# Patient Record
Sex: Female | Born: 1955 | Race: White | Hispanic: No | Smoking: Current every day smoker
Health system: Southern US, Community
[De-identification: ages and names within clinical notes are randomized; demographics above are authoritative.]

## PROBLEM LIST (undated history)

## (undated) DIAGNOSIS — F4310 Post-traumatic stress disorder, unspecified: Secondary | ICD-10-CM

## (undated) DIAGNOSIS — F1010 Alcohol abuse, uncomplicated: Secondary | ICD-10-CM

## (undated) DIAGNOSIS — Z79899 Other long term (current) drug therapy: Secondary | ICD-10-CM

## (undated) DIAGNOSIS — F419 Anxiety disorder, unspecified: Secondary | ICD-10-CM

## (undated) DIAGNOSIS — C799 Secondary malignant neoplasm of unspecified site: Secondary | ICD-10-CM

## (undated) DIAGNOSIS — K219 Gastro-esophageal reflux disease without esophagitis: Secondary | ICD-10-CM

## (undated) DIAGNOSIS — I10 Essential (primary) hypertension: Secondary | ICD-10-CM

## (undated) DIAGNOSIS — F17200 Nicotine dependence, unspecified, uncomplicated: Secondary | ICD-10-CM

## (undated) DIAGNOSIS — F329 Major depressive disorder, single episode, unspecified: Secondary | ICD-10-CM

## (undated) DIAGNOSIS — E785 Hyperlipidemia, unspecified: Secondary | ICD-10-CM

## (undated) DIAGNOSIS — M1990 Unspecified osteoarthritis, unspecified site: Secondary | ICD-10-CM

## (undated) DIAGNOSIS — F32A Depression, unspecified: Secondary | ICD-10-CM

## (undated) HISTORY — DX: Gastro-esophageal reflux disease without esophagitis: K21.9

## (undated) HISTORY — DX: Essential (primary) hypertension: I10

## (undated) HISTORY — DX: Depression, unspecified: F32.A

## (undated) HISTORY — DX: Post-traumatic stress disorder, unspecified: F43.10

## (undated) HISTORY — DX: Anxiety disorder, unspecified: F41.9

## (undated) HISTORY — DX: Major depressive disorder, single episode, unspecified: F32.9

## (undated) HISTORY — DX: Hyperlipidemia, unspecified: E78.5

## (undated) HISTORY — DX: Unspecified osteoarthritis, unspecified site: M19.90

---

## 1992-10-14 HISTORY — PX: TUBAL LIGATION: SHX77

## 1995-10-15 HISTORY — PX: BREAST BIOPSY: SHX20

## 1999-10-25 ENCOUNTER — Other Ambulatory Visit: Admission: RE | Admit: 1999-10-25 | Discharge: 1999-10-25 | Payer: Self-pay | Admitting: Obstetrics & Gynecology

## 2002-08-23 ENCOUNTER — Other Ambulatory Visit: Admission: RE | Admit: 2002-08-23 | Discharge: 2002-08-23 | Payer: Self-pay | Admitting: Obstetrics and Gynecology

## 2003-11-15 ENCOUNTER — Other Ambulatory Visit: Admission: RE | Admit: 2003-11-15 | Discharge: 2003-11-15 | Payer: Self-pay | Admitting: Gynecology

## 2005-10-14 HISTORY — PX: ANTERIOR AND POSTERIOR REPAIR: SHX1172

## 2006-05-12 ENCOUNTER — Other Ambulatory Visit: Admission: RE | Admit: 2006-05-12 | Discharge: 2006-05-12 | Payer: Self-pay | Admitting: *Deleted

## 2006-07-01 ENCOUNTER — Ambulatory Visit (HOSPITAL_COMMUNITY): Admission: RE | Admit: 2006-07-01 | Discharge: 2006-07-02 | Payer: Self-pay | Admitting: *Deleted

## 2007-06-18 ENCOUNTER — Other Ambulatory Visit: Admission: RE | Admit: 2007-06-18 | Discharge: 2007-06-18 | Payer: Self-pay | Admitting: *Deleted

## 2008-10-14 DIAGNOSIS — F4310 Post-traumatic stress disorder, unspecified: Secondary | ICD-10-CM

## 2008-10-14 HISTORY — DX: Post-traumatic stress disorder, unspecified: F43.10

## 2009-06-01 ENCOUNTER — Encounter: Payer: Self-pay | Admitting: Internal Medicine

## 2009-06-01 ENCOUNTER — Ambulatory Visit: Payer: Self-pay | Admitting: Obstetrics and Gynecology

## 2009-06-01 ENCOUNTER — Other Ambulatory Visit: Admission: RE | Admit: 2009-06-01 | Discharge: 2009-06-01 | Payer: Self-pay | Admitting: Obstetrics and Gynecology

## 2009-06-01 ENCOUNTER — Encounter: Payer: Self-pay | Admitting: Obstetrics and Gynecology

## 2009-06-14 ENCOUNTER — Telehealth (INDEPENDENT_AMBULATORY_CARE_PROVIDER_SITE_OTHER): Payer: Self-pay | Admitting: *Deleted

## 2009-07-14 ENCOUNTER — Ambulatory Visit: Payer: Self-pay | Admitting: Internal Medicine

## 2009-07-14 DIAGNOSIS — M1990 Unspecified osteoarthritis, unspecified site: Secondary | ICD-10-CM | POA: Insufficient documentation

## 2009-07-14 DIAGNOSIS — F429 Obsessive-compulsive disorder, unspecified: Secondary | ICD-10-CM | POA: Insufficient documentation

## 2009-07-14 DIAGNOSIS — E785 Hyperlipidemia, unspecified: Secondary | ICD-10-CM | POA: Insufficient documentation

## 2009-07-14 DIAGNOSIS — F411 Generalized anxiety disorder: Secondary | ICD-10-CM | POA: Insufficient documentation

## 2009-07-14 DIAGNOSIS — F4321 Adjustment disorder with depressed mood: Secondary | ICD-10-CM | POA: Insufficient documentation

## 2009-07-14 DIAGNOSIS — F329 Major depressive disorder, single episode, unspecified: Secondary | ICD-10-CM | POA: Insufficient documentation

## 2009-07-14 DIAGNOSIS — K219 Gastro-esophageal reflux disease without esophagitis: Secondary | ICD-10-CM | POA: Insufficient documentation

## 2009-07-14 DIAGNOSIS — F17200 Nicotine dependence, unspecified, uncomplicated: Secondary | ICD-10-CM | POA: Insufficient documentation

## 2009-09-18 ENCOUNTER — Telehealth: Payer: Self-pay | Admitting: Internal Medicine

## 2009-09-21 ENCOUNTER — Ambulatory Visit: Payer: Self-pay | Admitting: Internal Medicine

## 2009-09-21 DIAGNOSIS — R490 Dysphonia: Secondary | ICD-10-CM | POA: Insufficient documentation

## 2009-09-21 DIAGNOSIS — R079 Chest pain, unspecified: Secondary | ICD-10-CM | POA: Insufficient documentation

## 2009-09-21 DIAGNOSIS — F4310 Post-traumatic stress disorder, unspecified: Secondary | ICD-10-CM | POA: Insufficient documentation

## 2009-09-25 LAB — CONVERTED CEMR LAB
ALT: 19 units/L (ref 0–35)
AST: 19 units/L (ref 0–37)
Bilirubin, Direct: 0 mg/dL (ref 0.0–0.3)
CO2: 26 meq/L (ref 19–32)
Calcium: 9.3 mg/dL (ref 8.4–10.5)
Creatinine, Ser: 0.8 mg/dL (ref 0.4–1.2)
GFR calc non Af Amer: 80.18 mL/min (ref >60)
Glucose, Bld: 95 mg/dL (ref 70–99)
HDL: 54.1 mg/dL (ref >39.00)
Hemoglobin, Urine: NEGATIVE
Ketones, ur: NEGATIVE mg/dL
Leukocytes, UA: NEGATIVE
Sodium: 137 meq/L (ref 135–145)
Specific Gravity, Urine: <=1.005 (ref 1.000–1.030)
TSH: 0.84 microintl units/mL (ref 0.35–5.50)
Total Bilirubin: 0.3 mg/dL (ref 0.3–1.2)
Total CHOL/HDL Ratio: 5
Urine Glucose: NEGATIVE mg/dL
Urobilinogen, UA: 0.2 (ref 0.0–1.0)
VLDL: 16.2 mg/dL (ref 0.0–40.0)
Vitamin B-12: 1122 pg/mL — ABNORMAL HIGH (ref 211–911)

## 2009-10-30 ENCOUNTER — Telehealth: Payer: Self-pay | Admitting: Internal Medicine

## 2009-11-07 ENCOUNTER — Ambulatory Visit: Payer: Self-pay | Admitting: Internal Medicine

## 2009-11-07 DIAGNOSIS — L0293 Carbuncle, unspecified: Secondary | ICD-10-CM

## 2009-11-07 DIAGNOSIS — L0292 Furuncle, unspecified: Secondary | ICD-10-CM | POA: Insufficient documentation

## 2009-11-14 ENCOUNTER — Ambulatory Visit: Payer: Self-pay | Admitting: Internal Medicine

## 2010-07-31 ENCOUNTER — Telehealth: Payer: Self-pay | Admitting: Internal Medicine

## 2010-11-15 NOTE — Progress Notes (Signed)
  Phone Note Other Incoming   Caller: [phone] Summary of Call: Pt calling she c/o lower back pain with urinary freq and burning when urinating. She cannot afford to come in . Can pt have antibiotic sent to [facility] in [place]. Please Advise. Initial call taken by: Ami Bullins CMA,  July 31, 2010 10:16 AM  Follow-up for Phone Call        informed pt  Follow-up by: Ami Bullins CMA,  July 31, 2010 11:06 AM    New/Updated Medications: CIPRO 250 MG TAB (CIPROFLOXACIN HCL) Take 1 tablet by mouth morning and night X 5 days Prescriptions: CIPRO 250 MG TAB (CIPROFLOXACIN HCL) Take 1 tablet by mouth morning and night X 5 days  #10 x 1   Entered and Authorized by:   Etta Grandchild MD   Signed by:   Etta Grandchild MD on 07/31/2010   Method used:   Electronically to        [facility] [address] [phone]* (retail)       [address] [place], [place]  [address]       Ph: [phone]       Fax: [phone]   RxID:   [id number] CIPRO 250 MG TAB (CIPROFLOXACIN HCL) Take 1 tablet by mouth morning and night X 5 days  #10 x 1   Entered and Authorized by:   Etta Grandchild MD   Signed by:   Etta Grandchild MD on 07/31/2010   Method used:   Electronically to        [facility]  [address] (retail)       [address]       [place], [place]  [address]       Ph: [phone] or [phone]       Fax: [phone]   RxID:   [id number]

## 2010-11-15 NOTE — Miscellaneous (Signed)
Summary: [facility] D[facility]  [facility]   Imported By: Sherian Rein 11/16/2009 10:24:17  _____________________________________________________________________  External Attachment:    Type:   Image     Comment:   External Document

## 2010-11-15 NOTE — Progress Notes (Signed)
  Phone Note Call from Patient   Caller: [phone] Summary of Call: Patient is requesting a call regarding an apt. Message was unclear.  Initial call taken by: Lamar Sprinkles, CMA,  October 30, 2009 3:44 PM  Follow-up for Phone Call        Spoke with pt, she made apt, does not need anything further from our office.  Follow-up by: Lamar Sprinkles, CMA,  October 31, 2009 6:32 PM

## 2010-11-15 NOTE — Assessment & Plan Note (Signed)
Summary: f/yu appt/cd   Vital Signs:  Patient profile:   55 year old female Weight:      158 pounds Temp:     96.6 degrees F oral Pulse rate:   64 / minute BP sitting:   136 / 84  (left arm)  Vitals Entered By: Tora Perches (November 07, 2009 3:50 PM) CC: f/u Is Patient Diabetic? No   CC:  f/u.  History of Present Illness: The patient presents for a follow up of GERD, depression, hyperlipidemia.  Preventive Screening-Counseling & Management  Alcohol-Tobacco     Smoking Status: current  Current Medications (verified): 1)  Alprazolam 1 Mg Tabs (Alprazolam) .... Three Times A Day 2)  Prozac 40 Mg Caps (Fluoxetine Hcl) .... 60mg  Once Daily 3)  Ranitidine Hcl 150 Mg Caps (Ranitidine Hcl) .Marland Kitchen.. 1 Po Bid 4)  Aspirin 81 Mg  Tbec (Aspirin) .... One By Mouth Every Day 5)  Vitamin D3 1000 Unit  Tabs (Cholecalciferol) .Marland Kitchen.. 1 By Mouth Daily  Allergies (verified): No Known Drug Allergies  Past History:  Past Medical History: Last updated: 09/21/2009 Anxiety Depression/OCD GERD Osteoarthritis GYN-Dr Gottsegen Hyperlipidemia PTSD 2010  Social History: Last updated: 07/14/2009 Occupation: unemployed Widow/Widower Current Smoker Alcohol use-no Drug use-no Regular exercise-no  Family History: Reviewed history from 07/14/2009 and no changes required. F CAD GM breast ca M breast ca  Social History: Reviewed history from 07/14/2009 and no changes required. Occupation: unemployed Widow/Widower Current Smoker Alcohol use-no Drug use-no Regular exercise-no  Review of Systems       The patient complains of chest pain, prolonged cough, and depression.  The patient denies fever.    Physical Exam  General:  NAD Nose:  External nasal examination shows no deformity or inflammation. Nasal mucosa are pink and moist without lesions or exudates. Mouth:  Oral mucosa and oropharynx without lesions or exudates.  Teeth in good repair. Lungs:  CTA Heart:  WNL Abdomen:   S/NT Msk:  No deformity or scoliosis noted of thoracic or lumbar spine.   Neurologic:  No cranial nerve deficits noted. Station and gait are normal. Plantar reflexes are down-going bilaterally. DTRs are symmetrical throughout. Sensory, motor and coordinative functions appear intact. Skin:  1 cm soft furuncle R axilla Psych:  Oriented X3, normally interactive, good eye contact, not agitated, not suicidal, not homicidal, and depressed affect.     Impression & Recommendations:  Problem # 1:  HYPERLIPIDEMIA (ICD-272.4) Assessment Improved  She was not interested in meds, now agreed to try Lovastatin. Better on diet  Her updated medication list for this problem includes:    Lovastatin 20 Mg Tabs (Lovastatin) .Marland Kitchen... 1 by mouth once daily for cholesterol  Problem # 2:  DEPRESSION (ICD-311) Assessment: Unchanged  Her updated medication list for this problem includes:    Alprazolam 1 Mg Tabs (Alprazolam) .Marland Kitchen... Three times a day    Prozac 40 Mg Caps (Fluoxetine hcl) ..... 60mg  once daily  Problem # 3:  GRIEF REACTION (ICD-309.0) Assessment: Unchanged Discussed  Problem # 4:  GERD (ICD-530.81) Assessment: Deteriorated  Her updated medication list for this problem includes:    Ranitidine Hcl 150 Mg Caps (Ranitidine hcl) .Marland Kitchen... 1 po bid Risks of noncompliance with treatment discussed. Compliance encouraged.   Problem # 5:  FURUNCLE (ICD-680.9) R axilla Assessment: New I&D if not well by Friday Abx given  Problem # 6:  PTSD (ICD-309.81) Assessment: Unchanged  Complete Medication List: 1)  Alprazolam 1 Mg Tabs (Alprazolam) .... Three times a day 2)  Prozac 40 Mg Caps (Fluoxetine hcl) .... 60mg  once daily 3)  Ranitidine Hcl 150 Mg Caps (Ranitidine hcl) .Marland Kitchen.. 1 po bid 4)  Aspirin 81 Mg Tbec (Aspirin) .... One by mouth every day 5)  Vitamin D3 1000 Unit Tabs (Cholecalciferol) .Marland Kitchen.. 1 by mouth daily 6)  Lovastatin 20 Mg Tabs (Lovastatin) .Marland Kitchen.. 1 by mouth once daily for cholesterol 7)   Ceftin 500 Mg Tabs (Cefuroxime axetil) .Marland Kitchen.. 1 by mouth bid  Patient Instructions: 1)  Please schedule a follow-up appointment in 3 months. 2)  BMP prior to visit, ICD-9: 3)  Hepatic Panel prior to visit, ICD-9:272.0  995.20 4)  Lipid Panel prior to visit, ICD-9: Prescriptions: CEFTIN 500 MG TABS (CEFUROXIME AXETIL) 1 by mouth bid  #20 x 1   Entered and Authorized by:   Tresa Garter MD   Signed by:   Tresa Garter MD on 11/07/2009   Method used:   Print then Give to Patient   RxID:   [id number] LOVASTATIN 20 MG TABS (LOVASTATIN) 1 by mouth once daily for cholesterol  #30 x 12   Entered and Authorized by:   Tresa Garter MD   Signed by:   Tresa Garter MD on 11/07/2009   Method used:   Print then Give to Patient   RxID:   [id number] RANITIDINE HCL 150 MG CAPS (RANITIDINE HCL) 1 po bid  #60 x 12   Entered and Authorized by:   Tresa Garter MD   Signed by:   Tresa Garter MD on 11/07/2009   Method used:   Print then Give to Patient   RxID:   [id number]

## 2010-11-15 NOTE — Assessment & Plan Note (Signed)
Summary: BOIL IS NOT BETTER/ TOLD TO  COME BACK IN/NWS   Vital Signs:  Patient profile:   55 year old female Weight:      159 pounds Temp:     97.7 degrees F oral Pulse rate:   77 / minute BP sitting:   134 / 84  (left arm)  Vitals Entered By: Tora Perches (November 14, 2009 4:00 PM)  Procedure Note  Incision & Drainage: The patient complains of pain and redness. Onset of lesion: 1 week Consent signed: yes  Procedure # 1: I & D with packing    Size (in cm): 1.0 x 1.0    Region: posterior    Location: R axilla    Comment: Risks including but not limited by incomplete procedure, bleeding, infection, recurrence were discussed with the patient. Consent form was signed. After 1 cm incision was made - 1.5 cc of pus was removed. 2" packing was placed in the wound. Tolerated well. Complicatons - none. Good pain relief following the procedure.     Instrument used: #10 blade    Anesthesia: 1.0 ml 1% lidocaine w/epinephrine  Cleaned and prepped with: alcohol and betadine Wound dressing: neosporin and bulky gauze dressing Instructions: daily dressing changes Additional Instructions: remove packing in the next 2-3 d  CC: boil is not better Is Patient Diabetic? No   CC:  boil is not better.  History of Present Illness: F/u boil in R axilla - not better  Current Medications (verified): 1)  Alprazolam 1 Mg Tabs (Alprazolam) .... Three Times A Day 2)  Prozac 20 Mg Caps (Fluoxetine Hcl) .... Three Times A Day 3)  Ranitidine Hcl 150 Mg Caps (Ranitidine Hcl) .Marland Kitchen.. 1 Po Bid 4)  Aspirin 81 Mg  Tbec (Aspirin) .... One By Mouth Every Day 5)  Vitamin D3 1000 Unit  Tabs (Cholecalciferol) .Marland Kitchen.. 1 By Mouth Daily 6)  Lovastatin 20 Mg Tabs (Lovastatin) .Marland Kitchen.. 1 By Mouth Once Daily For Cholesterol 7)  Ceftin 500 Mg Tabs (Cefuroxime Axetil) .Marland Kitchen.. 1 By Mouth Bid  Allergies (verified): No Known Drug Allergies   Complete Medication List: 1)  Alprazolam 1 Mg Tabs (Alprazolam) .... Three times a  day 2)  Prozac 20 Mg Caps (Fluoxetine hcl) .... Three times a day 3)  Ranitidine Hcl 150 Mg Caps (Ranitidine hcl) .Marland Kitchen.. 1 po bid 4)  Aspirin 81 Mg Tbec (Aspirin) .... One by mouth every day 5)  Vitamin D3 1000 Unit Tabs (Cholecalciferol) .Marland Kitchen.. 1 by mouth daily 6)  Lovastatin 20 Mg Tabs (Lovastatin) .Marland Kitchen.. 1 by mouth once daily for cholesterol 7)  Ceftin 500 Mg Tabs (Cefuroxime axetil) .Marland Kitchen.. 1 by mouth bid  Other Orders: I&D Abscess, Complex (10061)  Patient Instructions: 1)  Finish the ntibiotic 2)  Call if you are not better in a reasonable amount of time or if worse.

## 2011-03-01 NOTE — Op Note (Signed)
Kimberly Adkins, VENTRONE NO.:  [id number]   MEDICAL RECORD NO.:  [id number]          Adkins TYPE:  AMB   LOCATION:  DAY                          FACILITY:  [facility]   PHYSICIAN:  Almedia Balls. Fore, M.D.   DATE OF BIRTH:  July 05, 1956   DATE OF PROCEDURE:  07/01/2006  DATE OF DISCHARGE:                                 OPERATIVE REPORT   PREOPERATIVE DIAGNOSES:  1. Stress urinary incontinence.  2. Cystocele.  3. Rectocele.  4. Probable enterocele.   POSTOPERATIVE DIAGNOSES:  1. Stress urinary incontinence.  2. Cystocele.  3. Rectocele.  4. Definite enterocele.   OPERATION:  1. Burch cystourethropexy.  2. Posterior colporrhaphy.  3. Enterocele repair.   ANESTHESIA:  General orotracheal.   OPERATOR:  Almedia Balls. Kimberly Adkins, M.D.   FIRST ASSISTANT:  Gretta Cool, M.D.   INDICATIONS FOR SURGERY:  The Adkins is a 55 year old with the above-noted  problems, who was counseled as to the need for surgery to treat these  problems and the type of surgery to be performed.  She was fully counseled  as to the nature of the procedure to include risks of anesthesia, injury to  uterus, tubes, ovaries, bowel, bladder, blood vessels, ureters,  postoperative hemorrhage or infection, recuperation and use of a catheter  postoperatively.  She fully understands all these considerations and has  signed informed consent to proceed on July 01, 2006.   OPERATIVE FINDINGS:  On examination, there is noted to be a moderate to  severe cystocele and rectocele, and after proceeding with the posterior  repair, there was found to be a moderate enterocele as well.   PROCEDURE:  With the Adkins under general anesthesia, prepared and draped  in the usual sterile fashion, with a Foley catheter in the bladder, a lower  abdominal transverse incision was made and carried through the fascial  layer.  The rectus abdominis and pyramidalis muscles were dissected  laterally with entry into the  retropubic space.  Interrupted sutures of 0  Prolene were placed in the paravesical and periurethral areas in the vagina  and attached to Cooper's fascia on the symphysis pubis; these sutures were  done bilaterally.  The sutures were tied down with the operator's finger in  the vagina and the assistant tying the sutures.  This provided good  elevation of the urethrovesical angle.  There are was observed for  hemorrhage and lavaged.  There was no hemorrhage occurring, so the fascia  was reapproximated with continuous sutures of 0 PDS, which were brought from  the lateral aspects of the incision and tied in the midline.  Subcutaneous  fat was reapproximated with interrupted horizontal mattress sutures of 0  PDS.  The skin was closed with a subcuticular suture of 3-0 plain catgut.   Attention was then directed to the vaginal portion of the procedure.  The  Adkins was repositioned, and Allis clamps were placed at the mucocutaneous  junctions and on the vaginal mucosa in the midline.  The area was injected  with a total of 8 mL of 0.5% lidocaine with 1:100,000 epinephrine for  hemostasis and delineation of fascial planes.  The mucocutaneous junction  tissue was excised, and an incision was made in the midportion of the  vagina, which was carried up to the level of the cervix and uterosacral  ligaments.  Gradual dissection was carried out to free up the underlying  scarred tissue and fascia.  The enterocele was encountered at the upper  limits of this dissection.  A suture of 0 Vicryl was placed to reduce the  enterocele.  Several imbricating sutures of the same material were likewise  used for reduction of the enterocele.  The levator ani fascia was then  reapproximated to reduce the rectocele with interrupted horizontal mattress  sutures and interrupted figure-of-eight sutures in an imbricating fashion.  The extra vaginal mucosa was then excised, and the vaginal mucosa was then   reapproximated and rendered hemostatic with a continuous interlocking suture  of 2-0 Vicryl.  After noting that hemostasis was maintained and that the  repair was adequate, the procedure was terminated.  Estimated blood loss --  100 mL.  The Adkins was taken to the recovery room in good condition with  clear urine in the Foley catheter tubing.   She will be placed on 23-hour observation or as an outpatient with extended  recovery status following surgery.           ______________________________  Almedia Balls Kimberly Adkins, M.D.     SRF/MEDQ  D:  07/01/2006  T:  07/02/2006  Job:  [id number]   cc:   Gretta Cool, M.D.  Fax: [phone]
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
## 2011-03-01 NOTE — Discharge Summary (Signed)
NAMEWILBA, MUTZ NO.:  [id number]   MEDICAL RECORD NO.:  [id number]          PATIENT TYPE:  OIB   LOCATION:  1429                         FACILITY:  [facility]   PHYSICIAN:  Almedia Balls. Fore, M.D.   DATE OF BIRTH:  1955/11/04   DATE OF ADMISSION:  07/01/2006  DATE OF DISCHARGE:  07/02/2006                                 DISCHARGE SUMMARY   HISTORY OF PRESENT ILLNESS:  The patient is a 55 year old with stress  urinary incontinence, cystocele, rectocele, and probable enterocele for  repairs on July 01, 2006. The remainder of her History and Physical are  as previously dictated.   LABORATORY DATA:  Urinary pregnancy test which was negative.  Electrocardiogram negative and within normal limits.   HOSPITAL COURSE:  The patient was taken to the operating room on July 01, 2006 at which time Burch cystourethropexy, posterior colporrhaphy,  enterocele repair were performed. The patient did well postoperatively. Diet  and ambulation were progressed over the evening of September 18 and morning  of September 19. On the morning of September 19, the patient was afebrile  and experiencing no problems except for pain which was controlled by oral  analgesics. The patient had voided a large amount prior to discharge. It was  felt she could be discharged at this time.   FINAL DIAGNOSES:  1. Stress urinary incontinence.  2. Cystocele, rectocele, enterocele operation, Burch cystourethropexy,      posterior colporrhaphy, enterocele repair. There are no pathology      reports.   DISPOSITION:  Discharged home to return to the office in two weeks for  follow-up. She was instructed to gradually progress her activities over  several weeks at home and to limit lifting and driving for two weeks. She  was fully ambulatory, on a regular diet, and in good condition at the time  of discharge. She was given prescription for hydrocodone/APAP 10/325 #30,  Urecholine 25 mg #28 one  q.i.d., and doxycycline 100 mg #12 to be taken one  b.i.d.           ______________________________  Almedia Balls. [name], M.D.     SRF/MEDQ  D:  07/02/2006  T:  07/03/2006  Job:  [id number]

## 2011-05-24 ENCOUNTER — Encounter: Payer: Self-pay | Admitting: Internal Medicine

## 2011-05-24 ENCOUNTER — Ambulatory Visit (INDEPENDENT_AMBULATORY_CARE_PROVIDER_SITE_OTHER): Payer: Self-pay | Admitting: Internal Medicine

## 2011-05-24 VITALS — BP 128/76 | HR 78 | Temp 98.2°F | Resp 16 | Wt 154.5 lb

## 2011-05-24 DIAGNOSIS — J209 Acute bronchitis, unspecified: Secondary | ICD-10-CM | POA: Insufficient documentation

## 2011-05-24 MED ORDER — CEFUROXIME AXETIL 500 MG PO TABS: 500.0000 mg | ORAL_TABLET | Freq: Two times a day (BID) | ORAL | Status: AC

## 2011-05-24 NOTE — Progress Notes (Signed)
Subjective:    Patient ID: Kimberly Adkins, female    DOB: 03-21-1956, 55 y.o.   MRN: [id number]  URI  This is a new problem. The current episode started in the past 7 days. The problem has been unchanged. There has been no fever. Associated symptoms include rhinorrhea, sinus pain, sneezing and a sore throat. Pertinent negatives include no abdominal pain, chest pain, congestion, coughing, diarrhea, dysuria, ear pain, headaches, joint pain, joint swelling, nausea, neck pain, plugged ear sensation, rash, swollen glands, vomiting or wheezing. She has tried nothing for the symptoms. The treatment provided no relief.      Review of Systems  Constitutional: Negative for fever, chills, diaphoresis, activity change, appetite change, fatigue and unexpected weight change.  HENT: Positive for sore throat, rhinorrhea, sneezing, postnasal drip and sinus pressure. Negative for ear pain, nosebleeds, congestion, facial swelling, trouble swallowing, neck pain, neck stiffness, voice change and ear discharge.   Eyes: Negative for photophobia, redness and visual disturbance.  Respiratory: Negative for apnea, cough, choking, chest tightness, shortness of breath, wheezing and stridor.   Cardiovascular: Negative for chest pain, palpitations and leg swelling.  Gastrointestinal: Negative for nausea, vomiting, abdominal pain, diarrhea and abdominal distention.  Genitourinary: Negative for dysuria.  Musculoskeletal: Negative for myalgias, back pain, joint pain, joint swelling, arthralgias and gait problem.  Skin: Negative for color change, pallor, rash and wound.  Neurological: Negative for dizziness, tremors, seizures, syncope, facial asymmetry, speech difficulty, weakness, light-headedness, numbness and headaches.  Hematological: Negative for adenopathy. Does not bruise/bleed easily.  Psychiatric/Behavioral: Negative.        Objective:   Physical Exam  Vitals reviewed. Constitutional: She is oriented to person,  place, and time. She appears well-developed and well-nourished. No distress.  HENT:  Head: No trismus in the jaw.  Right Ear: Hearing, tympanic membrane, external ear and ear canal normal.  Left Ear: Hearing, tympanic membrane, external ear and ear canal normal.  Nose: Mucosal edema and rhinorrhea present. No nose lacerations, sinus tenderness, nasal deformity, septal deviation or nasal septal hematoma. No epistaxis.  No foreign bodies. Right sinus exhibits no maxillary sinus tenderness and no frontal sinus tenderness. Left sinus exhibits no maxillary sinus tenderness and no frontal sinus tenderness.  Mouth/Throat: Oropharynx is clear and moist and mucous membranes are normal. Mucous membranes are not pale, not dry and not cyanotic. No uvula swelling. No oropharyngeal exudate, posterior oropharyngeal erythema or tonsillar abscesses.  Eyes: Conjunctivae and EOM are normal. Pupils are equal, round, and reactive to light. Right eye exhibits no discharge. Left eye exhibits no discharge. No scleral icterus.  Neck: Normal range of motion. Neck supple. No JVD present. No tracheal deviation present. No thyromegaly present.  Cardiovascular: Normal rate, regular rhythm, normal heart sounds and intact distal pulses.  Exam reveals no gallop and no friction rub.   No murmur heard. Pulmonary/Chest: Effort normal and breath sounds normal. No stridor. No respiratory distress. She has no wheezes. She has no rales. She exhibits no tenderness.  Abdominal: Soft. Bowel sounds are normal. She exhibits no distension and no mass. There is no tenderness. There is no rebound and no guarding.  Musculoskeletal: Normal range of motion. She exhibits no edema and no tenderness.  Lymphadenopathy:    She has no cervical adenopathy.  Neurological: She is alert and oriented to person, place, and time. She has normal reflexes. She displays normal reflexes. She exhibits normal muscle tone. Coordination normal.  Skin: Skin is warm and  dry. No rash noted. She is not  diaphoretic. No erythema. No pallor.  Psychiatric: She has a normal mood and affect. Her behavior is normal. Judgment and thought content normal.          Assessment & Plan:

## 2011-05-24 NOTE — Patient Instructions (Signed)
Acute Bronchitis You have acute bronchitis. This means you have a chest cold. The airways in your lungs are inflamed (red and sore). Acute means it is sudden onset. Bronchitis is most often caused by a virus. In smokers, people with chronic lung problems, and elderly patients, treatment with antibiotics for bacterial infection may be needed. Exposure to cigarette smoke or irritating chemicals will make bronchitis worse. Allergies and asthma can also make bronchitis worse. Repeated episodes of bronchitis may cause long standing lung problems. Acute bronchitis is usually treated with rest, fluids, and medicines for relief of fever or cough. Bronchodilator medicines from metered inhalers or a nebulizer may be used to help open up the small airways. This reduces shortness of breath and helps control cough. Antibiotics can be prescribed if you are more seriously ill or at risk. A cool air vaporizer may help thin bronchial secretions and make it easier to clear your chest. Increased fluids may also help. You must avoid smoking, even second hand exposure. If you are a cigarette smoker, consider using nicotine gum or skin patches to help control withdrawal symptoms. Recovery from bronchitis is often slow, but you should start feeling better after 2-3 days. Cough from bronchitis frequently lasts for 3-4 weeks.  SEEK IMMEDIATE MEDICAL CARE IF YOU DEVELOP:  Increased fever, chills, or chest pain.   Severe shortness of breath or bloody sputum.   Dehydration, fainting, repeated vomiting, severe headache.   No improvement after one week of proper treatment.  MAKE SURE YOU:   Understand these instructions.   Will watch your condition.   Will get help right away if you are not doing well or get worse.  Document Released: 11/07/2004 Document Re-Released: 09/12/2008 [employer] Patient Information 2011 [employer]. 

## 2011-05-24 NOTE — Assessment & Plan Note (Signed)
Start ceftin for the infection 

## 2011-06-04 ENCOUNTER — Telehealth: Payer: Self-pay

## 2011-06-04 NOTE — Telephone Encounter (Signed)
Left mess for patient to call back.  

## 2011-06-04 NOTE — Telephone Encounter (Signed)
Pt called requesting an appt with Dr Posey Rea only due to mild sxs of SOB she believes may be associated to chemical exposure at work.

## 2011-06-05 NOTE — Telephone Encounter (Signed)
Left mess for patient to call back.  

## 2011-06-07 NOTE — Telephone Encounter (Signed)
I called pt and she states she feels better now. Closing phone note.

## 2011-07-01 ENCOUNTER — Telehealth: Payer: Self-pay | Admitting: *Deleted

## 2011-07-01 MED ORDER — ACYCLOVIR 5 % EX OINT: TOPICAL_OINTMENT | CUTANEOUS | Status: DC

## 2011-07-01 NOTE — Telephone Encounter (Signed)
Patient requesting RX for fever blisters. Pt does not have insurance until October 1st and req RX w/o OV. Please advise.

## 2011-07-01 NOTE — Telephone Encounter (Signed)
Patient informed. 

## 2011-07-01 NOTE — Telephone Encounter (Signed)
acycl oint Thx

## 2011-08-15 ENCOUNTER — Telehealth: Payer: Self-pay | Admitting: *Deleted

## 2011-08-15 MED ORDER — AMOXICILLIN 500 MG PO CAPS: 1000.0000 mg | ORAL_CAPSULE | Freq: Two times a day (BID) | ORAL | Status: AC

## 2011-08-15 NOTE — Telephone Encounter (Signed)
Pt states she has an abcessed tooth and does not have a current dentist as hers has retired. Pt is requesting antibiotic be called to [facility] on [address]? Please advise.

## 2011-08-15 NOTE — Telephone Encounter (Signed)
Ok [place] See dentist Thx

## 2011-08-15 NOTE — Telephone Encounter (Signed)
Pt notified and advised of need to see dentist for further flare ups. Pt voices understanding.

## 2011-10-09 ENCOUNTER — Ambulatory Visit (INDEPENDENT_AMBULATORY_CARE_PROVIDER_SITE_OTHER): Payer: BC Managed Care – PPO | Admitting: Internal Medicine

## 2011-10-09 ENCOUNTER — Encounter: Payer: Self-pay | Admitting: Internal Medicine

## 2011-10-09 VITALS — BP 134/82 | HR 82 | Temp 97.2°F

## 2011-10-09 DIAGNOSIS — J029 Acute pharyngitis, unspecified: Secondary | ICD-10-CM

## 2011-10-09 DIAGNOSIS — J039 Acute tonsillitis, unspecified: Secondary | ICD-10-CM

## 2011-10-09 MED ORDER — AZITHROMYCIN 250 MG PO TABS: ORAL_TABLET | ORAL | Status: AC

## 2011-10-09 NOTE — Progress Notes (Signed)
  Subjective:    HPI  complains of sore throat Onset 48 hours ago, waxed and waned symptoms associated with moderate headache and low grade fever -both improved since taking Tylenol this morning Also myalgias,  -no sinus pressure or chest congestion/cough  relief with OTC meds for pain and fever as discussed but concerned about white plaques on left tonsil ?Precipitated by sick contacts  Past Medical History  Diagnosis Date  . Anxiety   . Depression   . GERD (gastroesophageal reflux disease)   . Hyperlipidemia   . Osteoarthritis   . PTSD (post-traumatic stress disorder) 2010    Review of Systems Constitutional: No night sweats, no unexpected weight change Pulmonary: No pleurisy or hemoptysis Cardiovascular: No chest pain or palpitations     Objective:   Physical Exam BP 134/82  Pulse 82  Temp(Src) 97.2 F (36.2 C) (Oral)  SpO2 96% GEN: mildly ill appearing and no audible head/chest congestion HENT: NCAT, mild sinus tenderness bilaterally, nares with clear discharge, oropharynx mod erythema and left tonsillar exudate Eyes: Vision grossly intact, no conjunctivitis Lungs: Clear to auscultation without rhonchi or wheeze, no increased work of breathing Cardiovascular: Regular rate and rhythm, no bilateral edema      Assessment & Plan:  Acute pharyngitis/tonsillitis - exudative on exam   Empiric antibiotics prescribed due to exudative tonsillitis on exam  Symptomatic care with Tylenol or Advil, hydration and rest -  salt gargle advised as needed

## 2011-10-09 NOTE — Patient Instructions (Signed)
It was good to see you today. Z-Pak antibiotics for your tonsillitis and pharyngitis symptoms Use warm salt gargle along with Tylenol or Advil as discussed for sore throat and headache symptoms  Salt Water Gargle This solution will help make your mouth and throat feel better. HOME CARE INSTRUCTIONS    Mix 1 teaspoon of salt in 8 ounces of warm water.     Gargle with this solution as much or often as you need or as directed. Swish and gargle gently if you have any sores or wounds in your mouth.     Do not swallow this mixture.  Document Released: 07/04/2004 Document Revised: 06/12/2011 Document Reviewed: 11/25/2008 [facility] Patient Information 2012 [place], [place].

## 2011-10-28 ENCOUNTER — Ambulatory Visit (INDEPENDENT_AMBULATORY_CARE_PROVIDER_SITE_OTHER): Payer: BC Managed Care – PPO | Admitting: Endocrinology

## 2011-10-28 ENCOUNTER — Encounter: Payer: Self-pay | Admitting: Endocrinology

## 2011-10-28 DIAGNOSIS — B001 Herpesviral vesicular dermatitis: Secondary | ICD-10-CM

## 2011-10-28 DIAGNOSIS — B009 Herpesviral infection, unspecified: Secondary | ICD-10-CM

## 2011-10-28 MED ORDER — VALACYCLOVIR HCL 500 MG PO TABS: 500.0000 mg | ORAL_TABLET | Freq: Two times a day (BID) | ORAL | Status: DC

## 2011-10-28 MED ORDER — DOXYCYCLINE HYCLATE 100 MG PO TABS: 100.0000 mg | ORAL_TABLET | Freq: Two times a day (BID) | ORAL | Status: AC

## 2011-10-28 NOTE — Progress Notes (Signed)
Subjective:    Patient ID: Kimberly Adkins, female    DOB: Nov 12, 1955, 56 y.o.   MRN: [id number]  HPI Pt states 1 day of slight "bump" at the left thumb, but no assoc fever.  She has taken valtrex, but sxs does not seem to help.   Past Medical History  Diagnosis Date  . Anxiety   . Depression   . GERD (gastroesophageal reflux disease)   . Hyperlipidemia   . Osteoarthritis   . PTSD (post-traumatic stress disorder) 2010    Past Surgical History  Procedure Date  . Bladder tuck 2007    History   Social History  . Marital Status: Widowed    Spouse Name: N/A    Number of Children: N/A  . Years of Education: N/A   Occupational History  . unemplyed    Social History Main Topics  . Smoking status: Current Everyday Smoker -- 1.5 packs/day for 22 years    Types: Cigarettes  . Smokeless tobacco: Not on file  . Alcohol Use: No  . Drug Use: No  . Sexually Active: Not on file   Other Topics Concern  . Not on file   Social History Narrative  . No narrative on file    Current Outpatient Prescriptions on File Prior to Visit  Medication Sig Dispense Refill  . ALPRAZolam (XANAX) 1 MG tablet Take 1 mg by mouth 3 (three) times daily as needed.        Marland Kitchen aspirin 81 MG tablet Take 81 mg by mouth daily.        . Cholecalciferol (VITAMIN D3) 1000 UNITS CAPS Take by mouth daily.        Marland Kitchen FLUoxetine (PROZAC) 20 MG capsule Take 20 mg by mouth 3 (three) times daily.        . ranitidine (ZANTAC) 150 MG tablet Take 150 mg by mouth 2 (two) times daily.          No Known Allergies  Family History  Problem Relation Age of Onset  . Cancer Mother     Breast cancer  . Coronary artery disease Father     BP 132/88  Pulse 81  Temp(Src) 98 F (36.7 C) (Oral)  SpO2 97%  Review of Systems She has recurrent fever blisters of the left lower lip.  She has anxiety, worsened by these sxs.    Objective:   Physical Exam VITAL SIGNS:  See vs page GENERAL: no distress Left lower lip: 1 cm  fever blister. Left thumb:  There is a 4 mm pustule at the extensor aspect of the ip joint.        Assessment & Plan:  Small pustule, new Herpes labialis, recurrent

## 2011-10-28 NOTE — Patient Instructions (Addendum)
i have sent a prescription to your pharmacy, for an antibiotic for your thumb. i have also sent a prescription to your pharmacy, for valacyclovir, to keep the fever blisters away. I hope you feel better soon.  If you don't feel better by next week, please call dr plotnikov.

## 2011-11-03 DIAGNOSIS — B001 Herpesviral vesicular dermatitis: Secondary | ICD-10-CM | POA: Insufficient documentation

## 2011-12-09 ENCOUNTER — Ambulatory Visit (INDEPENDENT_AMBULATORY_CARE_PROVIDER_SITE_OTHER): Payer: BC Managed Care – PPO | Admitting: Internal Medicine

## 2011-12-09 ENCOUNTER — Encounter: Payer: Self-pay | Admitting: Internal Medicine

## 2011-12-09 DIAGNOSIS — F17200 Nicotine dependence, unspecified, uncomplicated: Secondary | ICD-10-CM

## 2011-12-09 DIAGNOSIS — J40 Bronchitis, not specified as acute or chronic: Secondary | ICD-10-CM | POA: Insufficient documentation

## 2011-12-09 DIAGNOSIS — J449 Chronic obstructive pulmonary disease, unspecified: Secondary | ICD-10-CM

## 2011-12-09 DIAGNOSIS — F4310 Post-traumatic stress disorder, unspecified: Secondary | ICD-10-CM

## 2011-12-09 MED ORDER — BECLOMETHASONE DIPROPIONATE 80 MCG/ACT NA AERS: 1.0000 | INHALATION_SPRAY | Freq: Every day | NASAL | Status: DC

## 2011-12-09 MED ORDER — CEFUROXIME AXETIL 500 MG PO TABS: 500.0000 mg | ORAL_TABLET | Freq: Two times a day (BID) | ORAL | Status: DC

## 2011-12-09 MED ORDER — PROMETHAZINE-CODEINE 6.25-10 MG/5ML PO SYRP: 5.0000 mL | ORAL_SOLUTION | ORAL | Status: AC | PRN

## 2011-12-09 MED ORDER — FLUTICASONE-SALMETEROL 100-50 MCG/DOSE IN AEPB: 1.0000 | INHALATION_SPRAY | Freq: Two times a day (BID) | RESPIRATORY_TRACT | Status: DC

## 2011-12-09 NOTE — Patient Instructions (Signed)
Use over-the-counter  "cold" medicines  such as "Afrin" nasal spray for nasal congestion as directed instead. Use" Delsym" or" Robitussin" cough syrup varietis for cough.  You can use plain "Tylenol" or "Advi"l for fever, chills and achyness.   Do not smoke

## 2011-12-09 NOTE — Assessment & Plan Note (Signed)
Refractory 1.5 ppd  2/13

## 2011-12-09 NOTE — Assessment & Plan Note (Signed)
Depo 120 mg im Advair

## 2011-12-09 NOTE — Assessment & Plan Note (Signed)
Discussed.

## 2011-12-09 NOTE — Progress Notes (Signed)
Subjective:    Patient ID: Kimberly Adkins, female    DOB: 1956/01/29, 56 y.o.   MRN: [id number]  HPI   HPI  C/o URI sx's x  7 days. C/o ST, cough, weakness. Not better with 3 d Zpac and OTC medicines. Actually, the patient is getting worse. The patient did not sleep last night due to cough.  Review of Systems  Constitutional: Positive for fever, chills and fatigue.  HENT: Positive for congestion, rhinorrhea, sneezing and postnasal drip.   Eyes: Positive for photophobia and pain. Negative for discharge and visual disturbance.  Respiratory: Positive for cough and wheezing.   Positive for chest pain.  Gastrointestinal: Negative for vomiting, abdominal pain, diarrhea and abdominal distention.  Genitourinary: Negative for dysuria and difficulty urinating.  Skin: Negative for rash.  Neurological: Positive for dizziness, weakness and light-headedness.      Review of Systems     Objective:   Physical Exam  Constitutional: She appears well-developed. No distress.  HENT:  Head: Normocephalic.  Right Ear: External ear normal.  Left Ear: External ear normal.  Nose: Nose normal.       eryth throat  Eyes: Conjunctivae are normal. Pupils are equal, round, and reactive to light. Right eye exhibits no discharge. Left eye exhibits no discharge.  Neck: Normal range of motion. Neck supple. No JVD present. No tracheal deviation present. No thyromegaly present.  Cardiovascular: Normal rate, regular rhythm and normal heart sounds.   Pulmonary/Chest: No stridor. No respiratory distress. She has wheezes.  Abdominal: Soft. Bowel sounds are normal. She exhibits no distension and no mass. There is no tenderness. There is no rebound and no guarding.  Musculoskeletal: She exhibits no edema and no tenderness.  Lymphadenopathy:    She has no cervical adenopathy.  Neurological: She displays normal reflexes. No cranial nerve deficit. She exhibits normal muscle tone. Coordination normal.  Skin: No rash  noted. No erythema.  Psychiatric: She has a normal mood and affect. Her behavior is normal. Judgment and thought content normal.          Assessment & Plan:

## 2011-12-09 NOTE — Assessment & Plan Note (Signed)
Ceftin x 10 d Prom-cod She declined CXR

## 2011-12-18 ENCOUNTER — Ambulatory Visit (INDEPENDENT_AMBULATORY_CARE_PROVIDER_SITE_OTHER)
Admission: RE | Admit: 2011-12-18 | Discharge: 2011-12-18 | Disposition: A | Discharge status: Home / Self Care | Payer: BC Managed Care – PPO | Source: Ambulatory Visit | Attending: Endocrinology | Admitting: Endocrinology

## 2011-12-18 ENCOUNTER — Ambulatory Visit (INDEPENDENT_AMBULATORY_CARE_PROVIDER_SITE_OTHER): Payer: BC Managed Care – PPO | Admitting: Endocrinology

## 2011-12-18 ENCOUNTER — Encounter: Payer: Self-pay | Admitting: Endocrinology

## 2011-12-18 VITALS — BP 142/78 | HR 76 | Temp 97.6°F | Ht 63.5 in | Wt 159.1 lb

## 2011-12-18 DIAGNOSIS — R059 Cough, unspecified: Secondary | ICD-10-CM

## 2011-12-18 DIAGNOSIS — R05 Cough: Secondary | ICD-10-CM

## 2011-12-18 DIAGNOSIS — R079 Chest pain, unspecified: Secondary | ICD-10-CM

## 2011-12-18 DIAGNOSIS — J449 Chronic obstructive pulmonary disease, unspecified: Secondary | ICD-10-CM

## 2011-12-18 NOTE — Patient Instructions (Addendum)
Let's check a chest-x-ray.  please call [phone] to hear your test results.  You will be prompted to enter the 9-digit "MRN" number that appears at the top left of this page, followed by #.  Then you will hear the message.  I hope you feel better soon.  If you don't feel better by next week, please call dr plotnikov.   (update: i left message on phone-tree:  rx as we discussed)

## 2011-12-18 NOTE — Progress Notes (Signed)
Subjective:    Patient ID: Kimberly Adkins, female    DOB: 1956/05/29, 56 y.o.   MRN: [id number]  HPI Pt states few weeks of slight prod-quality cough in the chest, and assoc pain.  She says she feels slightly better overall since she saw dr plotnikov recently.   Past Medical History  Diagnosis Date  . Anxiety   . Depression   . GERD (gastroesophageal reflux disease)   . Hyperlipidemia   . Osteoarthritis   . PTSD (post-traumatic stress disorder) 2010    Past Surgical History  Procedure Date  . Bladder tuck 2007    History   Social History  . Marital Status: Widowed    Spouse Name: N/A    Number of Children: N/A  . Years of Education: N/A   Occupational History  . unemplyed    Social History Main Topics  . Smoking status: Current Everyday Smoker -- 1.5 packs/day for 22 years    Types: Cigarettes  . Smokeless tobacco: Not on file  . Alcohol Use: No  . Drug Use: No  . Sexually Active: Not on file   Other Topics Concern  . Not on file   Social History Narrative  . No narrative on file    Current Outpatient Prescriptions on File Prior to Visit  Medication Sig Dispense Refill  . ALPRAZolam (XANAX) 1 MG tablet Take 1 mg by mouth 3 (three) times daily as needed.        Marland Kitchen aspirin 81 MG tablet Take 81 mg by mouth daily.        . Beclomethasone Dipropionate (QNASL) 80 MCG/ACT AERS Place 1 Act into the nose daily.  1 Inhaler  3  . cefUROXime (CEFTIN) 500 MG tablet Take 1 tablet (500 mg total) by mouth 2 (two) times daily.  20 tablet  1  . Cholecalciferol (VITAMIN D3) 1000 UNITS CAPS Take by mouth daily.        Marland Kitchen FLUoxetine (PROZAC) 20 MG capsule Take 20 mg by mouth 3 (three) times daily.        . Fluticasone-Salmeterol (ADVAIR DISKUS) 100-50 MCG/DOSE AEPB Inhale 1 puff into the lungs 2 (two) times daily.  1 each  3  . ranitidine (ZANTAC) 150 MG tablet Take 150 mg by mouth 2 (two) times daily.        . valACYclovir (VALTREX) 500 MG tablet Take 1 tablet (500 mg total) by  mouth 2 (two) times daily.  30 tablet  11    No Known Allergies  Family History  Problem Relation Age of Onset  . Cancer Mother     Breast cancer  . Coronary artery disease Father     BP 142/78  Pulse 76  Temp(Src) 97.6 F (36.4 C) (Oral)  Ht 5' 3.5" (1.613 m)  Wt 159 lb 1.9 oz (72.176 kg)  BMI 27.74 kg/m2  SpO2 99%   Review of Systems Pt also reports pain at various parts of the body.  denies sob    Objective:   Physical Exam VITAL SIGNS:  See vs page GENERAL: no distress LUNGS:  Clear to auscultation HEART:  Regular rate and rhythm without murmurs noted. Normal S1,S2.      i reviewed electrocardiogram, cxr result, and spirometry.      Assessment & Plan:  Cough, uncertain etiology

## 2012-01-16 ENCOUNTER — Encounter: Payer: Self-pay | Admitting: Gynecology

## 2012-01-16 ENCOUNTER — Encounter: Payer: Self-pay | Admitting: Obstetrics and Gynecology

## 2012-01-21 ENCOUNTER — Ambulatory Visit (INDEPENDENT_AMBULATORY_CARE_PROVIDER_SITE_OTHER): Payer: BC Managed Care – PPO | Admitting: Internal Medicine

## 2012-01-21 ENCOUNTER — Encounter: Payer: Self-pay | Admitting: Internal Medicine

## 2012-01-21 VITALS — BP 158/90 | HR 84 | Temp 97.4°F | Resp 16 | Wt 160.0 lb

## 2012-01-21 DIAGNOSIS — R030 Elevated blood-pressure reading, without diagnosis of hypertension: Secondary | ICD-10-CM

## 2012-01-21 DIAGNOSIS — IMO0001 Reserved for inherently not codable concepts without codable children: Secondary | ICD-10-CM | POA: Insufficient documentation

## 2012-01-21 DIAGNOSIS — Z0000 Encounter for general adult medical examination without abnormal findings: Secondary | ICD-10-CM

## 2012-01-21 DIAGNOSIS — F4310 Post-traumatic stress disorder, unspecified: Secondary | ICD-10-CM

## 2012-01-21 DIAGNOSIS — K921 Melena: Secondary | ICD-10-CM | POA: Insufficient documentation

## 2012-01-21 MED ORDER — HYDROCHLOROTHIAZIDE 12.5 MG PO CAPS: 12.5000 mg | ORAL_CAPSULE | Freq: Every day | ORAL | Status: DC

## 2012-01-21 MED ORDER — HYDROCORTISONE 2.5 % RE CREA: TOPICAL_CREAM | RECTAL | Status: AC

## 2012-01-21 NOTE — Assessment & Plan Note (Addendum)
NAS Start HCTZ Labs

## 2012-01-21 NOTE — Progress Notes (Signed)
Subjective:    Patient ID: Kimberly Adkins, female    DOB: 1956-05-13, 56 y.o.   MRN: [id number]  HPI C/ red blood on tissue x 2-3 wks Elev BP today; h/o high chol  BP Readings from Last 3 Encounters:  01/21/12 158/90  12/18/11 142/78  12/09/11 148/90   Wt Readings from Last 3 Encounters:  01/21/12 160 lb (72.576 kg)  12/18/11 159 lb 1.9 oz (72.176 kg)  12/09/11 161 lb (73.029 kg)      Review of Systems  Constitutional: Negative for chills, activity change, appetite change, fatigue and unexpected weight change.  HENT: Negative for congestion, mouth sores and sinus pressure.   Eyes: Negative for visual disturbance.  Respiratory: Negative for cough and chest tightness.   Gastrointestinal: Positive for anal bleeding. Negative for nausea and abdominal pain.  Genitourinary: Negative for frequency, difficulty urinating and vaginal pain.  Musculoskeletal: Negative for back pain and gait problem.  Skin: Negative for pallor and rash.  Neurological: Negative for dizziness, tremors, weakness, numbness and headaches.  Psychiatric/Behavioral: Negative for confusion and sleep disturbance.       Objective:   Physical Exam  Constitutional: She appears well-developed. No distress.  HENT:  Head: Normocephalic.  Right Ear: External ear normal.  Left Ear: External ear normal.  Nose: Nose normal.  Mouth/Throat: Oropharynx is clear and moist.  Eyes: Conjunctivae are normal. Pupils are equal, round, and reactive to light. Right eye exhibits no discharge. Left eye exhibits no discharge.  Neck: Normal range of motion. Neck supple. No JVD present. No tracheal deviation present. No thyromegaly present.  Cardiovascular: Normal rate, regular rhythm and normal heart sounds.   Pulmonary/Chest: No stridor. No respiratory distress. She has no wheezes.  Abdominal: Soft. Bowel sounds are normal. She exhibits no distension and no mass. There is no tenderness. There is no rebound and no guarding.    Musculoskeletal: She exhibits no edema and no tenderness.  Lymphadenopathy:    She has no cervical adenopathy.  Neurological: She displays normal reflexes. No cranial nerve deficit. She exhibits normal muscle tone. Coordination normal.  Skin: No rash noted. No erythema.  Psychiatric: She has a normal mood and affect. Her behavior is normal. Judgment and thought content normal.    Procedure: Anoscopy Indication: blood in stool Risks and benefits were explained to pt in detail. He was placed in lateral decubitus position. Digital rectal exam was normal  . Stool was guaiac negative. . Anoscope was introduced without difficulty. No masses. Upon withdrawal normal mucosa was observed. Small internal hemorrhoids.  There was a  0.5 cm fissure at 12 o'clock. Impression: a  0.5 cm fissure at 12 o'clock. Tolerated well. Complications - none.       Assessment & Plan:

## 2012-01-21 NOTE — Assessment & Plan Note (Signed)
Continue with current prescription therapy as reflected on the Med list.  

## 2012-01-21 NOTE — Assessment & Plan Note (Signed)
4/13 - anoscopy: a  0.5 cm fissure at 12 o'clock. Colonoscopy Dr Charlyne Petrin [facility]

## 2012-01-22 ENCOUNTER — Ambulatory Visit (INDEPENDENT_AMBULATORY_CARE_PROVIDER_SITE_OTHER): Payer: BC Managed Care – PPO | Admitting: Obstetrics and Gynecology

## 2012-01-22 ENCOUNTER — Encounter: Payer: Self-pay | Admitting: Obstetrics and Gynecology

## 2012-01-22 ENCOUNTER — Other Ambulatory Visit (INDEPENDENT_AMBULATORY_CARE_PROVIDER_SITE_OTHER): Payer: BC Managed Care – PPO

## 2012-01-22 ENCOUNTER — Other Ambulatory Visit (HOSPITAL_COMMUNITY)
Admission: RE | Admit: 2012-01-22 | Discharge: 2012-01-22 | Disposition: A | Discharge status: Home / Self Care | Payer: BC Managed Care – PPO | Source: Ambulatory Visit | Attending: Obstetrics and Gynecology | Admitting: Obstetrics and Gynecology

## 2012-01-22 VITALS — BP 140/90 | Ht 64.0 in | Wt 159.0 lb

## 2012-01-22 DIAGNOSIS — IMO0001 Reserved for inherently not codable concepts without codable children: Secondary | ICD-10-CM

## 2012-01-22 DIAGNOSIS — Z0000 Encounter for general adult medical examination without abnormal findings: Secondary | ICD-10-CM

## 2012-01-22 DIAGNOSIS — N951 Menopausal and female climacteric states: Secondary | ICD-10-CM

## 2012-01-22 DIAGNOSIS — F4310 Post-traumatic stress disorder, unspecified: Secondary | ICD-10-CM

## 2012-01-22 DIAGNOSIS — K921 Melena: Secondary | ICD-10-CM

## 2012-01-22 DIAGNOSIS — Z01419 Encounter for gynecological examination (general) (routine) without abnormal findings: Secondary | ICD-10-CM

## 2012-01-22 DIAGNOSIS — I10 Essential (primary) hypertension: Secondary | ICD-10-CM | POA: Insufficient documentation

## 2012-01-22 DIAGNOSIS — Z780 Asymptomatic menopausal state: Secondary | ICD-10-CM

## 2012-01-22 DIAGNOSIS — R030 Elevated blood-pressure reading, without diagnosis of hypertension: Secondary | ICD-10-CM

## 2012-01-22 LAB — URINALYSIS, ROUTINE W REFLEX MICROSCOPIC
Ketones, ur: NEGATIVE
Specific Gravity, Urine: <=1.005 (ref 1.000–1.030)
pH: 6.5 (ref 5.0–8.0)

## 2012-01-22 LAB — CBC WITH DIFFERENTIAL/PLATELET
Basophils Relative: 1.5 % (ref 0.0–3.0)
Eosinophils Absolute: 0.1 10*3/uL (ref 0.0–0.7)
Lymphocytes Relative: 26.1 % (ref 12.0–46.0)
MCHC: 33.9 g/dL (ref 30.0–36.0)
Monocytes Relative: 5.6 % (ref 3.0–12.0)
Neutrophils Relative %: 65.9 % (ref 43.0–77.0)
RBC: 4.21 Mil/uL (ref 3.87–5.11)
WBC: 9.9 10*3/uL (ref 4.5–10.5)

## 2012-01-22 LAB — BASIC METABOLIC PANEL
CO2: 27 mEq/L (ref 19–32)
Calcium: 9.3 mg/dL (ref 8.4–10.5)
Creatinine, Ser: 0.8 mg/dL (ref 0.4–1.2)
GFR: 79.46 mL/min (ref >60.00)
Sodium: 138 mEq/L (ref 135–145)

## 2012-01-22 LAB — LIPID PANEL
HDL: 52.1 mg/dL (ref >39.00)
Triglycerides: 227 mg/dL — ABNORMAL HIGH (ref 0.0–149.0)
VLDL: 45.4 mg/dL — ABNORMAL HIGH (ref 0.0–40.0)

## 2012-01-22 LAB — TSH: TSH: 1.69 u[IU]/mL (ref 0.35–5.50)

## 2012-01-22 LAB — PROTIME-INR: INR: 1 ratio (ref 0.8–1.0)

## 2012-01-22 LAB — HEPATIC FUNCTION PANEL
ALT: 16 U/L (ref 0–35)
Total Bilirubin: 0.4 mg/dL (ref 0.3–1.2)
Total Protein: 7.6 g/dL (ref 6.0–8.3)

## 2012-01-22 NOTE — Progress Notes (Signed)
Patient came to see me today for her annual GYN exam. We last saw her her in August of 2010. She still has some hot flashes but she feels they're tolerable without medication. She is having no vaginal bleeding. She is having no pelvic pain. She does have a mammogram. She saw her PCP this week and he started her on antihypertensive medication. She has to go back there for lab work. She is bothered with sebaceous cysts in the vaginal area and has one that is tender currently. Her mother had breast cancer at 65 and is now deceased. Her maternal first cousin once removed had breast cancer at 62. There is no family history of ovarian cancer. Patient had a history of uterine prolapse and said that Dr. [name] did  a uterine suspension. She is currently asymptomatic. She's never had a bone density.  Physical examination: HEENT within normal limits. Neck: Thyroid not large. No masses. Supraclavicular nodes: not enlarged. Breasts: Examined in both sitting and lying  position. No skin changes and no masses. Abdomen: Soft no guarding rebound or masses or hernia. Pelvic: multiple sebaceous cysts on both labia. The one that is currently bothering her looks normal without cellulitis or any sign of infection. BUS: Within normal limits. Vaginal:within normal limits. Good estrogen effect. No evidence of cystocele rectocele or enterocele. Cervix: clean. Uterus: Normal size and shape with almost second-degree prolapse. Adnexa: No masses. Rectovaginal exam: Confirmatory and negative. Extremities: Within normal limits.  Assessment: #1. Menopausal symptoms #2. External genitalia sebaceous cysts #3. Recurrent uterine prolapse-asymptomatic #4. Family history of early onset breast cancer.  Plan: Continue yearly mammograms. Discussed BRCA1 and BRCA2 testing. Patient to check with insurance regarding coverage. Bone density scheduled. No treatment for uterine prolapse. Reassured about sebaceous cysts. Warm soaks to one  that is  tender.

## 2012-01-22 NOTE — Patient Instructions (Signed)
Check with your insurance to see if they cover BRCA1 and BRCA2 testing. Let  me now what they said they.

## 2012-01-23 ENCOUNTER — Telehealth: Payer: Self-pay | Admitting: Internal Medicine

## 2012-01-23 LAB — URINALYSIS W MICROSCOPIC + REFLEX CULTURE
Bacteria, UA: NONE SEEN
Casts: NONE SEEN
Glucose, UA: NEGATIVE mg/dL
Hgb urine dipstick: NEGATIVE
Ketones, ur: NEGATIVE mg/dL
Protein, ur: NEGATIVE mg/dL
pH: 6.5 (ref 5.0–8.0)

## 2012-01-23 NOTE — Telephone Encounter (Signed)
Misty Stanley, please, inform patient that all labs are normal except for elev chol Keep ROV. Low fat diet Thx

## 2012-01-24 LAB — URINE CULTURE

## 2012-01-24 NOTE — Telephone Encounter (Signed)
Pt informed

## 2012-05-27 IMAGING — CR DG CHEST 2V
2 series · 2 of 2 positions shown · non-contrast
Comparison: 09/21/2009.

CLINICAL DATA: Left chest pain, cough.

CHEST - 2 VIEW

[view not recorded (1 of 2)]
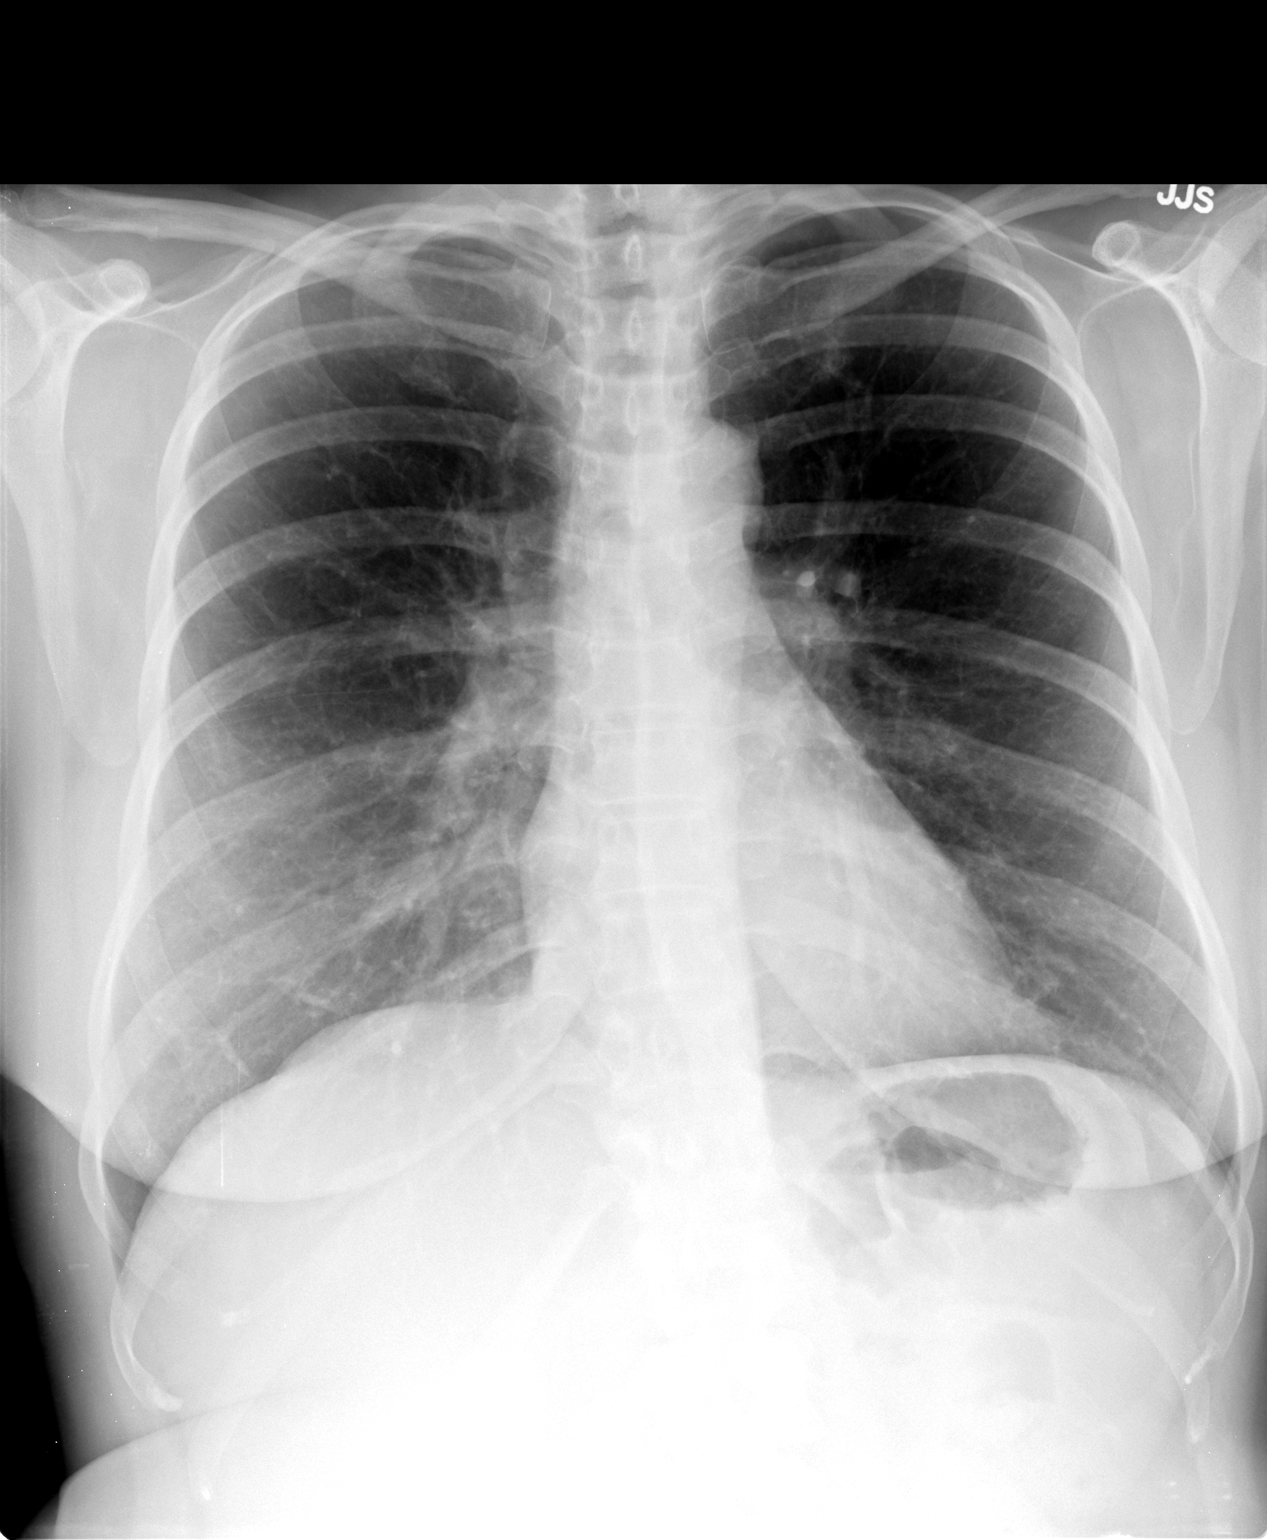

[view not recorded (2 of 2)]
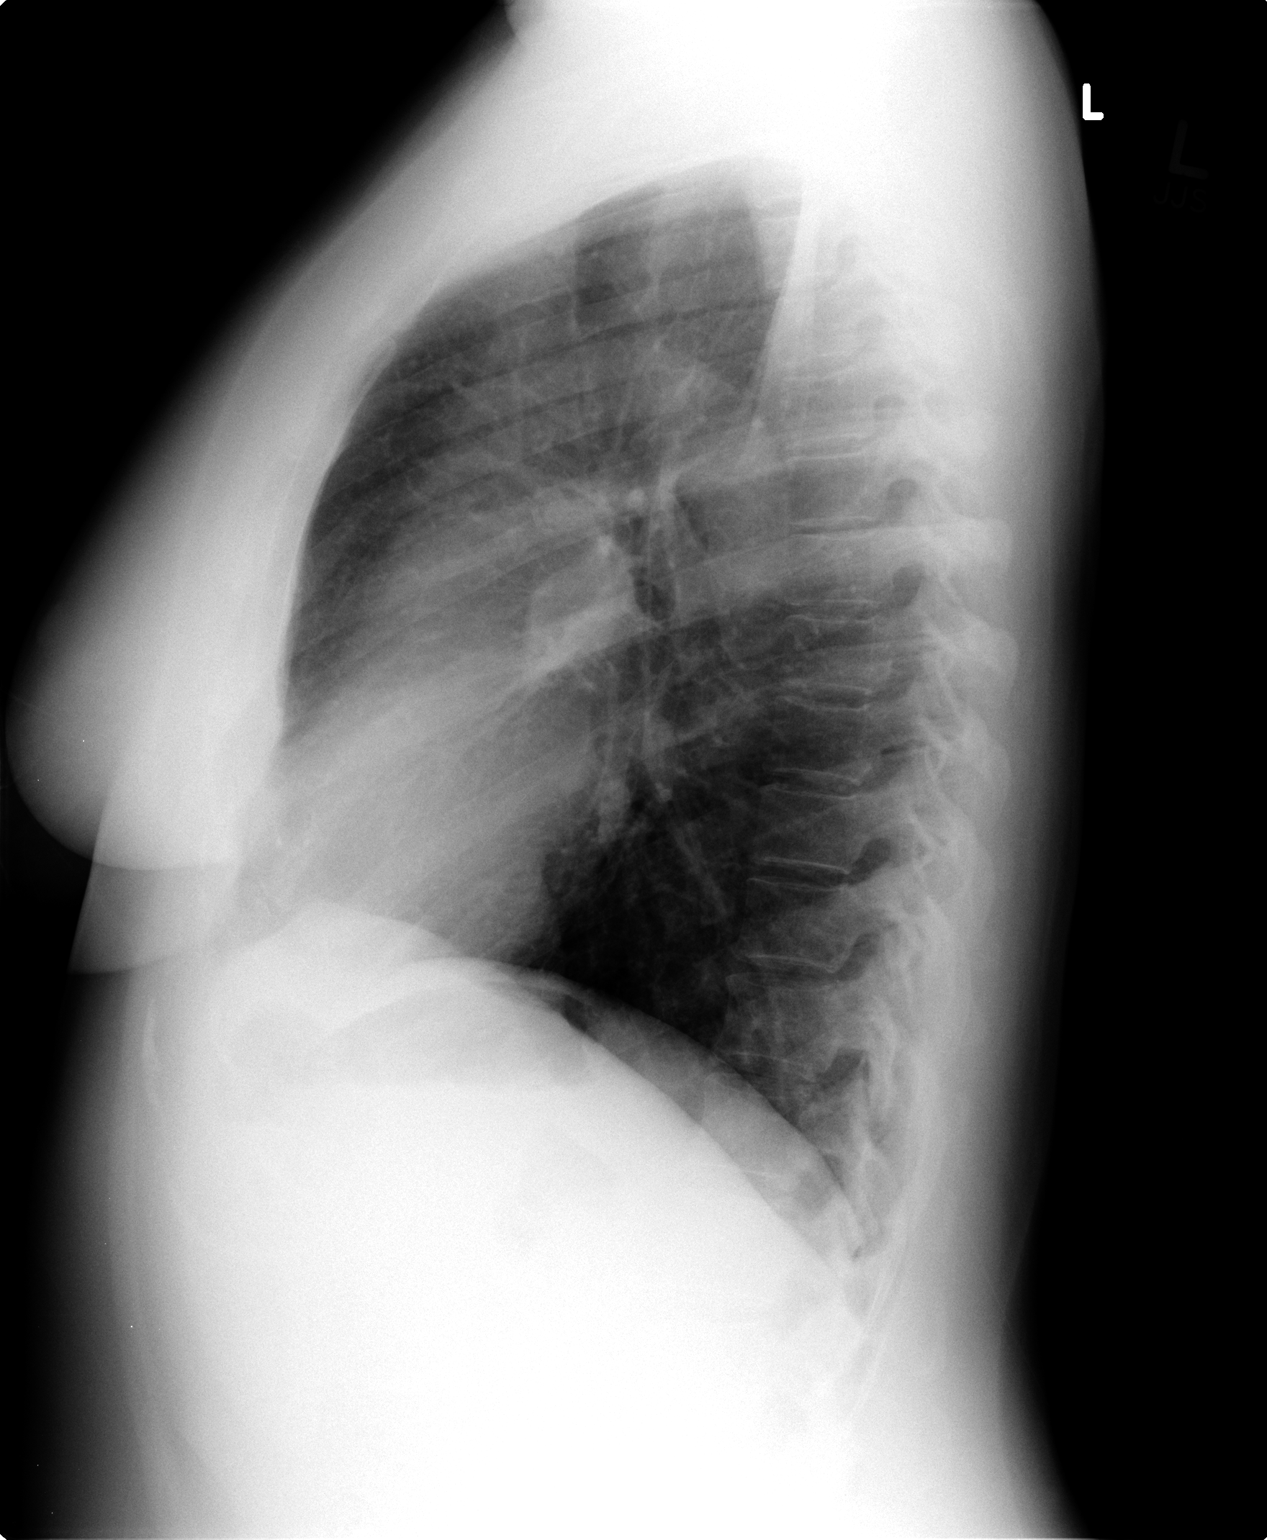

[2 of 2 positions shown; findings below may reference images not displayed]

FINDINGS: Heart and mediastinal contours are within normal limits.
No focal opacities or effusions.  No acute bony abnormality.
IMPRESSION: No active cardiopulmonary disease.

## 2012-06-03 ENCOUNTER — Telehealth: Payer: Self-pay | Admitting: Internal Medicine

## 2012-06-03 NOTE — Telephone Encounter (Signed)
Caller: Suellen/Patient; Patient Name: Kimberly Adkins; PCP: Sonda Primes; Best Callback Phone Number: [phone]. Patient calling to report she has sore throat with headache and cough. Patient reports she "Knows I'm getting bronchitis." Afebrile. Reports she is coughing up white sputum. Emergent symptom of "Known chronic lung disease (patient reports chronic bronchitis) and has developed upper respiratory infection symptoms" positive per Cough - Adult guideline. Disposition: See Provider within 24 hours. Patient very adamant about wanting a prescription called in instead of having to come to the office for an appointment. Please call the patient back and let her know if a prescription can be called in.

## 2012-06-03 NOTE — Telephone Encounter (Signed)
I could see her at 1 pm tomorrow Thx

## 2012-06-04 ENCOUNTER — Ambulatory Visit (INDEPENDENT_AMBULATORY_CARE_PROVIDER_SITE_OTHER): Payer: BC Managed Care – PPO | Admitting: Internal Medicine

## 2012-06-04 ENCOUNTER — Encounter: Payer: Self-pay | Admitting: Internal Medicine

## 2012-06-04 VITALS — BP 138/72 | HR 80 | Temp 97.0°F | Wt 166.0 lb

## 2012-06-04 DIAGNOSIS — M79641 Pain in right hand: Secondary | ICD-10-CM

## 2012-06-04 DIAGNOSIS — J449 Chronic obstructive pulmonary disease, unspecified: Secondary | ICD-10-CM

## 2012-06-04 DIAGNOSIS — R030 Elevated blood-pressure reading, without diagnosis of hypertension: Secondary | ICD-10-CM

## 2012-06-04 DIAGNOSIS — IMO0001 Reserved for inherently not codable concepts without codable children: Secondary | ICD-10-CM

## 2012-06-04 DIAGNOSIS — M79609 Pain in unspecified limb: Secondary | ICD-10-CM

## 2012-06-04 DIAGNOSIS — I10 Essential (primary) hypertension: Secondary | ICD-10-CM

## 2012-06-04 DIAGNOSIS — F17200 Nicotine dependence, unspecified, uncomplicated: Secondary | ICD-10-CM

## 2012-06-04 DIAGNOSIS — J40 Bronchitis, not specified as acute or chronic: Secondary | ICD-10-CM

## 2012-06-04 MED ORDER — DICLOFENAC SODIUM 1.5 % TD SOLN: 5.0000 [drp] | Freq: Four times a day (QID) | TRANSDERMAL | Status: DC | PRN

## 2012-06-04 MED ORDER — RANITIDINE HCL 150 MG PO TABS: 150.0000 mg | ORAL_TABLET | Freq: Two times a day (BID) | ORAL | Status: DC

## 2012-06-04 MED ORDER — AMOXICILLIN 500 MG PO CAPS: 1000.0000 mg | ORAL_CAPSULE | Freq: Two times a day (BID) | ORAL | Status: AC

## 2012-06-04 NOTE — Assessment & Plan Note (Signed)
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

## 2012-06-04 NOTE — Assessment & Plan Note (Signed)
Stop smoking

## 2012-06-04 NOTE — Telephone Encounter (Signed)
Pt return call back gave md response. Made appt to see md @ 1... 06/04/12@9 :30am/LMB

## 2012-06-04 NOTE — Telephone Encounter (Signed)
Tried calling no answer not able to leave vm due to vm not being set-up... 06/04/12@9 :03am/LMB

## 2012-06-04 NOTE — Assessment & Plan Note (Signed)
Refractory 1.5 ppd

## 2012-06-04 NOTE — Progress Notes (Signed)
Subjective:    Patient ID: Kimberly Adkins, female    DOB: Feb 25, 1956, 56 y.o.   MRN: [id number]  Cough This is a new problem. The current episode started in the past 7 days. The problem has been rapidly worsening. The problem occurs every few minutes. The cough is productive of brown sputum. Associated symptoms include rhinorrhea. Pertinent negatives include no chills, headaches or rash. Nothing aggravates the symptoms. Her past medical history is significant for bronchitis and COPD.   C/o R hand pain from cleaning F/u elev BP ; h/o high chol  BP Readings from Last 3 Encounters:  06/04/12 138/72  01/22/12 140/90  01/21/12 158/90   Wt Readings from Last 3 Encounters:  06/04/12 166 lb (75.297 kg)  01/22/12 159 lb (72.122 kg)  01/21/12 160 lb (72.576 kg)      Review of Systems  Constitutional: Negative for chills, activity change, appetite change, fatigue and unexpected weight change.  HENT: Positive for congestion and rhinorrhea. Negative for hearing loss, nosebleeds, mouth sores, neck pain and sinus pressure.   Eyes: Negative for visual disturbance.  Respiratory: Positive for cough. Negative for chest tightness.   Gastrointestinal: Negative for nausea, abdominal pain and anal bleeding.  Genitourinary: Negative for frequency, difficulty urinating and vaginal pain.  Musculoskeletal: Negative for back pain and gait problem.  Skin: Negative for pallor and rash.  Neurological: Negative for dizziness, tremors, weakness, numbness and headaches.  Psychiatric/Behavioral: Negative for confusion and disturbed wake/sleep cycle.       Objective:   Physical Exam  Constitutional: She appears well-developed. No distress.  HENT:  Head: Normocephalic.  Right Ear: External ear normal.  Left Ear: External ear normal.  Nose: Nose normal.  Mouth/Throat: Oropharynx is clear and moist.  Eyes: Conjunctivae are normal. Pupils are equal, round, and reactive to light. Right eye exhibits no  discharge. Left eye exhibits no discharge.  Neck: Normal range of motion. Neck supple. No JVD present. No tracheal deviation present. No thyromegaly present.  Cardiovascular: Normal rate, regular rhythm and normal heart sounds.   Pulmonary/Chest: No stridor. No respiratory distress. She has no wheezes.  Abdominal: Soft. Bowel sounds are normal. She exhibits no distension and no mass. There is no tenderness. There is no rebound and no guarding.  Musculoskeletal: She exhibits no edema and no tenderness.  Lymphadenopathy:    She has no cervical adenopathy.  Neurological: She displays normal reflexes. No cranial nerve deficit. She exhibits normal muscle tone. Coordination normal.  Skin: No rash noted. No erythema.  Psychiatric: She has a normal mood and affect. Her behavior is normal. Judgment and thought content normal.  R 1st MCP is tender  .       Assessment & Plan:

## 2012-06-04 NOTE — Assessment & Plan Note (Signed)
Better on Rx 

## 2012-06-04 NOTE — Assessment & Plan Note (Signed)
Better  

## 2012-06-04 NOTE — Assessment & Plan Note (Signed)
Advair bid 

## 2012-06-04 NOTE — Patient Instructions (Addendum)
To work on Monday

## 2012-06-10 ENCOUNTER — Telehealth: Payer: Self-pay | Admitting: Internal Medicine

## 2012-06-10 MED ORDER — PROMETHAZINE-CODEINE 6.25-10 MG/5ML PO SYRP: 5.0000 mL | ORAL_SOLUTION | ORAL | Status: AC | PRN

## 2012-06-10 NOTE — Telephone Encounter (Signed)
Prom/cod syr - pls call in Thx

## 2012-06-10 NOTE — Telephone Encounter (Signed)
Kimberly Adkins AT  [phone] Kimberly Adkins states she was seen in office on 06/04/12 and diagnosed with bronchitis.  Was ordered Amoxicillin. Has non productive cough.States she has gone back to work.( Per cough protocol has see provider within 24 hrs due to recurrent episode of coughing interfering with ability to carry out usual activities.) Very impatient with triage. Requesting a cough med be called in to" break up cough" to [facility] [phone]

## 2012-06-11 NOTE — Telephone Encounter (Signed)
Done. Jamal Maes to inform pt but no answer/vm.

## 2012-06-22 ENCOUNTER — Encounter: Payer: BC Managed Care – PPO | Admitting: Internal Medicine

## 2012-07-15 ENCOUNTER — Ambulatory Visit (INDEPENDENT_AMBULATORY_CARE_PROVIDER_SITE_OTHER): Payer: BC Managed Care – PPO | Admitting: Internal Medicine

## 2012-07-15 ENCOUNTER — Encounter: Payer: Self-pay | Admitting: Internal Medicine

## 2012-07-15 VITALS — BP 142/90 | HR 76 | Temp 97.8°F | Resp 16 | Ht 63.5 in | Wt 168.0 lb

## 2012-07-15 DIAGNOSIS — F4310 Post-traumatic stress disorder, unspecified: Secondary | ICD-10-CM

## 2012-07-15 DIAGNOSIS — R030 Elevated blood-pressure reading, without diagnosis of hypertension: Secondary | ICD-10-CM

## 2012-07-15 DIAGNOSIS — J449 Chronic obstructive pulmonary disease, unspecified: Secondary | ICD-10-CM

## 2012-07-15 DIAGNOSIS — E785 Hyperlipidemia, unspecified: Secondary | ICD-10-CM

## 2012-07-15 DIAGNOSIS — IMO0001 Reserved for inherently not codable concepts without codable children: Secondary | ICD-10-CM

## 2012-07-15 DIAGNOSIS — Z0000 Encounter for general adult medical examination without abnormal findings: Secondary | ICD-10-CM

## 2012-07-15 DIAGNOSIS — F4321 Adjustment disorder with depressed mood: Secondary | ICD-10-CM

## 2012-07-15 DIAGNOSIS — F429 Obsessive-compulsive disorder, unspecified: Secondary | ICD-10-CM

## 2012-07-15 DIAGNOSIS — I10 Essential (primary) hypertension: Secondary | ICD-10-CM

## 2012-07-15 MED ORDER — RANITIDINE HCL 150 MG PO TABS: 150.0000 mg | ORAL_TABLET | Freq: Two times a day (BID) | ORAL | Status: DC

## 2012-07-15 NOTE — Assessment & Plan Note (Signed)
She needs to stop smoking

## 2012-07-15 NOTE — Assessment & Plan Note (Signed)
We discussed age appropriate health related issues, including available/recomended screening tests and vaccinations. We discussed a need for adhering to healthy diet and exercise. Labs/EKG were reviewed/ordered. All questions were answered.   

## 2012-07-15 NOTE — Assessment & Plan Note (Signed)
Continue with current prescription therapy as reflected on the Med list.  

## 2012-07-15 NOTE — Assessment & Plan Note (Signed)
Re-start Rx 

## 2012-07-15 NOTE — Assessment & Plan Note (Signed)
Re-start rx 

## 2012-07-15 NOTE — Progress Notes (Signed)
Subjective:    Patient ID: Kimberly Adkins, female    DOB: 1956/10/09, 56 y.o.   MRN: [id number]  HPI  The patient is here for a wellness exam. The patient has been doing well overall without major physical or psychological issues going on lately, except for one 5 min long dizzy spell the other day.  The patient needs to address  chronic hypertension that has been well controlled with medicine  C/o R hand pain from cleaning - not better  F/u elev BP ; h/o high chol  BP Readings from Last 3 Encounters:  07/15/12 142/90  06/04/12 138/72  01/22/12 140/90   Wt Readings from Last 3 Encounters:  07/15/12 168 lb (76.204 kg)  06/04/12 166 lb (75.297 kg)  01/22/12 159 lb (72.122 kg)      Review of Systems  Constitutional: Negative for activity change, appetite change, fatigue and unexpected weight change.  HENT: Positive for congestion. Negative for hearing loss, nosebleeds, mouth sores, neck pain and sinus pressure.   Eyes: Negative for visual disturbance.  Respiratory: Negative for chest tightness.   Gastrointestinal: Negative for nausea, abdominal pain and anal bleeding.  Genitourinary: Negative for frequency, difficulty urinating and vaginal pain.  Musculoskeletal: Negative for back pain and gait problem.  Skin: Negative for pallor.  Neurological: Negative for dizziness, tremors, weakness and numbness.  Psychiatric/Behavioral: Negative for confusion and disturbed wake/sleep cycle.       Objective:   Physical Exam  Constitutional: She appears well-developed. No distress.  HENT:  Head: Normocephalic.  Right Ear: External ear normal.  Left Ear: External ear normal.  Nose: Nose normal.  Mouth/Throat: Oropharynx is clear and moist.  Eyes: Conjunctivae normal are normal. Pupils are equal, round, and reactive to light. Right eye exhibits no discharge. Left eye exhibits no discharge.  Neck: Normal range of motion. Neck supple. No JVD present. No tracheal deviation present. No  thyromegaly present.  Cardiovascular: Normal rate, regular rhythm and normal heart sounds.   Pulmonary/Chest: No stridor. No respiratory distress. She has no wheezes.  Abdominal: Soft. Bowel sounds are normal. She exhibits no distension and no mass. There is no tenderness. There is no rebound and no guarding.  Musculoskeletal: She exhibits no edema and no tenderness.  Lymphadenopathy:    She has no cervical adenopathy.  Neurological: She displays normal reflexes. No cranial nerve deficit. She exhibits normal muscle tone. Coordination normal.  Skin: No rash noted. No erythema.  Psychiatric: She has a normal mood and affect. Her behavior is normal. Judgment and thought content normal.  R 1st MCP is tender AKs, SKs - chest, extremities  . Lab Results  Component Value Date   WBC 9.9 01/22/2012   HGB 12.9 01/22/2012   HCT 37.9 01/22/2012   PLT 298.0 01/22/2012   GLUCOSE 90 01/22/2012   CHOL 286* 01/22/2012   TRIG 227.0* 01/22/2012   HDL 52.10 01/22/2012   LDLDIRECT 209.1 01/22/2012   ALT 16 01/22/2012   AST 19 01/22/2012   NA 138 01/22/2012   K 4.5 01/22/2012   CL 101 01/22/2012   CREATININE 0.8 01/22/2012   BUN 10 01/22/2012   CO2 27 01/22/2012   TSH 1.69 01/22/2012   INR 1.0 01/22/2012         Assessment & Plan:

## 2012-07-15 NOTE — Assessment & Plan Note (Signed)
  On diet  

## 2012-07-15 NOTE — Assessment & Plan Note (Signed)
Remote -- PTSD - her husband shot himself in 2010 Discussed

## 2012-10-21 ENCOUNTER — Ambulatory Visit: Payer: BC Managed Care – PPO | Admitting: Internal Medicine

## 2012-11-23 ENCOUNTER — Ambulatory Visit: Payer: BC Managed Care – PPO | Admitting: Internal Medicine

## 2012-12-23 ENCOUNTER — Other Ambulatory Visit: Payer: Self-pay | Admitting: *Deleted

## 2012-12-23 MED ORDER — HYDROCHLOROTHIAZIDE 12.5 MG PO CAPS: 12.5000 mg | ORAL_CAPSULE | Freq: Every day | ORAL | Status: DC

## 2013-01-18 ENCOUNTER — Ambulatory Visit: Payer: BC Managed Care – PPO | Admitting: Internal Medicine

## 2013-01-18 DIAGNOSIS — Z0289 Encounter for other administrative examinations: Secondary | ICD-10-CM

## 2013-02-16 ENCOUNTER — Ambulatory Visit (INDEPENDENT_AMBULATORY_CARE_PROVIDER_SITE_OTHER): Payer: BC Managed Care – PPO | Admitting: Internal Medicine

## 2013-02-16 ENCOUNTER — Encounter: Payer: Self-pay | Admitting: Internal Medicine

## 2013-02-16 VITALS — BP 144/80 | HR 73 | Temp 97.4°F | Wt 161.8 lb

## 2013-02-16 DIAGNOSIS — E785 Hyperlipidemia, unspecified: Secondary | ICD-10-CM

## 2013-02-16 DIAGNOSIS — N959 Unspecified menopausal and perimenopausal disorder: Secondary | ICD-10-CM

## 2013-02-16 DIAGNOSIS — F4321 Adjustment disorder with depressed mood: Secondary | ICD-10-CM

## 2013-02-16 DIAGNOSIS — L570 Actinic keratosis: Secondary | ICD-10-CM

## 2013-02-16 DIAGNOSIS — IMO0001 Reserved for inherently not codable concepts without codable children: Secondary | ICD-10-CM

## 2013-02-16 DIAGNOSIS — K219 Gastro-esophageal reflux disease without esophagitis: Secondary | ICD-10-CM

## 2013-02-16 DIAGNOSIS — J449 Chronic obstructive pulmonary disease, unspecified: Secondary | ICD-10-CM

## 2013-02-16 DIAGNOSIS — F4310 Post-traumatic stress disorder, unspecified: Secondary | ICD-10-CM

## 2013-02-16 DIAGNOSIS — F329 Major depressive disorder, single episode, unspecified: Secondary | ICD-10-CM

## 2013-02-16 DIAGNOSIS — F411 Generalized anxiety disorder: Secondary | ICD-10-CM

## 2013-02-16 DIAGNOSIS — Z1211 Encounter for screening for malignant neoplasm of colon: Secondary | ICD-10-CM

## 2013-02-16 DIAGNOSIS — R030 Elevated blood-pressure reading, without diagnosis of hypertension: Secondary | ICD-10-CM

## 2013-02-16 DIAGNOSIS — F17200 Nicotine dependence, unspecified, uncomplicated: Secondary | ICD-10-CM

## 2013-02-16 MED ORDER — RANITIDINE HCL 150 MG PO TABS: 150.0000 mg | ORAL_TABLET | Freq: Two times a day (BID) | ORAL | Status: DC

## 2013-02-16 MED ORDER — HYDROCHLOROTHIAZIDE 12.5 MG PO CAPS: 12.5000 mg | ORAL_CAPSULE | Freq: Every day | ORAL | Status: DC

## 2013-02-16 MED ORDER — OMEPRAZOLE 20 MG PO CPDR: 20.0000 mg | DELAYED_RELEASE_CAPSULE | Freq: Every day | ORAL | Status: DC

## 2013-02-16 NOTE — Assessment & Plan Note (Signed)
Continue with current prescription therapy as reflected on the Med list.  

## 2013-02-16 NOTE — Assessment & Plan Note (Signed)
Chronic sx's PTSD/OCD

## 2013-02-16 NOTE — Progress Notes (Signed)
   Subjective:    HPI  The patient has been doing well overall without major physical or psychological issues going on lately  The patient needs to address  chronic hypertension that has been well controlled with medicine  She had a drug test at work recently [employer] OA.   F/u elev BP ; h/o high chol  BP Readings from Last 3 Encounters:  02/16/13 144/80  07/15/12 142/90  06/04/12 138/72   Wt Readings from Last 3 Encounters:  02/16/13 161 lb 12.8 oz (73.392 kg)  07/15/12 168 lb (76.204 kg)  06/04/12 166 lb (75.297 kg)      Review of Systems  Constitutional: Negative for activity change, appetite change, fatigue and unexpected weight change.  HENT: Positive for congestion. Negative for hearing loss, nosebleeds, mouth sores, neck pain and sinus pressure.   Eyes: Negative for visual disturbance.  Respiratory: Negative for chest tightness.   Gastrointestinal: Negative for nausea, abdominal pain and anal bleeding.  Genitourinary: Negative for frequency, difficulty urinating and vaginal pain.  Musculoskeletal: Negative for back pain and gait problem.  Skin: Negative for pallor.  Neurological: Negative for dizziness, tremors, weakness and numbness.  Psychiatric/Behavioral: Negative for confusion and sleep disturbance.       Objective:   Physical Exam  Constitutional: She appears well-developed. No distress.  HENT:  Head: Normocephalic.  Right Ear: External ear normal.  Left Ear: External ear normal.  Nose: Nose normal.  Mouth/Throat: Oropharynx is clear and moist.  Eyes: Conjunctivae are normal. Pupils are equal, round, and reactive to light. Right eye exhibits no discharge. Left eye exhibits no discharge.  Neck: Normal range of motion. Neck supple. No JVD present. No tracheal deviation present. No thyromegaly present.  Cardiovascular: Normal rate, regular rhythm and normal heart sounds.   Pulmonary/Chest: No stridor. No respiratory distress. She has no wheezes.  Abdominal:  Soft. Bowel sounds are normal. She exhibits no distension and no mass. There is no tenderness. There is no rebound and no guarding.  Musculoskeletal: She exhibits no edema and no tenderness.  Lymphadenopathy:    She has no cervical adenopathy.  Neurological: She displays normal reflexes. No cranial nerve deficit. She exhibits normal muscle tone. Coordination normal.  Skin: No rash noted. No erythema.  Psychiatric: She has a normal mood and affect. Her behavior is normal. Judgment and thought content normal.   AKs, SKs - chest, extremities  . Lab Results  Component Value Date   WBC 9.9 01/22/2012   HGB 12.9 01/22/2012   HCT 37.9 01/22/2012   PLT 298.0 01/22/2012   GLUCOSE 90 01/22/2012   CHOL 286* 01/22/2012   TRIG 227.0* 01/22/2012   HDL 52.10 01/22/2012   LDLDIRECT 209.1 01/22/2012   ALT 16 01/22/2012   AST 19 01/22/2012   NA 138 01/22/2012   K 4.5 01/22/2012   CL 101 01/22/2012   CREATININE 0.8 01/22/2012   BUN 10 01/22/2012   CO2 27 01/22/2012   TSH 1.69 01/22/2012   INR 1.0 01/22/2012         Assessment & Plan:

## 2013-02-16 NOTE — Assessment & Plan Note (Signed)
Stop smoking

## 2013-02-16 NOTE — Assessment & Plan Note (Signed)
Repeat labs Rx discussed

## 2013-02-16 NOTE — Assessment & Plan Note (Signed)
PTD/OCD Dr Marshall Cork in [facility] Continue with current prescription therapy as reflected on the Med list.

## 2013-02-23 ENCOUNTER — Encounter: Payer: Self-pay | Admitting: Internal Medicine

## 2013-03-09 ENCOUNTER — Ambulatory Visit (INDEPENDENT_AMBULATORY_CARE_PROVIDER_SITE_OTHER)
Admission: RE | Admit: 2013-03-09 | Discharge: 2013-03-09 | Disposition: A | Discharge status: Home / Self Care | Payer: BC Managed Care – PPO | Source: Ambulatory Visit | Attending: Internal Medicine | Admitting: Internal Medicine

## 2013-03-09 DIAGNOSIS — N959 Unspecified menopausal and perimenopausal disorder: Secondary | ICD-10-CM

## 2013-03-31 ENCOUNTER — Other Ambulatory Visit: Payer: Self-pay | Admitting: Endocrinology

## 2013-03-31 ENCOUNTER — Other Ambulatory Visit: Payer: Self-pay | Admitting: *Deleted

## 2013-03-31 MED ORDER — VALACYCLOVIR HCL 500 MG PO TABS: 500.0000 mg | ORAL_TABLET | Freq: Two times a day (BID) | ORAL | Status: DC

## 2013-04-01 ENCOUNTER — Telehealth: Payer: Self-pay

## 2013-04-01 NOTE — Telephone Encounter (Signed)
Pt informed of results.

## 2013-04-01 NOTE — Telephone Encounter (Signed)
Returning call about bone density results

## 2013-04-08 ENCOUNTER — Other Ambulatory Visit (INDEPENDENT_AMBULATORY_CARE_PROVIDER_SITE_OTHER): Payer: BC Managed Care – PPO

## 2013-04-08 DIAGNOSIS — F329 Major depressive disorder, single episode, unspecified: Secondary | ICD-10-CM

## 2013-04-08 DIAGNOSIS — J449 Chronic obstructive pulmonary disease, unspecified: Secondary | ICD-10-CM

## 2013-04-08 DIAGNOSIS — F4321 Adjustment disorder with depressed mood: Secondary | ICD-10-CM

## 2013-04-08 DIAGNOSIS — R030 Elevated blood-pressure reading, without diagnosis of hypertension: Secondary | ICD-10-CM

## 2013-04-08 DIAGNOSIS — K219 Gastro-esophageal reflux disease without esophagitis: Secondary | ICD-10-CM

## 2013-04-08 DIAGNOSIS — L570 Actinic keratosis: Secondary | ICD-10-CM

## 2013-04-08 DIAGNOSIS — F4310 Post-traumatic stress disorder, unspecified: Secondary | ICD-10-CM

## 2013-04-08 DIAGNOSIS — E785 Hyperlipidemia, unspecified: Secondary | ICD-10-CM

## 2013-04-08 DIAGNOSIS — F411 Generalized anxiety disorder: Secondary | ICD-10-CM

## 2013-04-08 DIAGNOSIS — IMO0001 Reserved for inherently not codable concepts without codable children: Secondary | ICD-10-CM

## 2013-04-08 DIAGNOSIS — F17200 Nicotine dependence, unspecified, uncomplicated: Secondary | ICD-10-CM

## 2013-04-08 LAB — BASIC METABOLIC PANEL
BUN: 11 mg/dL (ref 6–23)
Calcium: 9.3 mg/dL (ref 8.4–10.5)
Creatinine, Ser: 0.7 mg/dL (ref 0.4–1.2)
GFR: 86.55 mL/min (ref >60.00)
Glucose, Bld: 101 mg/dL — ABNORMAL HIGH (ref 70–99)

## 2013-04-08 LAB — HEPATIC FUNCTION PANEL
ALT: 13 U/L (ref 0–35)
AST: 15 U/L (ref 0–37)
Bilirubin, Direct: 0 mg/dL (ref 0.0–0.3)
Total Bilirubin: 0.2 mg/dL — ABNORMAL LOW (ref 0.3–1.2)
Total Protein: 7.3 g/dL (ref 6.0–8.3)

## 2013-04-08 LAB — TSH: TSH: 1.17 u[IU]/mL (ref 0.35–5.50)

## 2013-04-08 LAB — LIPID PANEL
HDL: 38.9 mg/dL — ABNORMAL LOW (ref >39.00)
Triglycerides: 196 mg/dL — ABNORMAL HIGH (ref 0.0–149.0)

## 2013-04-13 ENCOUNTER — Encounter: Payer: Self-pay | Admitting: Gastroenterology

## 2013-04-13 ENCOUNTER — Ambulatory Visit (AMBULATORY_SURGERY_CENTER): Payer: BC Managed Care – PPO | Admitting: *Deleted

## 2013-04-13 VITALS — Ht 63.5 in | Wt 164.9 lb

## 2013-04-13 DIAGNOSIS — Z1211 Encounter for screening for malignant neoplasm of colon: Secondary | ICD-10-CM

## 2013-04-13 MED ORDER — MOVIPREP 100 G PO SOLR: ORAL | Status: DC

## 2013-04-27 ENCOUNTER — Encounter: Payer: BC Managed Care – PPO | Admitting: Gastroenterology

## 2013-04-27 ENCOUNTER — Encounter: Payer: BC Managed Care – PPO | Admitting: Internal Medicine

## 2013-06-10 ENCOUNTER — Telehealth: Payer: Self-pay | Admitting: *Deleted

## 2013-06-10 NOTE — Telephone Encounter (Signed)
Pt called requesting lab results from June, 2014.  Please advise

## 2013-06-10 NOTE — Telephone Encounter (Signed)
Labs were ok except for elev cholesterol. Keep rov Thx

## 2013-06-10 NOTE — Telephone Encounter (Signed)
Spoke with pt advised of MDs result note. 

## 2013-07-25 ENCOUNTER — Emergency Department (HOSPITAL_COMMUNITY): Payer: BC Managed Care – PPO

## 2013-07-25 ENCOUNTER — Emergency Department (HOSPITAL_COMMUNITY)
Admission: EM | Admit: 2013-07-25 | Discharge: 2013-07-25 | Disposition: A | Discharge status: Home / Self Care | Payer: BC Managed Care – PPO | Attending: Emergency Medicine | Admitting: Emergency Medicine

## 2013-07-25 ENCOUNTER — Encounter (HOSPITAL_COMMUNITY): Payer: Self-pay | Admitting: Emergency Medicine

## 2013-07-25 DIAGNOSIS — Z79899 Other long term (current) drug therapy: Secondary | ICD-10-CM | POA: Insufficient documentation

## 2013-07-25 DIAGNOSIS — W230XXA Caught, crushed, jammed, or pinched between moving objects, initial encounter: Secondary | ICD-10-CM | POA: Insufficient documentation

## 2013-07-25 DIAGNOSIS — F3289 Other specified depressive episodes: Secondary | ICD-10-CM | POA: Insufficient documentation

## 2013-07-25 DIAGNOSIS — Z8639 Personal history of other endocrine, nutritional and metabolic disease: Secondary | ICD-10-CM | POA: Insufficient documentation

## 2013-07-25 DIAGNOSIS — F17200 Nicotine dependence, unspecified, uncomplicated: Secondary | ICD-10-CM | POA: Insufficient documentation

## 2013-07-25 DIAGNOSIS — S6710XA Crushing injury of unspecified finger(s), initial encounter: Secondary | ICD-10-CM | POA: Insufficient documentation

## 2013-07-25 DIAGNOSIS — F329 Major depressive disorder, single episode, unspecified: Secondary | ICD-10-CM | POA: Insufficient documentation

## 2013-07-25 DIAGNOSIS — K219 Gastro-esophageal reflux disease without esophagitis: Secondary | ICD-10-CM | POA: Insufficient documentation

## 2013-07-25 DIAGNOSIS — Z7982 Long term (current) use of aspirin: Secondary | ICD-10-CM | POA: Insufficient documentation

## 2013-07-25 DIAGNOSIS — Z862 Personal history of diseases of the blood and blood-forming organs and certain disorders involving the immune mechanism: Secondary | ICD-10-CM | POA: Insufficient documentation

## 2013-07-25 DIAGNOSIS — Y939 Activity, unspecified: Secondary | ICD-10-CM | POA: Insufficient documentation

## 2013-07-25 DIAGNOSIS — Y929 Unspecified place or not applicable: Secondary | ICD-10-CM | POA: Insufficient documentation

## 2013-07-25 DIAGNOSIS — F411 Generalized anxiety disorder: Secondary | ICD-10-CM | POA: Insufficient documentation

## 2013-07-25 DIAGNOSIS — I10 Essential (primary) hypertension: Secondary | ICD-10-CM | POA: Insufficient documentation

## 2013-07-25 DIAGNOSIS — M1990 Unspecified osteoarthritis, unspecified site: Secondary | ICD-10-CM | POA: Insufficient documentation

## 2013-07-25 MED ORDER — TRAMADOL HCL 50 MG PO TABS: 50.0000 mg | ORAL_TABLET | Freq: Four times a day (QID) | ORAL | Status: DC | PRN

## 2013-07-25 MED ORDER — CEPHALEXIN 500 MG PO CAPS: 500.0000 mg | ORAL_CAPSULE | Freq: Four times a day (QID) | ORAL | Status: DC

## 2013-07-25 NOTE — ED Notes (Signed)
Pt states she received Tetanus at [facility] on [address] in [place].  Contacted the [facility] and she did have a TDAP on 04/23/2011.  Updated immunizations.

## 2013-07-25 NOTE — ED Provider Notes (Signed)
Medical screening examination/treatment/procedure(s) were performed by non-physician practitioner and as supervising physician I was immediately available for consultation/collaboration.  Theola Cuellar T Taven Strite, MD 07/25/13 1546 

## 2013-07-25 NOTE — ED Provider Notes (Signed)
CSN: [id number]     Arrival date & time 07/25/13  1103 History   First MD Initiated Contact with Patient 07/25/13 1127     Chief Complaint  Patient presents with  . Finger Injury    left pinky   (Consider location/radiation/quality/duration/timing/severity/associated sxs/prior Treatment) HPI  Pt here to be evaluated for her left pinky finger tip injury. Yesterday she got it crushed between and a building. She says that it hurts significantly but she does not think its broken. She had a hard time getting the bleeding to stop but it is no longer bleeding today. She works as a Engineer, water and says that even with a glove it will be difficult to keep it clean and dry, especially to keep the chemicals off of it. She denies numbness, tingling, drainage or increased pain to the area since yesterday.   Past Medical History  Diagnosis Date  . Anxiety   . Depression   . GERD (gastroesophageal reflux disease)   . Hyperlipidemia   . Osteoarthritis   . PTSD (post-traumatic stress disorder) 2010  . Hypertension    Past Surgical History  Procedure Laterality Date  . Anterior and posterior repair  2007  . Tubal ligation  1994  . Breast biopsy  1997    right; benign   Family History  Problem Relation Age of Onset  . Cancer Mother     Breast cancer  . Breast cancer Mother     Age 14  . Coronary artery disease Father   . Hypertension Father   . Breast cancer Paternal Grandmother     Age 71  . Colon polyps Paternal Grandmother 75  . Breast cancer Paternal Aunt     Age 23's  . Cancer Maternal Grandmother     colon  . Colon polyps Maternal Grandfather 60   History  Substance Use Topics  . Smoking status: Current Every Day Smoker -- 1.50 packs/day for 22 years    Types: Cigarettes  . Smokeless tobacco: Never Used  . Alcohol Use: Yes     Comment: very rare   OB History   Grav Para Term Preterm Abortions TAB SAB Ect Mult Living   2 2 2       2      Review of Systems The patient denies  anorexia, fever, weight loss,, vision loss, decreased hearing, hoarseness, chest pain, syncope, dyspnea on exertion, peripheral edema, balance deficits, hemoptysis, abdominal pain, melena, hematochezia, severe indigestion/heartburn, hematuria, incontinence, genital sores, muscle weakness, suspicious skin lesions, transient blindness, difficulty walking, depression, unusual weight change, abnormal bleeding, enlarged lymph nodes, angioedema, and breast masses.   Allergies  Review of patient's allergies indicates no known allergies.  Home Medications   Current Outpatient Rx  Name  Route  Sig  Dispense  Refill  . ALPRAZolam (XANAX) 1 MG tablet   Oral   Take 1 mg by mouth 3 (three) times daily as needed.           Marland Kitchen aspirin 81 MG tablet   Oral   Take 81 mg by mouth daily.           . Aspirin-Acetaminophen-Caffeine (GOODYS EXTRA STRENGTH) 520-260-32.5 MG PACK   Oral   Take 1 packet by mouth daily as needed (pain).         . Cholecalciferol (VITAMIN D3) 1000 UNITS CAPS   Oral   Take by mouth daily.           Marland Kitchen FLUoxetine (PROZAC) 20 MG capsule  Oral   Take 60 mg by mouth daily.          . Fluticasone-Salmeterol (ADVAIR DISKUS) 100-50 MCG/DOSE AEPB   Inhalation   Inhale 1 puff into the lungs 2 (two) times daily.   1 each   3   . hydrochlorothiazide (MICROZIDE) 12.5 MG capsule   Oral   Take 1 capsule (12.5 mg total) by mouth daily.   30 capsule   11   . omeprazole (PRILOSEC) 20 MG capsule   Oral   Take 1 capsule (20 mg total) by mouth daily.   30 capsule   11   . ranitidine (ZANTAC) 150 MG tablet   Oral   Take 1 tablet (150 mg total) by mouth 2 (two) times daily.   60 tablet   11   . valACYclovir (VALTREX) 500 MG tablet   Oral   Take 1 tablet (500 mg total) by mouth 2 (two) times daily.   30 tablet   0   . cephALEXin (KEFLEX) 500 MG capsule   Oral   Take 1 capsule (500 mg total) by mouth 4 (four) times daily.   40 capsule   0   . traMADol (ULTRAM) 50  MG tablet   Oral   Take 1 tablet (50 mg total) by mouth every 6 (six) hours as needed for pain.   15 tablet   0    BP 140/83  Pulse 83  Temp(Src) 98.2 F (36.8 C) (Oral)  Resp 16  SpO2 96% Physical Exam  Nursing note and vitals reviewed. Constitutional: She appears well-developed and well-nourished. No distress.  HENT:  Head: Normocephalic and atraumatic.  Eyes: Pupils are equal, round, and reactive to light.  Neck: Normal range of motion. Neck supple.  Cardiovascular: Normal rate and regular rhythm.   Pulmonary/Chest: Effort normal.  Abdominal: Soft.  Musculoskeletal:       Left hand: She exhibits tenderness, bony tenderness, laceration and swelling. She exhibits normal range of motion, normal two-point discrimination, normal capillary refill and no deformity. Normal sensation noted.       Hands: Neurological: She is alert.  Skin: Skin is warm and dry.    ED Course  Procedures (including critical care time) Labs Review Labs Reviewed - No data to display Imaging Review Dg Finger Little Left  07/25/2013   CLINICAL DATA:  Crush injury to left 5th finger tip.  EXAM: LEFT LITTLE FINGER 2+V  COMPARISON:  None.  FINDINGS: There is finger tip soft tissue swelling. There is no fracture. The joints are normally aligned.  IMPRESSION: No fracture or dislocation.   Electronically Signed   By: Amie Portland M.D.   On: 07/25/2013 12:00    EKG Interpretation   None       MDM   1. Crush injury to finger, initial encounter    No fracture. Wound no longer bleeding Pt UTD on tetanus (2013) Wound is clean and dry  Static splint placed, pain medication, Keflex abx Rx, Work note  57 y.o.Hanadi L Derringer's evaluation in the Emergency Department is complete. It has been determined that no acute conditions requiring further emergency intervention are present at this time. The patient/guardian have been advised of the diagnosis and plan. We have discussed signs and symptoms that warrant  return to the ED, such as changes or worsening in symptoms.  Vital signs are stable at discharge. Filed Vitals:   07/25/13 1118  BP: 140/83  Pulse: 83  Temp: 98.2 F (36.8 C)  Resp: 16  Patient/guardian has voiced understanding and agreed to follow-up with the PCP or specialist.   Dorthula Matas, PA-C 07/25/13 1230

## 2013-07-25 NOTE — ED Notes (Signed)
Pt states she cut her left pinky finger on a metal strap attached to a tow truck.  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  Pt has avulsion laceration to left anterior pinky finger with bruising.

## 2014-08-08 ENCOUNTER — Encounter: Payer: Self-pay | Admitting: Family

## 2014-08-08 ENCOUNTER — Ambulatory Visit (INDEPENDENT_AMBULATORY_CARE_PROVIDER_SITE_OTHER): Payer: BC Managed Care – PPO | Admitting: Family

## 2014-08-08 VITALS — BP 140/70 | HR 76 | Temp 97.6°F | Resp 18 | Wt 160.1 lb

## 2014-08-08 DIAGNOSIS — J309 Allergic rhinitis, unspecified: Secondary | ICD-10-CM

## 2014-08-08 DIAGNOSIS — F411 Generalized anxiety disorder: Secondary | ICD-10-CM

## 2014-08-08 NOTE — Progress Notes (Signed)
Subjective:    Patient ID: Kimberly Adkins, female    DOB: [birth year], 58 y.o.   MRN: [id number]  Chief Complaint  Patient presents with  . Headache    have been having headaches x1 week as well as shaking, says it could be bc she does not like her job    HPI:  Kimberly Adkins is a 58 y.o. female who presents today for headache.   1) Anxiety/Depression/PTSD - Husband was in another room and committed suicide in front of her. Since she has had a mixture of anxiety, depression and PTSD which has altered her ability to function normally. 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 Reports financial difficulties and is currently living with her daughter and grandchild. States she would quit her job, but they need the money to support them and without it they would not make it. States she has had to start over since this incident. Currently being managed on Xanax and prozac prescribed by Dr. Marshall Cork. He is also encouraging additional therapy to help with her current situation. Currently working night shift. Not feeling any ideations of suicide or homicide.   2) Allergic Rhinitis - has noticed increased stuffiness with a sore throat. This has been going on for about 3-4 days. Denies fevers, bodyaches, sinus pressure or headache or cough. Has taken allegra on occasion which has helped. Concerned because she does not want to get sick or get anyone else around her sick.  No Known Allergies  Current Outpatient Prescriptions on File Prior to Visit  Medication Sig Dispense Refill  . ALPRAZolam (XANAX) 1 MG tablet Take 1 mg by mouth 3 (three) times daily as needed.        Marland Kitchen aspirin 81 MG tablet Take 81 mg by mouth daily.        . Aspirin-Acetaminophen-Caffeine (GOODYS EXTRA STRENGTH) 520-260-32.5 MG PACK Take 1 packet by mouth daily as needed (pain).      .  Cholecalciferol (VITAMIN D3) 1000 UNITS CAPS Take by mouth daily.        Marland Kitchen FLUoxetine (PROZAC) 20 MG capsule Take 60 mg by mouth daily.       . Fluticasone-Salmeterol (ADVAIR DISKUS) 100-50 MCG/DOSE AEPB Inhale 1 puff into the lungs 2 (two) times daily.  1 each  3  . omeprazole (PRILOSEC) 20 MG capsule Take 1 capsule (20 mg total) by mouth daily.  30 capsule  11  . ranitidine (ZANTAC) 150 MG tablet Take 1 tablet (150 mg total) by mouth 2 (two) times daily.  60 tablet  11  . valACYclovir (VALTREX) 500 MG tablet Take 1 tablet (500 mg total) by mouth 2 (two) times daily.  30 tablet  0   No current facility-administered medications on file prior to visit.     Review of Systems    See HPI Objective:    BP 140/70  Pulse 76  Temp(Src) 97.6 F (36.4 C) (Oral)  Resp 18  Wt 160 lb 1.9 oz (72.63 kg)  SpO2 98% Nursing note and vital signs reviewed.  Physical Exam  Constitutional: She is oriented to person, place, and time. She appears well-developed and well-nourished. No distress.  Appears anxious and fidgety.   HENT:  Right Ear: Hearing, tympanic membrane, external ear and ear canal normal.  Left Ear: Hearing, tympanic membrane, external ear and ear canal normal.  Nose: Mucosal edema present. Right sinus exhibits no maxillary sinus tenderness and no frontal sinus tenderness. Left sinus exhibits no maxillary sinus tenderness and no frontal sinus tenderness.  Mouth/Throat: Uvula is midline, oropharynx is clear and moist and mucous membranes are normal.  Neck: Neck supple.  Cardiovascular: Normal rate, regular rhythm and normal heart sounds.   Pulmonary/Chest: Effort normal and breath sounds normal.  Lymphadenopathy:    She has no cervical adenopathy.  Neurological: She is alert and oriented to person, place, and time.  Skin: Skin is warm and dry.  Psychiatric: She has a normal mood and affect. Her behavior is normal. Judgment and thought content normal.       Assessment & Plan:

## 2014-08-08 NOTE — Progress Notes (Signed)
Pre visit review using our clinic review tool, if applicable. No additional management support is needed unless otherwise documented below in the visit note. 

## 2014-08-08 NOTE — Assessment & Plan Note (Signed)
Symptoms consistent with anxiety most likely related to the grief, PTSD and life stressors. Discussed potentials of job transition with her and she was open to the ideas. Also emphasized importance of counseling recommended by Dr. Marshall CorkPhan. Continue current Xanax and Prozac with no changes. Instructed to seek emergency care if she develops thoughts of suicide.

## 2014-08-08 NOTE — Assessment & Plan Note (Signed)
Symptoms consistent with allergic rhinitis. Continue current OTC therapy with Allegra as needed. Continue current Advair as prescribed. Follow up if symptoms worsen or fail to improve.

## 2014-08-08 NOTE — Patient Instructions (Signed)
Thank you for choosing [facility].  Summary/Instructions:   Please continue to talk with Dr. Marshall CorkPhan regarding your situation. I am not going to make any medication changes at this time, just the recommendation to see Dr. Marshall CorkPhan.  You have great potential to be successful. Please let us know if we can help in any way.   Please follow up if necessary  If you experience thoughts of harming yourself or others please go directly to the Emergency Room.

## 2014-08-09 ENCOUNTER — Telehealth: Payer: Self-pay | Admitting: Internal Medicine

## 2014-08-09 NOTE — Telephone Encounter (Signed)
emmi emailed °

## 2014-08-15 ENCOUNTER — Encounter: Payer: Self-pay | Admitting: Family

## 2014-11-11 ENCOUNTER — Telehealth: Payer: Self-pay | Admitting: *Deleted

## 2014-11-11 NOTE — Telephone Encounter (Signed)
LVM to see if pt already had Flu shot done or need an appointment to get one. 

## 2014-12-30 ENCOUNTER — Telehealth: Payer: Self-pay

## 2014-12-30 NOTE — Telephone Encounter (Signed)
No way to leave message about flu vaccine 

## 2015-05-24 ENCOUNTER — Telehealth: Payer: Self-pay | Admitting: Geriatric Medicine

## 2015-05-24 NOTE — Telephone Encounter (Signed)
Left message asking patient to call us back to let us know if she has had a mammogram recently.  

## 2016-01-20 ENCOUNTER — Encounter (HOSPITAL_COMMUNITY): Payer: Self-pay | Admitting: Emergency Medicine

## 2016-01-20 ENCOUNTER — Emergency Department (HOSPITAL_COMMUNITY)
Admission: EM | Admit: 2016-01-20 | Discharge: 2016-01-20 | Disposition: A | Discharge status: Home / Self Care | Payer: Self-pay | Attending: Emergency Medicine | Admitting: Emergency Medicine

## 2016-01-20 DIAGNOSIS — I10 Essential (primary) hypertension: Secondary | ICD-10-CM | POA: Insufficient documentation

## 2016-01-20 DIAGNOSIS — F419 Anxiety disorder, unspecified: Secondary | ICD-10-CM | POA: Insufficient documentation

## 2016-01-20 DIAGNOSIS — Z8639 Personal history of other endocrine, nutritional and metabolic disease: Secondary | ICD-10-CM | POA: Insufficient documentation

## 2016-01-20 DIAGNOSIS — K219 Gastro-esophageal reflux disease without esophagitis: Secondary | ICD-10-CM | POA: Insufficient documentation

## 2016-01-20 DIAGNOSIS — M1990 Unspecified osteoarthritis, unspecified site: Secondary | ICD-10-CM | POA: Insufficient documentation

## 2016-01-20 DIAGNOSIS — Z7951 Long term (current) use of inhaled steroids: Secondary | ICD-10-CM | POA: Insufficient documentation

## 2016-01-20 DIAGNOSIS — K0381 Cracked tooth: Secondary | ICD-10-CM | POA: Insufficient documentation

## 2016-01-20 DIAGNOSIS — Z7982 Long term (current) use of aspirin: Secondary | ICD-10-CM | POA: Insufficient documentation

## 2016-01-20 DIAGNOSIS — F329 Major depressive disorder, single episode, unspecified: Secondary | ICD-10-CM | POA: Insufficient documentation

## 2016-01-20 DIAGNOSIS — Z79899 Other long term (current) drug therapy: Secondary | ICD-10-CM | POA: Insufficient documentation

## 2016-01-20 DIAGNOSIS — F17210 Nicotine dependence, cigarettes, uncomplicated: Secondary | ICD-10-CM | POA: Insufficient documentation

## 2016-01-20 DIAGNOSIS — K047 Periapical abscess without sinus: Secondary | ICD-10-CM | POA: Insufficient documentation

## 2016-01-20 MED ORDER — HYDROCODONE-ACETAMINOPHEN 5-325 MG PO TABS: 2.0000 | ORAL_TABLET | ORAL | Status: DC | PRN

## 2016-01-20 MED ORDER — AMOXICILLIN 500 MG PO CAPS: 500.0000 mg | ORAL_CAPSULE | Freq: Three times a day (TID) | ORAL | Status: DC

## 2016-01-20 NOTE — ED Notes (Signed)
Pt states she is here for left lower dental pain. Pt states she has taken 4 goody powders today, and 2 norco tablets. Pt states she currently does not have a dentist

## 2016-01-20 NOTE — Discharge Instructions (Signed)
Dental Abscess °A dental abscess is a collection of pus in or around a tooth. °CAUSES °This condition is caused by a bacterial infection around the root of the tooth that involves the inner part of the tooth (pulp). It may result from: °· Severe tooth decay. °· Trauma to the tooth that allows bacteria to enter into the pulp, such as a broken or chipped tooth. °· Severe gum disease around a tooth. °SYMPTOMS °Symptoms of this condition include: °· Severe pain in and around the infected tooth. °· Swelling and redness around the infected tooth, in the mouth, or in the face. °· Tenderness. °· Pus drainage. °· Bad breath. °· Bitter taste in the mouth. °· Difficulty swallowing. °· Difficulty opening the mouth. °· Nausea. °· Vomiting. °· Chills. °· Swollen neck glands. °· Fever. °DIAGNOSIS °This condition is diagnosed with examination of the infected tooth. During the exam, your dentist may tap on the infected tooth. Your dentist will also ask about your medical and dental history and may order X-rays. °TREATMENT °This condition is treated by eliminating the infection. This may be done with: °· Antibiotic medicine. °· A root canal. This may be performed to save the tooth. °· Pulling (extracting) the tooth. This may also involve draining the abscess. This is done if the tooth cannot be saved. °HOME CARE INSTRUCTIONS °· Take medicines only as directed by your dentist. °· If you were prescribed antibiotic medicine, finish all of it even if you start to feel better. °· Rinse your mouth (gargle) often with salt water to relieve pain or swelling. °· Do not drive or operate heavy machinery while taking pain medicine. °· Do not apply heat to the outside of your mouth. °· Keep all follow-up visits as directed by your dentist. This is important. °SEEK MEDICAL CARE IF: °· Your pain is worse and is not helped by medicine. °SEEK IMMEDIATE MEDICAL CARE IF: °· You have a fever or chills. °· Your symptoms suddenly get worse. °· You have a  very bad headache. °· You have problems breathing or swallowing. °· You have trouble opening your mouth. °· You have swelling in your neck or around your eye. °  °This information is not intended to replace advice given to you by your health care provider. Make sure you discuss any questions you have with your health care provider. °  °Document Released: 09/30/2005 Document Revised: 02/14/2015 Document Reviewed: 09/27/2014 °Elsevier Interactive Patient Education ©2016 Elsevier Inc. ° °Hypertension °Hypertension, commonly called high blood pressure, is when the force of blood pumping through your arteries is too strong. Your arteries are the blood vessels that carry blood from your heart throughout your body. A blood pressure reading consists of a higher number over a lower number, such as 110/72. The higher number (systolic) is the pressure inside your arteries when your heart pumps. The lower number (diastolic) is the pressure inside your arteries when your heart relaxes. Ideally you want your blood pressure below 120/80. °Hypertension forces your heart to work harder to pump blood. Your arteries may become narrow or stiff. Having untreated or uncontrolled hypertension can cause heart attack, stroke, kidney disease, and other problems. °RISK FACTORS °Some risk factors for high blood pressure are controllable. Others are not.  °Risk factors you cannot control include:  °· Race. You may be at higher risk if you are African American. °· Age. Risk increases with age. °· Gender. Men are at higher risk than women before age 45 years. After age 65, women are at   higher risk than men. °Risk factors you can control include: °· Not getting enough exercise or physical activity. °· Being overweight. °· Getting too much fat, sugar, calories, or salt in your diet. °· Drinking too much alcohol. °SIGNS AND SYMPTOMS °Hypertension does not usually cause signs or symptoms. Extremely high blood pressure (hypertensive crisis) may cause  headache, anxiety, shortness of breath, and nosebleed. °DIAGNOSIS °To check if you have hypertension, your health care provider will measure your blood pressure while you are seated, with your arm held at the level of your heart. It should be measured at least twice using the same arm. Certain conditions can cause a difference in blood pressure between your right and left arms. A blood pressure reading that is higher than normal on one occasion does not mean that you need treatment. If it is not clear whether you have high blood pressure, you may be asked to return on a different day to have your blood pressure checked again. Or, you may be asked to monitor your blood pressure at home for 1 or more weeks. °TREATMENT °Treating high blood pressure includes making lifestyle changes and possibly taking medicine. Living a healthy lifestyle can help lower high blood pressure. You may need to change some of your habits. °Lifestyle changes may include: °· Following the DASH diet. This diet is high in fruits, vegetables, and whole grains. It is low in salt, red meat, and added sugars. °· Keep your sodium intake below 2,300 mg per day. °· Getting at least 30-45 minutes of aerobic exercise at least 4 times per week. °· Losing weight if necessary. °· Not smoking. °· Limiting alcoholic beverages. °· Learning ways to reduce stress. °Your health care provider may prescribe medicine if lifestyle changes are not enough to get your blood pressure under control, and if one of the following is true: °· You are 18-59 years of age and your systolic blood pressure is above 140. °· You are 60 years of age or older, and your systolic blood pressure is above 150. °· Your diastolic blood pressure is above 90. °· You have diabetes, and your systolic blood pressure is over 140 or your diastolic blood pressure is over 90. °· You have kidney disease and your blood pressure is above 140/90. °· You have heart disease and your blood pressure is  above 140/90. °Your personal target blood pressure may vary depending on your medical conditions, your age, and other factors. °HOME CARE INSTRUCTIONS °· Have your blood pressure rechecked as directed by your health care provider.   °· Take medicines only as directed by your health care provider. Follow the directions carefully. Blood pressure medicines must be taken as prescribed. The medicine does not work as well when you skip doses. Skipping doses also puts you at risk for problems. °· Do not smoke.   °· Monitor your blood pressure at home as directed by your health care provider.  °SEEK MEDICAL CARE IF:  °· You think you are having a reaction to medicines taken. °· You have recurrent headaches or feel dizzy. °· You have swelling in your ankles. °· You have trouble with your vision. °SEEK IMMEDIATE MEDICAL CARE IF: °· You develop a severe headache or confusion. °· You have unusual weakness, numbness, or feel faint. °· You have severe chest or abdominal pain. °· You vomit repeatedly. °· You have trouble breathing. °MAKE SURE YOU:  °· Understand these instructions. °· Will watch your condition. °· Will get help right away if you are not doing well   get worse.   This information is not intended to replace advice given to you by your health care provider. Make sure you discuss any questions you have with your health care provider.   Document Released: 09/30/2005 Document Revised: 02/14/2015 Document Reviewed: 07/23/2013 Elsevier Interactive Patient Education [employer]2016 Elsevier Inc.

## 2016-01-20 NOTE — ED Provider Notes (Signed)
CSN: [id number]     Arrival date & time 01/20/16  1700 History   By signing my name below, I, Lorenza Chick, attest that this documentation has been prepared under the direction and in the presence of Langston Masker PA-C Electronically Signed: Lorenza Chick, ED Scribe. 01/20/2016. 6:39 PM.   Chief Complaint  Patient presents with  . Dental Pain   The history is provided by the patient. No language interpreter was used.    HPI Comments: Kimberly Adkins is a 60 y.o. female who presents to the Emergency Department complaining of constant, gradually worsening lower front and left dental pain x 3 days. Pt has hx of dental abscesses and 22 crowns. Pt does not currently have a dentist. She has associated gum and facial swelling near the affected area. Pt denies fever and chills.    Past Medical History  Diagnosis Date  . Anxiety   . Depression   . GERD (gastroesophageal reflux disease)   . Hyperlipidemia   . Osteoarthritis   . PTSD (post-traumatic stress disorder) 2010  . Hypertension    Past Surgical History  Procedure Laterality Date  . Anterior and posterior repair  2007  . Tubal ligation  1994  . Breast biopsy  1997    right; benign   Family History  Problem Relation Age of Onset  . Cancer Mother     Breast cancer  . Breast cancer Mother     Age 79  . Coronary artery disease Father   . Hypertension Father   . Breast cancer Paternal Grandmother     Age 52  . Colon polyps Paternal Grandmother 53  . Breast cancer Paternal Aunt     Age 60's  . Cancer Maternal Grandmother     colon  . Colon polyps Maternal Grandfather 93   Social History  Substance Use Topics  . Smoking status: Current Every Day Smoker -- 1.50 packs/day for 22 years    Types: Cigarettes  . Smokeless tobacco: Never Used  . Alcohol Use: Yes     Comment: very rare   OB History    Gravida Para Term Preterm AB TAB SAB Ectopic Multiple Living   Review of Systems  Constitutional: Negative for  fever and chills.  HENT: Positive for dental problem and facial swelling.   All other systems reviewed and are negative.     Allergies  Review of patient's allergies indicates no known allergies.  Home Medications   Prior to Admission medications   Medication Sig Start Date End Date Taking? Authorizing Provider  ALPRAZolam Prudy Feeler) 1 MG tablet Take 1 mg by mouth 3 (three) times daily as needed.      Historical Provider, MD  aspirin 81 MG tablet Take 81 mg by mouth daily.      Historical Provider, MD  Aspirin-Acetaminophen-Caffeine (GOODYS EXTRA STRENGTH) 520-260-32.5 MG PACK Take 1 packet by mouth daily as needed (pain).    Historical Provider, MD  Cholecalciferol (VITAMIN D3) 1000 UNITS CAPS Take by mouth daily.      Historical Provider, MD  FLUoxetine (PROZAC) 20 MG capsule Take 60 mg by mouth daily.     Historical Provider, MD  Fluticasone-Salmeterol (ADVAIR DISKUS) 100-50 MCG/DOSE AEPB Inhale 1 puff into the lungs 2 (two) times daily. 12/09/11   Aleksei Plotnikov V, MD  omeprazole (PRILOSEC) 20 MG capsule Take 1 capsule (20 mg total) by mouth daily. 02/16/13   Aleksei Plotnikov  V, MD  ranitidine (ZANTAC) 150 MG tablet Take 1 tablet (150 mg total) by mouth 2 (two) times daily. 02/16/13   Aleksei Plotnikov V, MD  valACYclovir (VALTREX) 500 MG tablet Take 1 tablet (500 mg total) by mouth 2 (two) times daily. 03/31/13   Romero BellingSean Ellison, MD   BP 171/98 mmHg  Pulse 72  Temp(Src) 97.8 F (36.6 C) (Oral)  Resp 16  SpO2 99% Physical Exam  Constitutional: She is oriented to person, place, and time. She appears well-developed and well-nourished. No distress.  HENT:  Head: Normocephalic and atraumatic.  Swelling to left lower gum line.  Tooth broken into gingiva.  Swelling to left face.     Eyes: Conjunctivae and EOM are normal.  Neck: Neck supple.  Cardiovascular: Normal rate, regular rhythm and normal heart sounds.   Pulmonary/Chest: Effort normal and breath sounds normal.  Abdominal: Soft.   Musculoskeletal: Normal range of motion.  Lymphadenopathy:    She has no cervical adenopathy.  Neurological: She is alert and oriented to person, place, and time.  Skin: Skin is warm and dry.  Psychiatric: She has a normal mood and affect. Her behavior is normal.  Nursing note and vitals reviewed.   ED Course  Procedures (including critical care time)  DIAGNOSTIC STUDIES: Oxygen Saturation is 99% on RA, noraml by my interpretation.    COORDINATION OF CARE: 6:26 PM-Discussed treatment plan with pt at bedside and pt agreed to plan.   Labs Review Labs Reviewed - No data to display  Imaging Review No results found.   EKG Interpretation None      MDM  Patient with dentalgia.  No abscess requiring immediate incision and drainage.  Exam not concerning for Ludwig's angina or pharyngeal abscess.  Will treat with amoxicillin and hydrocodone-acetominopehn. Pt referred to Dr. Lucky CowboyKnox. Pt counseled on HTN and need for follow up with primary care. Discussed return precautions. Pt safe for discharge.    Final diagnoses:  Dental abscess  Essential hypertension    Meds ordered this encounter  Medications  . amoxicillin (AMOXIL) 500 MG capsule    Sig: Take 1 capsule (500 mg total) by mouth 3 (three) times daily.    Dispense:  21 capsule    Refill:  0    Order Specific Question:  Supervising Provider    Answer:  MILLER, BRIAN [[id number]]  . HYDROcodone-acetaminophen (NORCO/VICODIN) 5-325 MG tablet    Sig: Take 2 tablets by mouth every 4 (four) hours as needed.    Dispense:  16 tablet    Refill:  0    Order Specific Question:  Supervising Provider    Answer:  Eber HongMILLER, BRIAN [[id number]]   An After Visit Summary was printed and given to the patient.  Lonia SkinnerLeslie K AtlantaSofia, PA-C 01/20/16 1905  Lorre NickAnthony Allen, MD 01/24/16 [phone]

## 2016-01-20 NOTE — ED Notes (Signed)
Pt reports L lower dental pain. Thinks she has an abscess.

## 2017-04-28 ENCOUNTER — Encounter (HOSPITAL_COMMUNITY): Payer: Self-pay

## 2017-04-28 ENCOUNTER — Emergency Department (HOSPITAL_COMMUNITY)
Admission: EM | Admit: 2017-04-28 | Discharge: 2017-04-28 | Disposition: A | Discharge status: Home / Self Care | Payer: Self-pay | Attending: Emergency Medicine | Admitting: Emergency Medicine

## 2017-04-28 DIAGNOSIS — F4321 Adjustment disorder with depressed mood: Secondary | ICD-10-CM

## 2017-04-28 DIAGNOSIS — J449 Chronic obstructive pulmonary disease, unspecified: Secondary | ICD-10-CM | POA: Insufficient documentation

## 2017-04-28 DIAGNOSIS — I10 Essential (primary) hypertension: Secondary | ICD-10-CM | POA: Insufficient documentation

## 2017-04-28 DIAGNOSIS — R51 Headache: Secondary | ICD-10-CM | POA: Insufficient documentation

## 2017-04-28 DIAGNOSIS — R1111 Vomiting without nausea: Secondary | ICD-10-CM

## 2017-04-28 DIAGNOSIS — Z7982 Long term (current) use of aspirin: Secondary | ICD-10-CM | POA: Insufficient documentation

## 2017-04-28 DIAGNOSIS — F17210 Nicotine dependence, cigarettes, uncomplicated: Secondary | ICD-10-CM | POA: Insufficient documentation

## 2017-04-28 DIAGNOSIS — F419 Anxiety disorder, unspecified: Secondary | ICD-10-CM | POA: Insufficient documentation

## 2017-04-28 DIAGNOSIS — F329 Major depressive disorder, single episode, unspecified: Secondary | ICD-10-CM | POA: Insufficient documentation

## 2017-04-28 DIAGNOSIS — R519 Headache, unspecified: Secondary | ICD-10-CM

## 2017-04-28 DIAGNOSIS — F4320 Adjustment disorder, unspecified: Secondary | ICD-10-CM

## 2017-04-28 DIAGNOSIS — Z79899 Other long term (current) drug therapy: Secondary | ICD-10-CM | POA: Insufficient documentation

## 2017-04-28 MED ORDER — PROCHLORPERAZINE MALEATE 10 MG PO TABS: 10.0000 mg | ORAL_TABLET | Freq: Two times a day (BID) | ORAL | 0 refills | Status: DC | PRN

## 2017-04-28 MED ORDER — ONDANSETRON 4 MG PO TBDP
ORAL_TABLET | ORAL | Status: AC
  Filled 2017-04-28: qty 1

## 2017-04-28 MED ORDER — PROCHLORPERAZINE MALEATE 5 MG PO TABS
10.0000 mg | ORAL_TABLET | Freq: Once | ORAL | Status: AC
  Administered 2017-04-28: 10 mg via ORAL
  Filled 2017-04-28: qty 2

## 2017-04-28 MED ORDER — DIPHENHYDRAMINE HCL 25 MG PO CAPS
25.0000 mg | ORAL_CAPSULE | Freq: Once | ORAL | Status: AC
  Administered 2017-04-28: 25 mg via ORAL
  Filled 2017-04-28: qty 1

## 2017-04-28 MED ORDER — ONDANSETRON 4 MG PO TBDP
4.0000 mg | ORAL_TABLET | Freq: Once | ORAL | Status: AC
  Administered 2017-04-28: 4 mg via ORAL

## 2017-04-28 NOTE — ED Provider Notes (Signed)
[facility]-EMERGENCY DEPT Provider Note   CSN: [id number] Arrival date & time: 04/28/17  0028     History   Chief Complaint Chief Complaint  Patient presents with  . Emesis  . Headache    HPI  Blood pressure (!) 163/74, pulse 62, temperature (!) 97.4 F (36.3 C), temperature source Oral, resp. rate 14, height 5\' 4"  (1.626 m), weight 65.3 kg (144 lb), SpO2 97 %.  Kimberly Adkins is a 61 y.o. female complaining of headache with multiple episodes of emesis today. Unfortunately, this patient's daughter overdosed on heroin, she has no brain activity and she will be taken off life support shortly. She states that she drove up from [place] in a car with no air conditioning that exacerbated her headache. Pt denies fever, rash, confusion, cervicalgia, LOC/syncope, change in vision, N/V, numbness, weakness, dysarthria, ataxia, thunderclap onset, exacerbation with exertion or valsalva, exacerbation in morning, CP, SOB, abdominal pain.   Past Medical History:  Diagnosis Date  . Anxiety   . Depression   . GERD (gastroesophageal reflux disease)   . Hyperlipidemia   . Hypertension   . Osteoarthritis   . PTSD (post-traumatic stress disorder) 2010    Patient Active Problem List   Diagnosis Date Noted  . Allergic rhinitis 08/08/2014  . Actinic keratoses 02/16/2013  . Dyslipidemia 02/16/2013  . Well adult exam 07/15/2012  . Hand pain, right 06/04/2012  . Hypertension   . Hematochezia 01/21/2012  . Elevated blood pressure 01/21/2012  . Bronchitis 12/09/2011  . COPD (chronic obstructive pulmonary disease) (HCC) 12/09/2011  . Herpes labialis 11/03/2011  . FURUNCLE 11/07/2009  . PTSD 09/21/2009  . HOARSENESS 09/21/2009  . HYPERLIPIDEMIA 07/14/2009  . Anxiety state 07/14/2009  . Obsessive-compulsive disorders 07/14/2009  . TOBACCO USE DISORDER/SMOKER-SMOKING CESSATION DISCUSSED 07/14/2009  . GRIEF REACTION 07/14/2009  . DEPRESSION 07/14/2009  . GERD 07/14/2009  . OSTEOARTHRITIS  07/14/2009    Past Surgical History:  Procedure Laterality Date  . ANTERIOR AND POSTERIOR REPAIR  2007  . BREAST BIOPSY  1997   right; benign  . TUBAL LIGATION  1994    OB History    Gravida Para Term Preterm AB Living   2 2 2     2    SAB TAB Ectopic Multiple Live Births                   Home Medications    Prior to Admission medications   Medication Sig Start Date End Date Taking? Authorizing Provider  ALPRAZolam Prudy Feeler(XANAX) 1 MG tablet Take 1 mg by mouth 3 (three) times daily as needed.      [provider]  amoxicillin (AMOXIL) 500 MG capsule Take 1 capsule (500 mg total) by mouth 3 (three) times daily. 01/20/16   Elson AreasSofia, Leslie K, PA-C  aspirin 81 MG tablet Take 81 mg by mouth daily.      [provider]  Aspirin-Acetaminophen-Caffeine (GOODYS EXTRA STRENGTH) 520-260-32.5 MG PACK Take 1 packet by mouth daily as needed (pain).    [provider]  Cholecalciferol (VITAMIN D3) 1000 UNITS CAPS Take by mouth daily.      [provider]  FLUoxetine (PROZAC) 20 MG capsule Take 60 mg by mouth daily.     [provider]  Fluticasone-Salmeterol (ADVAIR DISKUS) 100-50 MCG/DOSE AEPB Inhale 1 puff into the lungs 2 (two) times daily. 12/09/11   Plotnikov, Georgina QuintAleksei V, MD  HYDROcodone-acetaminophen (NORCO/VICODIN) 5-325 MG tablet Take 2 tablets by mouth every 4 (four) hours as needed. 01/20/16  Cheron Schaumann K, PA-C  omeprazole (PRILOSEC) 20 MG capsule Take 1 capsule (20 mg total) by mouth daily. 02/16/13   Plotnikov, Georgina Quint, MD  prochlorperazine (COMPAZINE) 10 MG tablet Take 1 tablet (10 mg total) by mouth 2 (two) times daily as needed for nausea or vomiting (Nausea ). 04/28/17   Kourtnee Lahey, Joni Reining, PA-C  ranitidine (ZANTAC) 150 MG tablet Take 1 tablet (150 mg total) by mouth 2 (two) times daily. 02/16/13   Plotnikov, Georgina Quint, MD  valACYclovir (VALTREX) 500 MG tablet Take 1 tablet (500 mg total) by mouth 2 (two) times daily. 03/31/13   Romero Belling, MD     Family History Family History  Problem Relation Age of Onset  . Breast cancer Paternal Grandmother        Age 32  . Colon polyps Paternal Grandmother 72  . Colon polyps Maternal Grandfather 60  . Cancer Mother        Breast cancer  . Breast cancer Mother        Age 67  . Coronary artery disease Father   . Hypertension Father   . Breast cancer Paternal Aunt        Age 67's  . Cancer Maternal Grandmother        colon    Social History Social History  Substance Use Topics  . Smoking status: Current Every Day Smoker    Packs/day: 1.50    Years: 22.00    Types: Cigarettes  . Smokeless tobacco: Never Used  . Alcohol use Yes     Comment: very rare     Allergies   Patient has no known allergies.   Review of Systems Review of Systems  A complete review of systems was obtained and all systems are negative except as noted in the HPI and PMH.    Physical Exam Updated Vital Signs BP (!) 163/74   Pulse 62   Temp (!) 97.4 F (36.3 C) (Oral)   Resp 14   Ht 5\' 4"  (1.626 m)   Wt 65.3 kg (144 lb)   SpO2 97%   BMI 24.72 kg/m   Physical Exam  Constitutional: She is oriented to person, place, and time. She appears well-developed and well-nourished.  HENT:  Head: Normocephalic and atraumatic.  Mouth/Throat: Oropharynx is clear and moist.  Eyes: Pupils are equal, round, and reactive to light. Conjunctivae and EOM are normal.  No TTP of maxillary or frontal sinuses  No TTP or induration of temporal arteries bilaterally  Neck: Normal range of motion. Neck supple.  FROM to C-spine. Pt can touch chin to chest without discomfort. No TTP of midline cervical spine.   Cardiovascular: Normal rate, regular rhythm and intact distal pulses.   Pulmonary/Chest: Effort normal and breath sounds normal. No respiratory distress. She has no wheezes. She has no rales. She exhibits no tenderness.  Abdominal: Soft. Bowel sounds are normal. There is no tenderness.  Musculoskeletal:  Normal range of motion. She exhibits no edema or tenderness.  Neurological: She is alert and oriented to person, place, and time. No cranial nerve deficit.  II-Visual fields grossly intact. III/IV/VI-Extraocular movements intact.  Pupils reactive bilaterally. V/VII-Smile symmetric, equal eyebrow raise,  facial sensation intact VIII- Hearing grossly intact IX/X-Normal gag XI-bilateral shoulder shrug XII-midline tongue extension Motor: 5/5 bilaterally with normal tone and bulk Cerebellar: Normal finger-to-nose  and normal heel-to-shin test.   Romberg negative Ambulates with a coordinated gait   Psychiatric:  Tearful, depressed  Nursing note and vitals reviewed.  ED Treatments / Results  Labs (all labs ordered are listed, but only abnormal results are displayed) Labs Reviewed - No data to display  EKG  EKG Interpretation None       Radiology No results found.  Procedures Procedures (including critical care time)  Medications Ordered in ED Medications  ondansetron (ZOFRAN-ODT) disintegrating tablet 4 mg (4 mg Oral Given 04/28/17 0044)  prochlorperazine (COMPAZINE) tablet 10 mg (10 mg Oral Given 04/28/17 0227)  diphenhydrAMINE (BENADRYL) capsule 25 mg (25 mg Oral Given 04/28/17 0227)     Initial Impression / Assessment and Plan / ED Course  I have reviewed the triage vital signs and the nursing notes.  Pertinent labs & imaging results that were available during my care of the patient were reviewed by me and considered in my medical decision making (see chart for details).    Vitals:   04/28/17 0035 04/28/17 0130 04/28/17 0200  BP: (!) 179/78 (!) 179/88 (!) 163/74  Pulse: 76 70 62  Resp: 14    Temp: (!) 97.4 F (36.3 C)    TempSrc: Oral    SpO2: 98% 99% 97%  Weight: 65.3 kg (144 lb)    Height: 5\' 4"  (1.626 m)      Medications  ondansetron (ZOFRAN-ODT) disintegrating tablet 4 mg (4 mg Oral Given 04/28/17 0044)  prochlorperazine (COMPAZINE) tablet 10 mg (10 mg  Oral Given 04/28/17 0227)  diphenhydrAMINE (BENADRYL) capsule 25 mg (25 mg Oral Given 04/28/17 0227)    YAILEN ZEMAITIS is 61 y.o. 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 Her neurologic exam is nonfocal, abdominal exam is benign. No red flags and patient declines IV, blood work, headache cocktail she wants to return to her daughter's bedside. I divided her to return to the ED at anytime if her symptoms worsen.  Evaluation does not show pathology that would require ongoing emergent intervention or inpatient treatment. Pt is hemodynamically stable and mentating appropriately. Discussed findings and plan with patient/guardian, who agrees with care plan. All questions answered. Return precautions discussed and outpatient follow up given.      Final Clinical Impressions(s) / ED Diagnoses   Final diagnoses:  Nonintractable headache, unspecified chronicity pattern, unspecified headache type  Vomiting without nausea, intractability of vomiting not specified, unspecified vomiting type  Grief reaction    New Prescriptions Discharge Medication List as of 04/28/2017  2:16 AM    START taking these medications   Details  prochlorperazine (COMPAZINE) 10 MG tablet Take 1 tablet (10 mg total) by mouth 2 (two) times daily as needed for nausea or vomiting (Nausea )., Starting Mon 04/28/2017, Print         Tamber Burtch, [place], PA-C 04/28/17 0235    Dione Booze, MD 04/28/17 [phone]

## 2017-04-28 NOTE — ED Notes (Signed)
Pt understood dc material. NAD noted. Script given at dc  

## 2017-04-28 NOTE — Discharge Instructions (Signed)
Please follow with your primary care doctor in the next 2 days for a check-up. They must obtain records for further management.  ° °Do not hesitate to return to the Emergency Department for any new, worsening or concerning symptoms.  ° °

## 2017-04-28 NOTE — ED Triage Notes (Signed)
Pt states she is here d/t emesis and headache. She states she is under a lot of stress d/t daughter being taken off life support tomorrow. She also states she took Norco at [employer]1200 tonight and xanax at [employer]10pm tonight. Pt drowsy in triage

## 2018-09-29 ENCOUNTER — Other Ambulatory Visit: Payer: Self-pay

## 2018-09-29 ENCOUNTER — Encounter (HOSPITAL_COMMUNITY): Payer: Self-pay | Admitting: Emergency Medicine

## 2018-09-29 ENCOUNTER — Emergency Department (HOSPITAL_COMMUNITY)
Admission: EM | Admit: 2018-09-29 | Discharge: 2018-09-29 | Disposition: A | Discharge status: Home / Self Care | Payer: Self-pay | Attending: Emergency Medicine | Admitting: Emergency Medicine

## 2018-09-29 DIAGNOSIS — F17210 Nicotine dependence, cigarettes, uncomplicated: Secondary | ICD-10-CM | POA: Insufficient documentation

## 2018-09-29 DIAGNOSIS — J449 Chronic obstructive pulmonary disease, unspecified: Secondary | ICD-10-CM | POA: Insufficient documentation

## 2018-09-29 DIAGNOSIS — I10 Essential (primary) hypertension: Secondary | ICD-10-CM | POA: Insufficient documentation

## 2018-09-29 DIAGNOSIS — Z79899 Other long term (current) drug therapy: Secondary | ICD-10-CM | POA: Insufficient documentation

## 2018-09-29 MED ORDER — AMLODIPINE BESYLATE 5 MG PO TABS: 5.0000 mg | ORAL_TABLET | Freq: Every day | ORAL | 1 refills | Status: DC

## 2018-09-29 NOTE — Discharge Instructions (Signed)
Follow up with a primary care provider for further management of elevated blood pressure. Return to the ED with any emergent concerns.

## 2018-09-29 NOTE — ED Triage Notes (Signed)
Pt reports that she goes to [facility] for all her [facility] issues and every time she goes there and her BP is high but cant give her any medications. Pt doesn't have PCP to follow up with. Pt reports that she sometimes will have headaches but denies any today.

## 2018-09-29 NOTE — ED Provider Notes (Signed)
[facility]-EMERGENCY DEPT Provider Note   CSN: [id number] Arrival date & time: 09/29/18  1727     History   Chief Complaint Chief Complaint  Patient presents with  . Hypertension    HPI [facility] is a 62 y.o. female.  Patient to ED with concern for elevated blood pressure. She was on medication about 10 years ago but was lost to follow up and did not continue. She denies chest pain, SOB, LE edema. She has headaches occasionally but has not been concerned about them. She states there is a strong family history of hypertension and heart attacks. She is getting re-established in the area after moving back from [place] and has no primary care provider.   No language interpreter was used.  Hypertension     Past Medical History:  Diagnosis Date  . Anxiety   . Depression   . GERD (gastroesophageal reflux disease)   . Hyperlipidemia   . Hypertension   . Osteoarthritis   . PTSD (post-traumatic stress disorder) 2010    Patient Active Problem List   Diagnosis Date Noted  . Allergic rhinitis 08/08/2014  . Actinic keratoses 02/16/2013  . Dyslipidemia 02/16/2013  . Well adult exam 07/15/2012  . Hand pain, right 06/04/2012  . Hypertension   . Hematochezia 01/21/2012  . Elevated blood pressure 01/21/2012  . Bronchitis 12/09/2011  . COPD (chronic obstructive pulmonary disease) (HCC) 12/09/2011  . Herpes labialis 11/03/2011  . FURUNCLE 11/07/2009  . PTSD 09/21/2009  . HOARSENESS 09/21/2009  . HYPERLIPIDEMIA 07/14/2009  . Anxiety state 07/14/2009  . Obsessive-compulsive disorders 07/14/2009  . TOBACCO USE DISORDER/SMOKER-SMOKING CESSATION DISCUSSED 07/14/2009  . GRIEF REACTION 07/14/2009  . DEPRESSION 07/14/2009  . GERD 07/14/2009  . OSTEOARTHRITIS 07/14/2009    Past Surgical History:  Procedure Laterality Date  . ANTERIOR AND POSTERIOR REPAIR  2007  . BREAST BIOPSY  1997   right; benign  . TUBAL LIGATION  1994     OB History    Gravida  2   Para  2   Term  2   Preterm      AB      Living  2     SAB      TAB      Ectopic      Multiple      Live Births               Home Medications    Prior to Admission medications   Medication Sig Start Date End Date Taking? Authorizing Provider  acetaminophen (TYLENOL) 500 MG tablet Take 1,000 mg by mouth every 4 (four) hours as needed for mild pain.   Yes [provider]  ALPRAZolam Prudy Feeler) 1 MG tablet Take 1 mg by mouth 3 (three) times daily.    Yes [provider]  diphenhydrAMINE (BENADRYL) 25 mg capsule Take 25 mg by mouth at bedtime.   Yes [provider]  FLUoxetine (PROZAC) 20 MG capsule Take 60 mg by mouth daily.    Yes [provider]  ibuprofen (ADVIL,MOTRIN) 200 MG tablet Take 400 mg by mouth every 4 (four) hours as needed for headache or mild pain.   Yes [provider]  omeprazole (PRILOSEC OTC) 20 MG tablet Take 20 mg by mouth at bedtime.   Yes [provider]  amoxicillin (AMOXIL) 500 MG capsule Take 1 capsule (500 mg total) by mouth 3 (three) times daily. Patient not taking: Reported on 09/29/2018 01/20/16   Cheron Schaumann  K, PA-C  Fluticasone-Salmeterol (ADVAIR DISKUS) 100-50 MCG/DOSE AEPB Inhale 1 puff into the lungs 2 (two) times daily. Patient not taking: Reported on 09/29/2018 12/09/11   Plotnikov, Georgina Quint, MD  HYDROcodone-acetaminophen (NORCO/VICODIN) 5-325 MG tablet Take 2 tablets by mouth every 4 (four) hours as needed. Patient not taking: Reported on 09/29/2018 01/20/16   Elson Areas, PA-C  omeprazole (PRILOSEC) 20 MG capsule Take 1 capsule (20 mg total) by mouth daily. Patient not taking: Reported on 09/29/2018 02/16/13   Plotnikov, Georgina Quint, MD  prochlorperazine (COMPAZINE) 10 MG tablet Take 1 tablet (10 mg total) by mouth 2 (two) times daily as needed for nausea or vomiting (Nausea ). Patient not taking: Reported on 09/29/2018 04/28/17   Pisciotta, Joni Reining, PA-C  ranitidine  (ZANTAC) 150 MG tablet Take 1 tablet (150 mg total) by mouth 2 (two) times daily. Patient not taking: Reported on 09/29/2018 02/16/13   Plotnikov, Georgina Quint, MD  valACYclovir (VALTREX) 500 MG tablet Take 1 tablet (500 mg total) by mouth 2 (two) times daily. Patient not taking: Reported on 09/29/2018 03/31/13   Romero Belling, MD    Family History Family History  Problem Relation Age of Onset  . Breast cancer Paternal Grandmother        Age 58  . Colon polyps Paternal Grandmother 35  . Colon polyps Maternal Grandfather 60  . Cancer Mother        Breast cancer  . Breast cancer Mother        Age 44  . Coronary artery disease Father   . Hypertension Father   . Breast cancer Paternal Aunt        Age 27's  . Cancer Maternal Grandmother        colon    Social History Social History   Tobacco Use  . Smoking status: Current Every Day Smoker    Packs/day: 1.50    Years: 22.00    Pack years: 33.00    Types: Cigarettes  . Smokeless tobacco: Never Used  Substance Use Topics  . Alcohol use: Yes  . Drug use: No     Allergies   Hydromorphone   Review of Systems Review of Systems  Constitutional: Negative for diaphoresis.  HENT: Negative.   Respiratory: Negative.   Cardiovascular: Negative.   Gastrointestinal: Negative.   Musculoskeletal: Negative.   Skin: Negative.   Neurological: Negative.      Physical Exam Updated Vital Signs BP (!) 184/95 (BP Location: Left Arm)   Pulse 79   Temp 97.9 F (36.6 C) (Oral)   Resp (!) 22   SpO2 98%   Physical Exam Vitals signs and nursing note reviewed.  Constitutional:      Appearance: She is well-developed.  HENT:     Head: Normocephalic.  Neck:     Musculoskeletal: Normal range of motion and neck supple.  Cardiovascular:     Rate and Rhythm: Normal rate and regular rhythm.  Pulmonary:     Effort: Pulmonary effort is normal.     Breath sounds: Normal breath sounds.  Abdominal:     General: Bowel sounds are normal.      Palpations: Abdomen is soft.     Tenderness: There is no abdominal tenderness. There is no guarding or rebound.  Musculoskeletal: Normal range of motion.     Right lower leg: No edema.     Left lower leg: No edema.  Skin:    General: Skin is warm and dry.     Findings: No rash.  Neurological:     General: No focal deficit present.     Mental Status: She is alert and oriented to person, place, and time.      ED Treatments / Results  Labs (all labs ordered are listed, but only abnormal results are displayed) Labs Reviewed - No data to display  EKG None  Radiology No results found.  Procedures Procedures (including critical care time)  Medications Ordered in ED Medications - No data to display   Initial Impression / Assessment and Plan / ED Course  I have reviewed the triage vital signs and the nursing notes.  Pertinent labs & imaging results that were available during my care of the patient were reviewed by me and considered in my medical decision making (see chart for details).     Patient to ED with concern for blood pressure being high. Previously medicated but not in years. No symptoms or complaints.   Blood pressure elevated today and on multiple previous charts through ED. Will start on Norvasc 5 mg. Will provide primary care resources.   Final Clinical Impressions(s) / ED Diagnoses   Final diagnoses:  None   1. Hypertension   ED Discharge Orders    None       Elpidio AnisUpstill, Sadaf Przybysz, Cordelia Poche-C 09/29/18 2252    Arby BarrettePfeiffer, Marcy, MD 09/30/18 [phone]

## 2020-01-24 ENCOUNTER — Ambulatory Visit: Payer: Self-pay | Attending: Nurse Practitioner | Admitting: Nurse Practitioner

## 2020-01-24 ENCOUNTER — Other Ambulatory Visit: Payer: Self-pay

## 2020-01-24 ENCOUNTER — Encounter: Payer: Self-pay | Admitting: Nurse Practitioner

## 2020-01-24 VITALS — Ht 63.0 in

## 2020-01-24 DIAGNOSIS — E782 Mixed hyperlipidemia: Secondary | ICD-10-CM

## 2020-01-24 DIAGNOSIS — Z1211 Encounter for screening for malignant neoplasm of colon: Secondary | ICD-10-CM

## 2020-01-24 DIAGNOSIS — J42 Unspecified chronic bronchitis: Secondary | ICD-10-CM

## 2020-01-24 DIAGNOSIS — I10 Essential (primary) hypertension: Secondary | ICD-10-CM

## 2020-01-24 DIAGNOSIS — Z1159 Encounter for screening for other viral diseases: Secondary | ICD-10-CM

## 2020-01-24 DIAGNOSIS — Z114 Encounter for screening for human immunodeficiency virus [HIV]: Secondary | ICD-10-CM

## 2020-01-24 DIAGNOSIS — Z7689 Persons encountering health services in other specified circumstances: Secondary | ICD-10-CM

## 2020-01-24 DIAGNOSIS — Z130 Encounter for screening for diseases of the blood and blood-forming organs and certain disorders involving the immune mechanism: Secondary | ICD-10-CM

## 2020-01-24 DIAGNOSIS — Z131 Encounter for screening for diabetes mellitus: Secondary | ICD-10-CM

## 2020-01-24 MED ORDER — ALBUTEROL SULFATE HFA 108 (90 BASE) MCG/ACT IN AERS: 2.0000 | INHALATION_SPRAY | Freq: Four times a day (QID) | RESPIRATORY_TRACT | 1 refills | Status: DC | PRN

## 2020-01-24 MED ORDER — FLUTICASONE-SALMETEROL 100-50 MCG/DOSE IN AEPB: 1.0000 | INHALATION_SPRAY | Freq: Two times a day (BID) | RESPIRATORY_TRACT | 3 refills | Status: DC

## 2020-01-24 MED ORDER — LOSARTAN POTASSIUM-HCTZ 50-12.5 MG PO TABS: 1.0000 | ORAL_TABLET | Freq: Every day | ORAL | 0 refills | Status: DC

## 2020-01-24 MED ORDER — AMLODIPINE BESYLATE 10 MG PO TABS: 10.0000 mg | ORAL_TABLET | Freq: Every day | ORAL | 0 refills | Status: DC

## 2020-01-24 NOTE — Progress Notes (Signed)
Virtual Visit via Telephone Note Due to national recommendations of social distancing due to [place] 19, telehealth visit is felt to be most appropriate for this patient at this time.  I discussed the limitations, risks, security and privacy concerns of performing an evaluation and management service by telephone and the availability of in person appointments. I also discussed with the patient that there may be a patient responsible charge related to this service. The patient expressed understanding and agreed to proceed.    I connected with Kimberly Adkins on 01/24/20  at   3:10 PM EDT  EDT by telephone and verified that I am speaking with the correct person using two identifiers.   Consent I discussed the limitations, risks, security and privacy concerns of performing an evaluation and management service by telephone and the availability of in person appointments. I also discussed with the patient that there may be a patient responsible charge related to this service. The patient expressed understanding and agreed to proceed.   Location of Patient: Private Residence   Location of Provider: [place] [facility] [place] participating in Telemedicine visit: Kimberly Rankins FNP-BC [place]    History of Present Illness: Telemedicine visit for: [name]: "long time ago" PAP: 2010    Essential Hypertension Does not monitor her blood pressure at home. Taking amlodipine 10 mg daily and Hyzaar 50-12.5 mg daily as prescribed. Amlodipine was recently increased from 5-10 mg. May need to increase Hyzaar if BP elevated at next visit.  BP Readings from Last 3 Encounters:  09/29/18 (!) 164/96  04/28/17 (!) 163/74  01/20/16 159/81   GERD Has history of Hiatal hernia. Taking prevacid OTC which provides complete resolution of symptoms.   COPD Still smoking cigarettes 1 ppd. Started smoking at 64 years old. Not ready  to quit.   Goes to [facility] for depression and anxiety.    Past Medical History:  Diagnosis Date  . Anxiety   . Depression   . GERD (gastroesophageal reflux disease)   . Hyperlipidemia   . Hypertension   . Osteoarthritis   . PTSD (post-traumatic stress disorder) 2010    Past Surgical History:  Procedure Laterality Date  . ANTERIOR AND POSTERIOR REPAIR  2007  . BREAST BIOPSY  1997   right; benign  . TUBAL LIGATION  1994    Family History  Problem Relation Age of Onset  . Breast cancer Paternal Grandmother        Age 6  . Colon polyps Paternal Grandmother 21  . Colon polyps Maternal Grandfather 38  . Cancer Mother        Breast cancer  . Breast cancer Mother        Age 81  . Coronary artery disease Father   . Hypertension Father   . Breast cancer Paternal Aunt        Age 74's  . Cancer Maternal Grandmother        colon  . Heart attack Daughter     Social History   Socioeconomic History  . Marital status: Widowed    Spouse name: Not on file  . Number of children: Not on file  . Years of education: Not on file  . Highest education level: Not on file  Occupational History  . Occupation: unemplyed  Tobacco Use  . Smoking status: Current Every Day Smoker    Packs/day: 1.50    Years: 22.00    Pack  years: 33.00    Types: Cigarettes  . Smokeless tobacco: Never Used  Substance and Sexual Activity  . Alcohol use: Yes  . Drug use: No  . Sexual activity: Never    Birth control/protection: Post-menopausal  Other Topics Concern  . Not on file  Social History Narrative  . Not on file   Social Determinants of Health   Financial Resource Strain:   . Difficulty of Paying Living Expenses:   Food Insecurity:   . Worried About Charity fundraiser in the Last Year:   . Arboriculturist in the Last Year:   Transportation Needs:   . Film/video editor (Medical):   Marland Kitchen Lack of Transportation (Non-Medical):   Physical Activity:   . Days of Exercise per Week:   .  Minutes of Exercise per Session:   Stress:   . Feeling of Stress :   Social Connections:   . Frequency of Communication with Friends and Family:   . Frequency of Social Gatherings with Friends and Family:   . Attends Religious Services:   . Active Member of Clubs or Organizations:   . Attends Archivist Meetings:   Marland Kitchen Marital Status:      Observations/Objective: Awake, alert and oriented x 3   Review of Systems  Constitutional: Negative for fever, malaise/fatigue and weight loss.  HENT: Negative.  Negative for nosebleeds.   Eyes: Negative.  Negative for blurred vision, double vision and photophobia.  Respiratory: Negative.  Negative for cough, shortness of breath and wheezing.   Cardiovascular: Negative.  Negative for chest pain, palpitations and leg swelling.  Gastrointestinal: Negative.  Negative for heartburn, nausea and vomiting.  Musculoskeletal: Negative.  Negative for myalgias.  Neurological: Negative.  Negative for dizziness, focal weakness, seizures and headaches.  Psychiatric/Behavioral: Positive for depression. Negative for suicidal ideas. The patient is nervous/anxious.     Assessment and Plan: [name] was seen today for new patient (initial visit).  Diagnoses and all orders for this visit:  Encounter to establish care  Essential hypertension -     amLODipine (NORVASC) 10 MG tablet; Take 1 tablet (10 mg total) by mouth daily. -     losartan-hydrochlorothiazide (HYZAAR) 50-12.5 MG tablet; Take 1 tablet by mouth daily. -     CMP14+EGFR; Future Continue all antihypertensives as prescribed.  Remember to bring in your blood pressure log with you for your follow up appointment.  DASH/Mediterranean Diets are healthier choices for HTN.    Chronic bronchitis, unspecified chronic bronchitis type (HCC) -     Fluticasone-Salmeterol (ADVAIR DISKUS) 100-50 MCG/DOSE AEPB; Inhale 1 puff into the lungs 2 (two) times daily. -     albuterol (VENTOLIN HFA) 108 (90 Base)  MCG/ACT inhaler; Inhale 2 puffs into the lungs every 6 (six) hours as needed for wheezing or shortness of breath.   Mixed hyperlipidemia -     Lipid panel; Future INSTRUCTIONS: Work on a low fat, heart healthy diet and participate in regular aerobic exercise program by working out at least 150 minutes per week; 5 days a week-30 minutes per day. Avoid red meat/beef/steak,  fried foods. junk foods, sodas, sugary drinks, unhealthy snacking, alcohol and smoking.  Drink at least 80 oz of water per day and monitor your carbohydrate intake daily.    Encounter for hepatitis C screening test for low risk patient -     Hepatitis C Antibody; Future  Encounter for screening for HIV -     HIV antibody (with reflex); Future  Colon cancer screening -     Fecal occult blood, imunochemical([facility]); Future  Encounter for screening for diabetes mellitus -     Hemoglobin A1c; Future  Screening for deficiency anemia -     CBC; Future     Follow Up Instructions Return in about 4 weeks (around 02/21/2020) for BP recheck.     I discussed the assessment and treatment plan with the patient. The patient was provided an opportunity to ask questions and all were answered. The patient agreed with the plan and demonstrated an understanding of the instructions.   The patient was advised to call back or seek an in-person evaluation if the symptoms worsen or if the condition fails to improve as anticipated.  I provided 20 minutes of non-face-to-face time during this encounter including median intraservice time, reviewing previous notes, labs, imaging, medications and explaining diagnosis and management.  Gildardo Pounds, FNP-BC

## 2020-01-25 ENCOUNTER — Other Ambulatory Visit: Payer: Self-pay | Admitting: Nurse Practitioner

## 2020-01-25 DIAGNOSIS — J42 Unspecified chronic bronchitis: Secondary | ICD-10-CM

## 2020-01-25 MED ORDER — FLUTICASONE-SALMETEROL 100-50 MCG/DOSE IN AEPB: 1.0000 | INHALATION_SPRAY | Freq: Two times a day (BID) | RESPIRATORY_TRACT | 6 refills | Status: DC

## 2020-01-25 MED FILL — LOSARTAN-HCTZ 50-12.5 MG TA: 50-12.5 | 30 days supply | Qty: 30 | Fill #0

## 2020-01-25 MED FILL — ALBUTEROL SULFATE HFA 108 (: 108 (90 BAS | 25 days supply | Qty: 18 | Fill #0

## 2020-01-25 MED FILL — AMLODIPINE BESYLATE 10 MG T: 10 | 30 days supply | Qty: 30 | Fill #0

## 2020-01-25 MED FILL — FLUTICASONE-SALMETEROL 100-: 100-50 | 30 days supply | Qty: 60 | Fill #0

## 2020-01-31 ENCOUNTER — Other Ambulatory Visit: Payer: Self-pay

## 2020-02-07 ENCOUNTER — Other Ambulatory Visit: Payer: Self-pay

## 2020-02-07 ENCOUNTER — Ambulatory Visit: Payer: Self-pay | Attending: Nurse Practitioner

## 2020-02-07 DIAGNOSIS — Z1159 Encounter for screening for other viral diseases: Secondary | ICD-10-CM

## 2020-02-07 DIAGNOSIS — Z131 Encounter for screening for diabetes mellitus: Secondary | ICD-10-CM

## 2020-02-07 DIAGNOSIS — Z114 Encounter for screening for human immunodeficiency virus [HIV]: Secondary | ICD-10-CM

## 2020-02-07 DIAGNOSIS — E782 Mixed hyperlipidemia: Secondary | ICD-10-CM

## 2020-02-07 DIAGNOSIS — Z130 Encounter for screening for diseases of the blood and blood-forming organs and certain disorders involving the immune mechanism: Secondary | ICD-10-CM

## 2020-02-07 DIAGNOSIS — I10 Essential (primary) hypertension: Secondary | ICD-10-CM

## 2020-02-08 ENCOUNTER — Telehealth: Payer: Self-pay | Admitting: Nurse Practitioner

## 2020-02-08 ENCOUNTER — Other Ambulatory Visit: Payer: Self-pay | Admitting: Nurse Practitioner

## 2020-02-08 LAB — CMP14+EGFR
ALT: 44 IU/L — ABNORMAL HIGH (ref 0–32)
AST: 52 IU/L — ABNORMAL HIGH (ref 0–40)
Albumin/Globulin Ratio: 1.7 (ref 1.2–2.2)
Albumin: 4.4 g/dL (ref 3.8–4.8)
Alkaline Phosphatase: 114 IU/L (ref 39–117)
BUN/Creatinine Ratio: 4 — ABNORMAL LOW (ref 12–28)
BUN: 3 mg/dL — ABNORMAL LOW (ref 8–27)
Bilirubin Total: 0.4 mg/dL (ref 0.0–1.2)
CO2: 23 mmol/L (ref 20–29)
Calcium: 9.2 mg/dL (ref 8.7–10.3)
Chloride: 92 mmol/L — ABNORMAL LOW (ref 96–106)
Creatinine, Ser: 0.79 mg/dL (ref 0.57–1.00)
GFR calc Af Amer: 93 mL/min/{1.73_m2} (ref >59)
GFR calc non Af Amer: 81 mL/min/{1.73_m2} (ref >59)
Globulin, Total: 2.6 g/dL (ref 1.5–4.5)
Glucose: 96 mg/dL (ref 65–99)
Potassium: 3.5 mmol/L (ref 3.5–5.2)
Sodium: 133 mmol/L — ABNORMAL LOW (ref 134–144)
Total Protein: 7 g/dL (ref 6.0–8.5)

## 2020-02-08 LAB — CBC
Hematocrit: 35.9 % (ref 34.0–46.6)
Hemoglobin: 12.3 g/dL (ref 11.1–15.9)
MCH: 29.4 pg (ref 26.6–33.0)
MCHC: 34.3 g/dL (ref 31.5–35.7)
MCV: 86 fL (ref 79–97)
Platelets: 384 10*3/uL (ref 150–450)
RBC: 4.19 x10E6/uL (ref 3.77–5.28)
RDW: 14 % (ref 11.7–15.4)
WBC: 9.7 10*3/uL (ref 3.4–10.8)

## 2020-02-08 LAB — LIPID PANEL
Chol/HDL Ratio: 8.7 ratio — ABNORMAL HIGH (ref 0.0–4.4)
Cholesterol, Total: 286 mg/dL — ABNORMAL HIGH (ref 100–199)
HDL: 33 mg/dL — ABNORMAL LOW (ref >39)
LDL Chol Calc (NIH): 182 mg/dL — ABNORMAL HIGH (ref 0–99)
Triglycerides: 360 mg/dL — ABNORMAL HIGH (ref 0–149)
VLDL Cholesterol Cal: 71 mg/dL — ABNORMAL HIGH (ref 5–40)

## 2020-02-08 LAB — HEMOGLOBIN A1C
Est. average glucose Bld gHb Est-mCnc: 120 mg/dL
Hgb A1c MFr Bld: 5.8 % — ABNORMAL HIGH (ref 4.8–5.6)

## 2020-02-08 LAB — HIV ANTIBODY (ROUTINE TESTING W REFLEX): HIV Screen 4th Generation wRfx: NONREACTIVE

## 2020-02-08 LAB — HEPATITIS C ANTIBODY: Hep C Virus Ab: 0.1 s/co ratio (ref 0.0–0.9)

## 2020-02-08 MED ORDER — LOSARTAN POTASSIUM 50 MG PO TABS: 50.0000 mg | ORAL_TABLET | Freq: Every day | ORAL | 3 refills | Status: DC

## 2020-02-08 MED ORDER — ATORVASTATIN CALCIUM 40 MG PO TABS
40.0000 mg | ORAL_TABLET | Freq: Every day | ORAL | 3 refills | Status: DC
Start: 2020-02-08 — End: 2021-01-01

## 2020-02-08 MED FILL — LOSARTAN POTASSIUM 50 MG TA: 50 | 30 days supply | Qty: 30 | Fill #0

## 2020-02-08 MED FILL — ATORVASTATIN CALCIUM 40 MG: 40 | 30 days supply | Qty: 30 | Fill #0

## 2020-02-08 NOTE — Telephone Encounter (Signed)
Patient called and requested for her lab results. Patient was identified by 2 patient identifiers. Patient was informed of lab results verbalized understanding and had no further questions or concerns at this time.

## 2020-02-16 ENCOUNTER — Ambulatory Visit: Payer: Self-pay

## 2020-02-22 ENCOUNTER — Ambulatory Visit: Payer: Self-pay | Admitting: Nurse Practitioner

## 2020-02-28 ENCOUNTER — Other Ambulatory Visit: Payer: Self-pay

## 2020-02-28 ENCOUNTER — Ambulatory Visit: Payer: Self-pay | Attending: Nurse Practitioner

## 2020-03-08 ENCOUNTER — Encounter: Payer: Self-pay | Admitting: Nurse Practitioner

## 2020-03-08 ENCOUNTER — Ambulatory Visit: Payer: Self-pay | Attending: Nurse Practitioner | Admitting: Nurse Practitioner

## 2020-03-08 ENCOUNTER — Other Ambulatory Visit: Payer: Self-pay

## 2020-03-08 VITALS — BP 139/75 | HR 92 | Temp 97.9°F | Ht 64.17 in | Wt 204.6 lb

## 2020-03-08 DIAGNOSIS — J42 Unspecified chronic bronchitis: Secondary | ICD-10-CM

## 2020-03-08 DIAGNOSIS — R7303 Prediabetes: Secondary | ICD-10-CM

## 2020-03-08 DIAGNOSIS — I10 Essential (primary) hypertension: Secondary | ICD-10-CM

## 2020-03-08 DIAGNOSIS — M2550 Pain in unspecified joint: Secondary | ICD-10-CM

## 2020-03-08 LAB — GLUCOSE, POCT (MANUAL RESULT ENTRY): POC Glucose: 118 mg/dL — AB (ref 70–99)

## 2020-03-08 MED ORDER — MELOXICAM 7.5 MG PO TABS: 7.5000 mg | ORAL_TABLET | Freq: Every day | ORAL | 1 refills | Status: DC

## 2020-03-08 MED ORDER — FLUTICASONE-SALMETEROL 100-50 MCG/DOSE IN AEPB: 1.0000 | INHALATION_SPRAY | Freq: Two times a day (BID) | RESPIRATORY_TRACT | 6 refills | Status: DC

## 2020-03-08 MED ORDER — LOSARTAN POTASSIUM 100 MG PO TABS: 100.0000 mg | ORAL_TABLET | Freq: Every day | ORAL | 1 refills | Status: DC

## 2020-03-08 MED FILL — FLUTICASONE-SALMETEROL 100-: 100-50 | 30 days supply | Qty: 60 | Fill #0

## 2020-03-08 MED FILL — MELOXICAM 7.5 MG TABLET: 7.5 | 30 days supply | Qty: 30 | Fill #0

## 2020-03-08 MED FILL — LOSARTAN POTASSIUM 100 MG T: 100 | 30 days supply | Qty: 30 | Fill #0

## 2020-03-08 NOTE — Progress Notes (Signed)
Assessment & Plan:  Kimberly Adkins was seen today for blood pressure check.  Diagnoses and all orders for this visit:  Essential hypertension -     losartan (COZAAR) 100 MG tablet; Take 1 tablet (100 mg total) by mouth daily. Continue all antihypertensives as prescribed.  Remember to bring in your blood pressure log with you for your follow up appointment.  DASH/Mediterranean Diets are healthier choices for HTN.    Prediabetes -     Glucose (CBG)  Arthralgia of multiple joints -     meloxicam (MOBIC) 7.5 MG tablet; Take 1 tablet (7.5 mg total) by  mouth daily. Work on losing weight to help reduce back pain. May alternate with heat and ice application for pain relief. May also alternate with acetaminophen  as prescribed for arthralgias. Other alternatives include massage, acupuncture and water aerobics.  You must stay active and avoid a sedentary lifestyle.    Chronic bronchitis, unspecified chronic bronchitis type (HCC) -     Fluticasone-Salmeterol (ADVAIR DISKUS) 100-50 MCG/DOSE AEPB; Inhale 1 puff into the lungs 2 (two) times daily.    Patient has been counseled on age-appropriate routine health concerns for screening and prevention. These are reviewed and up-to-date. Referrals have been placed accordingly. Immunizations are up-to-date or declined.    Subjective:   Chief Complaint  Patient presents with  . Blood Pressure Check    Pt. is here for blood pressure check. Pt. stated both of her feet are swollen, she think it may be from her blood pressure medication.    HPI Kimberly Adkins 64 y.o. female presents to office today for follow up.  Essential Hypertension Well controlled. She does endorse increased edema of both feet, ankles and lower legs. Will stop amlodipine 10 mg and increase losartan from 50 mg to 100 mg daily. I previously stopped her HCTZ due to electrolyte abnormalities.  Denies chest pain, shortness of breath, palpitations, lightheadedness, dizziness, headaches or  BLE edema.  BP Readings from Last 3 Encounters:  03/08/20 139/75  09/29/18 (!) 164/96  04/28/17 (!) 163/74   Joint/Muscle Pain: Patient complains of arthralgias for which has been present for several months. Pain is located in multiple joints, is described as aching, and is intermittent, moderate .  Associated symptoms include: decreased range of motion.  The patient has tried NSAIDs for pain, with minimal relief.  Related to injury:   no.  Prediabetes Well controlled with current diet. Lab Results  Component Value Date   HGBA1C 5.8 (H) 02/07/2020   Review of Systems  Constitutional: Negative for fever, malaise/fatigue and weight loss.  HENT: Negative.  Negative for nosebleeds.   Eyes: Negative.  Negative for blurred vision, double vision and photophobia.  Respiratory: Negative.  Negative for cough and shortness of breath.   Cardiovascular: Negative.  Negative for chest pain, palpitations and leg swelling.  Gastrointestinal: Negative.  Negative for heartburn, nausea and vomiting.  Musculoskeletal: Negative.  Negative for myalgias.  Neurological: Negative.  Negative for dizziness, focal weakness, seizures and headaches.  Psychiatric/Behavioral: Negative.  Negative for suicidal ideas.    Past Medical History:  Diagnosis Date  . Anxiety   . Depression   . GERD (gastroesophageal reflux disease)   . Hyperlipidemia   . Hypertension   . Osteoarthritis   . PTSD (post-traumatic stress disorder) 2010    Past Surgical History:  Procedure Laterality Date  . ANTERIOR AND POSTERIOR REPAIR  2007  . BREAST BIOPSY  1997   right; benign  . TUBAL LIGATION  1994    Family History  Problem Relation Age of Onset  . Breast cancer Paternal Grandmother        Age 62  . Colon polyps Paternal Grandmother 51  . Colon polyps Maternal Grandfather 60  . Cancer Mother        Breast cancer  . Breast cancer Mother        Age 45  . Coronary artery disease Father   . Hypertension Father   .  Breast cancer Paternal Aunt        Age 82's  . Cancer Maternal Grandmother        colon  . Heart attack Daughter     Social History Reviewed with no changes to be made today.   Outpatient Medications Prior to Visit  Medication Sig Dispense Refill  . acetaminophen (TYLENOL) 500 MG tablet Take 1,000 mg by mouth every 4 (four) hours as needed for mild pain.    Marland Kitchen albuterol (VENTOLIN HFA) 108 (90 Base) MCG/ACT inhaler Inhale 2 puffs into the lungs every 6 (six) hours as needed for wheezing or shortness of breath. 18 g 1  . ALPRAZolam (XANAX) 1 MG tablet Take 1 mg by mouth 3 (three) times daily.     Marland Kitchen atorvastatin (LIPITOR) 40 MG tablet Take 1 tablet (40 mg total) by mouth daily. 90 tablet 3  . diphenhydrAMINE (BENADRYL) 25 mg capsule Take 25 mg by mouth at bedtime.    Marland Kitchen FLUoxetine (PROZAC) 20 MG capsule Take 60 mg by mouth daily.     Marland Kitchen ibuprofen (ADVIL,MOTRIN) 200 MG tablet Take 400 mg by mouth every 4 (four) hours as needed for headache or mild pain.    Marland Kitchen amLODipine (NORVASC) 10 MG tablet Take 1 tablet (10 mg total) by mouth daily. 90 tablet 0  . losartan (COZAAR) 50 MG tablet Take 1 tablet (50 mg total) by mouth daily. 90 tablet 3  . Fluticasone-Salmeterol (ADVAIR DISKUS) 100-50 MCG/DOSE AEPB Inhale 1 puff into the lungs 2 (two) times daily. 60 each 6   No facility-administered medications prior to visit.    Allergies  Allergen Reactions  . Hydromorphone Itching       Objective:    BP 139/75 (BP Location: Left Arm, Patient Position: Sitting, Cuff Size: Normal)   Pulse 92   Temp 97.9 F (36.6 C) (Temporal)   Ht 5' 4.17" (1.63 m)   Wt 204 lb 9.6 oz (92.8 kg)   SpO2 95%   BMI 34.93 kg/m  Wt Readings from Last 3 Encounters:  03/08/20 204 lb 9.6 oz (92.8 kg)  04/28/17 144 lb (65.3 kg)  08/08/14 160 lb 1.9 oz (72.6 kg)    Physical Exam Vitals and nursing note reviewed.  Constitutional:      Appearance: She is well-developed.  HENT:     Head: Normocephalic and atraumatic.    Cardiovascular:     Rate and Rhythm: Normal rate and regular rhythm.     Heart sounds: Normal heart sounds. No murmur. No friction rub. No gallop.   Pulmonary:     Effort: Pulmonary effort is normal. No tachypnea or respiratory distress.     Breath sounds: Normal breath sounds. No decreased breath sounds, wheezing, rhonchi or rales.  Chest:     Chest wall: No tenderness.  Abdominal:     General: Bowel sounds are normal.     Palpations: Abdomen is soft.  Musculoskeletal:        General: Normal range of motion.     Cervical  back: Normal range of motion.     Right lower leg: Edema present.     Left lower leg: Edema present.     Comments: NON PITTING  Skin:    General: Skin is warm and dry.  Neurological:     Mental Status: She is alert and oriented to person, place, and time.     Coordination: Coordination normal.  Psychiatric:        Behavior: Behavior normal. Behavior is cooperative.        Thought Content: Thought content normal.        Judgment: Judgment normal.          Patient has been counseled extensively about nutrition and exercise as well as the importance of adherence with medications and regular follow-up. The patient was given clear instructions to go to ER or return to medical center if symptoms don't improve, worsen or new problems develop. The patient verbalized understanding.   Follow-up: Return in about 2 weeks (around 03/22/2020) for BP recheck with luke and come get me to look at her feet(swollen).   [name], FNP-BC [facility] [place], [place] [phone]   03/08/2020, 10:21 PM

## 2020-03-16 MED FILL — ATORVASTATIN CALCIUM 40 MG: 40 | 30 days supply | Qty: 30 | Fill #1

## 2020-03-22 ENCOUNTER — Other Ambulatory Visit: Payer: Self-pay

## 2020-03-22 ENCOUNTER — Ambulatory Visit: Payer: Self-pay | Admitting: Nurse Practitioner

## 2020-03-22 ENCOUNTER — Encounter: Payer: Self-pay | Admitting: Pharmacist

## 2020-03-22 ENCOUNTER — Ambulatory Visit: Payer: Self-pay | Attending: Nurse Practitioner | Admitting: Pharmacist

## 2020-03-22 VITALS — BP 141/87

## 2020-03-22 DIAGNOSIS — I10 Essential (primary) hypertension: Secondary | ICD-10-CM

## 2020-03-22 NOTE — Progress Notes (Signed)
   S:    PCP: Zelda   Patient arrives in good spirits. Presents to the clinic for hypertension evaluation, counseling, and management.  Patient was referred and last seen by Primary Care Provider on 03/08/2020. Amlodipine was discontinued d/t LE edema. Losartan dose increased to 100 mg daily. Of note, patient reports increased stress for the past several days. Reports that her ankle swelling stopped after stopping amlodipine.   Medication adherence reported,   Current BP Medications include:  Losartan 100 mg daily.   Antihypertensives tried in the past include: HCTZ (hyponatremia), amlodipine (LE edema)  Dietary habits include: limits salt; admits to caffeine consumption  Exercise habits include: none  Family / Social history:  - CAD, MI - Tobacco: current every day smoker  - Alcohol: reports use   O:  Vitals:   03/22/20 1456  BP: (!) 141/87    Last 3 Office BP readings: BP Readings from Last 3 Encounters:  03/22/20 (!) 141/87  03/08/20 139/75  09/29/18 (!) 164/96    BMET    Component Value Date/Time   NA 133 (L) 02/07/2020 0911   K 3.5 02/07/2020 0911   CL 92 (L) 02/07/2020 0911   CO2 23 02/07/2020 0911   GLUCOSE 96 02/07/2020 0911   GLUCOSE 101 (H) 04/08/2013 0954   BUN 3 (L) 02/07/2020 0911   CREATININE 0.79 02/07/2020 0911   CALCIUM 9.2 02/07/2020 0911   GFRNONAA 81 02/07/2020 0911   GFRAA 93 02/07/2020 0911    Renal function: CrCl cannot be calculated (Patient's most recent lab result is older than the maximum 21 days allowed.).  Clinical ASCVD: No  The 10-year ASCVD risk score Denman George DC Jr., et al., 2013) is: 21.1%   Values used to calculate the score:     Age: 64 years     Sex: Female     Is Non-Hispanic African American: No     Diabetic: No     Tobacco smoker: Yes     Systolic Blood Pressure: 141 mmHg     Is BP treated: Yes     HDL Cholesterol: 33 mg/dL     Total Cholesterol: 286 mg/dL   A/P: Hypertension longstanding currently above goal on  current medications. BP Goal = < 130/80 mmHg. Medication adherence reported. Will hold off on any changes - pt is seeing Zelda again in 6 weeks.  -Continued current regimen.  -Counseled on lifestyle modifications for blood pressure control including reduced dietary sodium, increased exercise, adequate sleep.  Results reviewed and written information provided.   Total time in face-to-face counseling 15 minutes.   F/U Clinic Visit in 6 weeks with Zelda.   Butch Penny, PharmD, CPP Clinical Pharmacist [facility] [phone]

## 2020-03-31 ENCOUNTER — Other Ambulatory Visit: Payer: Self-pay

## 2020-03-31 ENCOUNTER — Telehealth (INDEPENDENT_AMBULATORY_CARE_PROVIDER_SITE_OTHER): Payer: No Payment, Other | Admitting: Psychiatric/Mental Health

## 2020-03-31 DIAGNOSIS — F331 Major depressive disorder, recurrent, moderate: Secondary | ICD-10-CM

## 2020-03-31 DIAGNOSIS — F411 Generalized anxiety disorder: Secondary | ICD-10-CM | POA: Diagnosis not present

## 2020-03-31 MED ORDER — ALPRAZOLAM 1 MG PO TABS: 1.0000 mg | ORAL_TABLET | Freq: Three times a day (TID) | ORAL | 0 refills | Status: DC

## 2020-03-31 NOTE — Progress Notes (Signed)
Psychiatric Initial Adult Assessment  Virtual Visit via Video Note   I connected with Titus Mould on 03/31/20 by a video enabled telemedicine application and verified that I am speaking with the correct person using two identifiers.   Location: Patient: Home  Provider: [place] home office   I discussed the limitations of evaluation and management by telemedicine and the availability of in person appointments. The patient expressed understanding and agreed to proceed. I provided 60 minutes of non-face-to-face time during this encounter.     Patient Identification: Kimberly Adkins DOBRATZ MRN:  [id number] Date of Evaluation:  04/01/2020 Referral Source: Vesta Mixer Chief Complaint:  "I feel fine, I just need my meds" Visit Diagnosis:    ICD-10-CM   1. Moderate episode of recurrent major depressive disorder (HCC)  F33.1 FLUoxetine (PROZAC) 20 MG capsule  2. Anxiety state  F41.1 ALPRAZolam (XANAX) 1 MG tablet    History of Present Illness: Kimberly Adkins  A 64 year old female seen today for initial psych evaluation.  Patient was referred to outpatient psychiatry by [facility] for medication management.  Patient reports that she has run out of medication. Pt has a history of GAD, PTSD and MDD.  Patient has a very traumatic past which includes watching her husband shoot himself in the head and daughter dying. She report extreme anxiety due to running out of her medication. She denies, SI, HI and AVH.  She claims this is the only thing that keeps her together.  Writer refilled patient's prescription and patient is agreeable to follow-up in 12 weeks.    Associated Signs/Symptoms: Depression Symptoms:  anxiety, (Hypo) Manic Symptoms:  Irritable Mood, Anxiety Symptoms:  Panic Symptoms, Psychotic Symptoms:  na PTSD Symptoms: Had a traumatic exposure:  PTSD  Past Psychiatric History: GAD, MDD  Previous Psychotropic Medications: No   Substance Abuse History in the last 12 months:  No.  Consequences of  Substance Abuse: NA  Past Medical History:  Past Medical History:  Diagnosis Date  . Anxiety   . Depression   . GERD (gastroesophageal reflux disease)   . Hyperlipidemia   . Hypertension   . Osteoarthritis   . PTSD (post-traumatic stress disorder) 2010    Past Surgical History:  Procedure Laterality Date  . ANTERIOR AND POSTERIOR REPAIR  2007  . BREAST BIOPSY  1997   right; benign  . TUBAL LIGATION  1994    Family Psychiatric History: unknown  Family History:  Family History  Problem Relation Age of Onset  . Breast cancer Paternal Grandmother        Age 70  . Colon polyps Paternal Grandmother 73  . Colon polyps Maternal Grandfather 60  . Cancer Mother        Breast cancer  . Breast cancer Mother        Age 34  . Coronary artery disease Father   . Hypertension Father   . Breast cancer Paternal Aunt        Age 56's  . Cancer Maternal Grandmother        colon  . Heart attack Daughter     Social History:   Social History   Socioeconomic History  . Marital status: Widowed    Spouse name: Not on file  . Number of children: Not on file  . Years of education: Not on file  . Highest education level: Not on file  Occupational History  . Occupation: unemplyed  Tobacco Use  . Smoking status: Current Every Day Smoker    Packs/day:  1.50    Years: 22.00    Pack years: 33.00    Types: Cigarettes  . Smokeless tobacco: Never Used  Vaping Use  . Vaping Use: Never used  Substance and Sexual Activity  . Alcohol use: Yes  . Drug use: No  . Sexual activity: Never    Birth control/protection: Post-menopausal  Other Topics Concern  . Not on file  Social History Narrative  . Not on file   Social Determinants of Health   Financial Resource Strain:   . Difficulty of Paying Living Expenses:   Food Insecurity:   . Worried About Charity fundraiser in the Last Year:   . Arboriculturist in the Last Year:   Transportation Needs:   . Film/video editor (Medical):    Marland Kitchen Lack of Transportation (Non-Medical):   Physical Activity:   . Days of Exercise per Week:   . Minutes of Exercise per Session:   Stress:   . Feeling of Stress :   Social Connections:   . Frequency of Communication with Friends and Family:   . Frequency of Social Gatherings with Friends and Family:   . Attends Religious Services:   . Active Member of Clubs or Organizations:   . Attends Archivist Meetings:   Marland Kitchen Marital Status:     Additional Social History: unknown  Allergies:   Allergies  Allergen Reactions  . Hydromorphone Itching    Metabolic Disorder Labs: Lab Results  Component Value Date   HGBA1C 5.8 (H) 02/07/2020   No results found for: PROLACTIN Lab Results  Component Value Date   CHOL 286 (H) 02/07/2020   TRIG 360 (H) 02/07/2020   HDL 33 (L) 02/07/2020   CHOLHDL 8.7 (H) 02/07/2020   VLDL 39.2 04/08/2013   LDLCALC 182 (H) 02/07/2020   Lab Results  Component Value Date   TSH 1.17 04/08/2013    Therapeutic Level Labs: No results found for: LITHIUM No results found for: CBMZ No results found for: VALPROATE  Current Medications: Current Outpatient Medications  Medication Sig Dispense Refill  . acetaminophen (TYLENOL) 500 MG tablet Take 1,000 mg by mouth every 4 (four) hours as needed for mild pain.    Marland Kitchen albuterol (VENTOLIN HFA) 108 (90 Base) MCG/ACT inhaler Inhale 2 puffs into the lungs every 6 (six) hours as needed for wheezing or shortness of breath. 18 g 1  . ALPRAZolam (XANAX) 1 MG tablet Take 1 tablet (1 mg total) by mouth 3 (three) times daily. 90 tablet 0  . atorvastatin (LIPITOR) 40 MG tablet Take 1 tablet (40 mg total) by mouth daily. 90 tablet 3  . diphenhydrAMINE (BENADRYL) 25 mg capsule Take 25 mg by mouth at bedtime.    Marland Kitchen FLUoxetine (PROZAC) 20 MG capsule Take 3 capsules (60 mg total) by mouth daily. 90 capsule 2  . Fluticasone-Salmeterol (ADVAIR DISKUS) 100-50 MCG/DOSE AEPB Inhale 1 puff into the lungs 2 (two) times daily. 60  each 6  . ibuprofen (ADVIL,MOTRIN) 200 MG tablet Take 400 mg by mouth every 4 (four) hours as needed for headache or mild pain.    Marland Kitchen losartan (COZAAR) 100 MG tablet Take 1 tablet (100 mg total) by mouth daily. 90 tablet 1  . meloxicam (MOBIC) 7.5 MG tablet Take 1 tablet (7.5 mg total) by mouth daily. 30 tablet 1   No current facility-administered medications for this visit.    Musculoskeletal: Strength & Muscle Tone: within normal limits Gait & Station: normal Patient leans: N/A  Psychiatric  Specialty Exam: Review of Systems  There were no vitals taken for this visit.There is no height or weight on file to calculate BMI.  General Appearance: NA  Eye Contact:  unable to assess  Speech:  Clear and Coherent  Volume:  Increased  Mood:  Anxious  Affect:  Congruent and Full Range  Thought Process:  Coherent and Descriptions of Associations: Intact  Orientation:  Full (Time, Place, and Person)  Thought Content:  WDL  Suicidal Thoughts:  No  Homicidal Thoughts:  No  Memory:  Recent;   Fair  Judgement:  Fair  Insight:  Fair  Psychomotor Activity:  Normal  Concentration:  Concentration: Fair  Recall:  [employer] of Knowledge:Fair  Language: Fair  Akathisia:  NA  Handed:  Right  AIMS (if indicated):  not done  Assets:  Communication Skills Desire for Improvement Resilience  ADL's:  Intact  Cognition: WNL  Sleep:  Fair   Screenings:   Assessment and Plan: Continue patient's current medication regimen as it is effective and patient will follow-up in 12 weeks.       1. Moderate episode of recurrent major depressive disorder (HCC)  F33.1 FLUoxetine (PROZAC) 20 MG capsule  2. Anxiety state  F41.1 ALPRAZolam (XANAX) 1 MG tablet    Jearld Lesch, NP 6/19/20216:44 AM

## 2020-04-01 ENCOUNTER — Encounter (HOSPITAL_COMMUNITY): Payer: Self-pay | Admitting: Psychiatric/Mental Health

## 2020-04-01 MED ORDER — FLUOXETINE HCL 20 MG PO CAPS: 60.0000 mg | ORAL_CAPSULE | Freq: Every day | ORAL | 2 refills | Status: DC

## 2020-04-10 MED FILL — LOSARTAN POTASSIUM 100 MG T: 100 | 30 days supply | Qty: 30 | Fill #1

## 2020-04-24 MED FILL — ATORVASTATIN CALCIUM 40 MG: 40 | 30 days supply | Qty: 30 | Fill #2

## 2020-04-27 ENCOUNTER — Telehealth (HOSPITAL_COMMUNITY): Payer: No Payment, Other | Admitting: Psychiatric/Mental Health

## 2020-04-27 ENCOUNTER — Other Ambulatory Visit: Payer: Self-pay

## 2020-04-28 ENCOUNTER — Other Ambulatory Visit: Payer: Self-pay

## 2020-04-28 ENCOUNTER — Telehealth (INDEPENDENT_AMBULATORY_CARE_PROVIDER_SITE_OTHER): Payer: No Payment, Other | Admitting: Psychiatric/Mental Health

## 2020-04-28 ENCOUNTER — Encounter (HOSPITAL_COMMUNITY): Payer: Self-pay | Admitting: Psychiatric/Mental Health

## 2020-04-28 ENCOUNTER — Ambulatory Visit: Payer: Self-pay | Attending: Nurse Practitioner | Admitting: Nurse Practitioner

## 2020-04-28 ENCOUNTER — Encounter: Payer: Self-pay | Admitting: Nurse Practitioner

## 2020-04-28 ENCOUNTER — Telehealth (HOSPITAL_COMMUNITY): Payer: Self-pay | Admitting: *Deleted

## 2020-04-28 VITALS — BP 178/99 | HR 89 | Temp 97.7°F | Ht 64.17 in | Wt 204.2 lb

## 2020-04-28 DIAGNOSIS — F331 Major depressive disorder, recurrent, moderate: Secondary | ICD-10-CM

## 2020-04-28 DIAGNOSIS — F1320 Sedative, hypnotic or anxiolytic dependence, uncomplicated: Secondary | ICD-10-CM | POA: Diagnosis not present

## 2020-04-28 DIAGNOSIS — F411 Generalized anxiety disorder: Secondary | ICD-10-CM | POA: Diagnosis not present

## 2020-04-28 DIAGNOSIS — I10 Essential (primary) hypertension: Secondary | ICD-10-CM

## 2020-04-28 DIAGNOSIS — R7303 Prediabetes: Secondary | ICD-10-CM

## 2020-04-28 LAB — GLUCOSE, POCT (MANUAL RESULT ENTRY): POC Glucose: 124 mg/dl — AB (ref 70–99)

## 2020-04-28 MED ORDER — FLUOXETINE HCL 20 MG PO CAPS: 60.0000 mg | ORAL_CAPSULE | Freq: Every day | ORAL | 2 refills | Status: DC

## 2020-04-28 MED ORDER — ALPRAZOLAM 1 MG PO TABS: 1.0000 mg | ORAL_TABLET | Freq: Three times a day (TID) | ORAL | 0 refills | Status: DC

## 2020-04-28 MED ORDER — LOSARTAN POTASSIUM 100 MG PO TABS: 100.0000 mg | ORAL_TABLET | Freq: Every day | ORAL | 1 refills | Status: DC

## 2020-04-28 MED ORDER — AMLODIPINE BESYLATE 5 MG PO TABS: 5.0000 mg | ORAL_TABLET | Freq: Every day | ORAL | 3 refills | Status: DC

## 2020-04-28 MED ORDER — CLONAZEPAM 1 MG PO TABS: 1.0000 mg | ORAL_TABLET | Freq: Three times a day (TID) | ORAL | 2 refills | Status: DC | PRN

## 2020-04-28 MED FILL — AMLODIPINE BESYLATE 5 MG TA: 5 | 30 days supply | Qty: 30 | Fill #0

## 2020-04-28 MED FILL — LOSARTAN POTASSIUM 100 MG T: 100 | 30 days supply | Qty: 30 | Fill #2

## 2020-04-28 NOTE — Progress Notes (Signed)
Patient ID: Kimberly Adkins, female   DOB: 01-28-1956, 64 y.o.   MRN: [id number]

## 2020-04-28 NOTE — Telephone Encounter (Signed)
Patient called after she was unsuccessful with two attempts to connect for her virtual appt today with Ms Durwin Nora NP. She states she is nearly out of her Xanax which scares her because she was told in the past she could die or have a seizure if she stops it suddenly. She is currently taking 3mg  daily and reports taking Xanax for 30 plus years. Attempted to make her a face to face appt with a different provider yesterday pm when she called but due to transportation problems she was unable to get her yesterday pm. She was rescheduled with Ms NP for this am at 930 virtually for a med check and RX. She does now have an appt in one week with Durwin Nora NP in person to accommodate her needs.

## 2020-04-28 NOTE — Progress Notes (Signed)
BH MD/PA/NP OP Progress Note Virtual Visit via Video Note  I connected with Kimberly Adkins  on 04/28/20  by a video enabled telemedicine application and verified that I am speaking with the correct person using two identifiers.  Location: Patient: Home Provider: Home office  I discussed the limitations of evaluation and management by telemedicine and the availability of in person appointments. The patient expressed understanding and agreed to proceed.  I provided 30 minutes of non-face-to-face time during this encounter.  04/28/2020 10:12 AM Kimberly Adkins  MRN:  [id number]  Chief Complaint: " I'm alright, I just need my refills" HPI: Patient presents today for follow-up medication management appointment.  On assessment patient states that she is doing well her mood is okay her sleep is okay her appetite is fine and at this time she has no concerns.  She admits she has some issues with logging into the day but that has been remedied and she now knows the process of logging in.  Patient states that she is out of her alprazolam and would like to have refills.  Writer educates patient on the possibility of benzodiazepine addiction.  Writer educates patient on switching from alprazolam to clonazepam as it is a safer longer lasting option.  Patient agrees to being switched from alprazolam to clonazepam.  Writer prescribed 2 weeks of alprazolam and prescribes a month of clonazepam not to be issued until July 30.  Patient is coherent, understands medication changes and has been compliant with other medication as prescribed.  She denies SI, HI, and AVH.  She agrees to follow-up in 12 weeks.  No further concerns at this time. Visit Diagnosis:    ICD-10-CM   1. Moderate episode of recurrent major depressive disorder (HCC)  F33.1 FLUoxetine (PROZAC) 20 MG capsule  2. Anxiety state  F41.1 ALPRAZolam (XANAX) 1 MG tablet    clonazePAM (KLONOPIN) 1 MG tablet  3. Benzodiazepine dependence (HCC)  F13.20     Past  Psychiatric History: MDD, PTSD, GAD  Past Medical History:  Past Medical History:  Diagnosis Date  . Anxiety   . Depression   . GERD (gastroesophageal reflux disease)   . Hyperlipidemia   . Hypertension   . Osteoarthritis   . PTSD (post-traumatic stress disorder) 2010    Past Surgical History:  Procedure Laterality Date  . ANTERIOR AND POSTERIOR REPAIR  2007  . BREAST BIOPSY  1997   right; benign  . TUBAL LIGATION  1994    Family Psychiatric History: unknown  Family History:  Family History  Problem Relation Age of Onset  . Breast cancer Paternal Grandmother        Age 18  . Colon polyps Paternal Grandmother 52  . Colon polyps Maternal Grandfather 60  . Cancer Mother        Breast cancer  . Breast cancer Mother        Age 70  . Coronary artery disease Father   . Hypertension Father   . Breast cancer Paternal Aunt        Age 55's  . Cancer Maternal Grandmother        colon  . Heart attack Daughter     Social History:  Social History   Socioeconomic History  . Marital status: Widowed    Spouse name: Not on file  . Number of children: Not on file  . Years of education: Not on file  . Highest education level: Not on file  Occupational History  . Occupation: unemplyed  Tobacco  Use  . Smoking status: Current Every Day Smoker    Packs/day: 1.50    Years: 22.00    Pack years: 33.00    Types: Cigarettes  . Smokeless tobacco: Never Used  Vaping Use  . Vaping Use: Never used  Substance and Sexual Activity  . Alcohol use: Yes  . Drug use: No  . Sexual activity: Never    Birth control/protection: Post-menopausal  Other Topics Concern  . Not on file  Social History Narrative  . Not on file   Social Determinants of Health   Financial Resource Strain:   . Difficulty of Paying Living Expenses:   Food Insecurity:   . Worried About Programme researcher, broadcasting/film/video in the Last Year:   . Barista in the Last Year:   Transportation Needs:   . Automotive engineer (Medical):   Marland Kitchen Lack of Transportation (Non-Medical):   Physical Activity:   . Days of Exercise per Week:   . Minutes of Exercise per Session:   Stress:   . Feeling of Stress :   Social Connections:   . Frequency of Communication with Friends and Family:   . Frequency of Social Gatherings with Friends and Family:   . Attends Religious Services:   . Active Member of Clubs or Organizations:   . Attends Banker Meetings:   Marland Kitchen Marital Status:     Allergies:  Allergies  Allergen Reactions  . Hydromorphone Itching    Metabolic Disorder Labs: Lab Results  Component Value Date   HGBA1C 5.8 (H) 02/07/2020   No results found for: PROLACTIN Lab Results  Component Value Date   CHOL 286 (H) 02/07/2020   TRIG 360 (H) 02/07/2020   HDL 33 (L) 02/07/2020   CHOLHDL 8.7 (H) 02/07/2020   VLDL 39.2 04/08/2013   LDLCALC 182 (H) 02/07/2020   Lab Results  Component Value Date   TSH 1.17 04/08/2013   TSH 1.69 01/22/2012    Therapeutic Level Labs: No results found for: LITHIUM No results found for: VALPROATE No components found for:  CBMZ  Current Medications: Current Outpatient Medications  Medication Sig Dispense Refill  . acetaminophen (TYLENOL) 500 MG tablet Take 1,000 mg by mouth every 4 (four) hours as needed for mild pain.    Marland Kitchen albuterol (VENTOLIN HFA) 108 (90 Base) MCG/ACT inhaler Inhale 2 puffs into the lungs every 6 (six) hours as needed for wheezing or shortness of breath. 18 g 1  . ALPRAZolam (XANAX) 1 MG tablet Take 1 tablet (1 mg total) by mouth 3 (three) times daily for 15 days. 45 tablet 0  . atorvastatin (LIPITOR) 40 MG tablet Take 1 tablet (40 mg total) by mouth daily. 90 tablet 3  . clonazePAM (KLONOPIN) 1 MG tablet Take 1 tablet (1 mg total) by mouth 3 (three) times daily as needed for anxiety. Dont fill until July 30 90 tablet 2  . diphenhydrAMINE (BENADRYL) 25 mg capsule Take 25 mg by mouth at bedtime.    Marland Kitchen FLUoxetine (PROZAC) 20 MG  capsule Take 3 capsules (60 mg total) by mouth daily. 90 capsule 2  . Fluticasone-Salmeterol (ADVAIR DISKUS) 100-50 MCG/DOSE AEPB Inhale 1 puff into the lungs 2 (two) times daily. 60 each 6  . ibuprofen (ADVIL,MOTRIN) 200 MG tablet Take 400 mg by mouth every 4 (four) hours as needed for headache or mild pain.    Marland Kitchen losartan (COZAAR) 100 MG tablet Take 1 tablet (100 mg total) by mouth daily. 90 tablet 1  .  meloxicam (MOBIC) 7.5 MG tablet Take 1 tablet (7.5 mg total) by mouth daily. 30 tablet 1   No current facility-administered medications for this visit.     Musculoskeletal: Strength & Muscle Tone: within normal limits Gait & Station: normal Patient leans: N/A  Psychiatric Specialty Exam: Review of Systems  There were no vitals taken for this visit.There is no height or weight on file to calculate BMI.  General Appearance: Casual  Eye Contact:  Good  Speech:  Clear and Coherent  Volume:  Normal  Mood:  Euthymic  Affect:  Appropriate  Thought Process:  Coherent and Descriptions of Associations: Intact  Orientation:  Full (Time, Place, and Person)  Thought Content: WDL and Logical   Suicidal Thoughts:  No  Homicidal Thoughts:  No  Memory:  Immediate;   Good  Judgement:  Fair  Insight:  Fair  Psychomotor Activity:  Normal  Concentration:  Concentration: Good  Recall:  Fair  Fund of Knowledge: Fair  Language: Good  Akathisia:  NA  Handed:  Right  AIMS (if indicated): not done  Assets:  Communication Skills Desire for Improvement Housing Leisure Time Social Support  ADL's:  Intact  Cognition: WNL  Sleep:  Fair   Screenings:   Assessment and Plan: Patient has agreed to be weaned off of alprazolam  and started on clonazepam beginning July 30.  Patient will continue taking Prozac as prescribed patient has agreed to follow-up in 12 weeks.       1. Moderate episode of recurrent major depressive disorder (HCC)  F33.1 FLUoxetine (PROZAC) 20 MG capsule  2. Anxiety state   F41.1 ALPRAZolam (XANAX) 1 MG tablet    clonazePAM (KLONOPIN) 1 MG tablet  3. Benzodiazepine dependence (HCC)  F13.20    Jearld Lesch, NP 04/28/2020, 10:12 AM

## 2020-04-28 NOTE — Progress Notes (Signed)
Assessment & Plan:  [name] was seen today for blood pressure check.  Diagnoses and all orders for this visit:  Essential hypertension -     losartan (COZAAR) 100 MG tablet; Take 1 tablet (100 mg total) by mouth daily. Patient has transportation issues. Needs to refill early -     CMP14+EGFR -     amLODipine (NORVASC) 5 MG tablet; Take 1 tablet (5 mg total) by mouth daily. Continue all antihypertensives as prescribed.  Remember to bring in your blood pressure log with you for your follow up appointment.  DASH/Mediterranean Diets are healthier choices for HTN.    Prediabetes -     Glucose (CBG)    Patient has been counseled on age-appropriate routine health concerns for screening and prevention. These are reviewed and up-to-date. Referrals have been placed accordingly. Immunizations are up-to-date or declined.    Subjective:   Chief Complaint  Patient presents with  . Blood Pressure Check    Pt. is here for blood pressure check.    HPI Kimberly Adkins 64 y.o. female presents to office today for follow up.  States she is very stressed out. Has been thinking about her dog that died last year as well as her deceased daughter and husband.    Essential Hypertension Not well controlled. Will restart amlodipine 69m daily. 10 mg of amlodipine caused BLE edema. She did well in the past on 5 mg. If blood pressure continues elevated may need to add a thiazide (chlorthalidone). She will continue on losartan 100 mg daily. Denies chest pain, shortness of breath, palpitations, lightheadedness, dizziness, headaches or BLE edema.  BP Readings from Last 3 Encounters:  04/28/20 (!) 178/99  03/22/20 (!) 141/87  03/08/20 139/75   Prediabetes Currently diet controlled. Needs to work on weight modifications and exercise daily/walking.  Lab Results  Component Value Date   HGBA1C 5.8 (H) 02/07/2020    Review of Systems  Constitutional: Negative for fever, malaise/fatigue and weight loss.    HENT: Negative.  Negative for nosebleeds.   Eyes: Negative.  Negative for blurred vision, double vision and photophobia.  Respiratory: Negative.  Negative for cough and shortness of breath.   Cardiovascular: Negative.  Negative for chest pain, palpitations and leg swelling.  Gastrointestinal: Negative.  Negative for heartburn, nausea and vomiting.  Musculoskeletal: Positive for back pain and joint pain. Negative for myalgias.  Neurological: Negative.  Negative for dizziness, focal weakness, seizures and headaches.  Psychiatric/Behavioral: Negative.  Negative for suicidal ideas.    Past Medical History:  Diagnosis Date  . Anxiety   . Depression   . GERD (gastroesophageal reflux disease)   . Hyperlipidemia   . Hypertension   . Osteoarthritis   . PTSD (post-traumatic stress disorder) 2010    Past Surgical History:  Procedure Laterality Date  . ANTERIOR AND POSTERIOR REPAIR  2007  . BREAST BIOPSY  1997   right; benign  . TUBAL LIGATION  1994    Family History  Problem Relation Age of Onset  . Breast cancer Paternal Grandmother        Age 43184 . Colon polyps Paternal Grandmother 874 . Colon polyps Maternal Grandfather 619 . Cancer Mother        Breast cancer  . Breast cancer Mother        Age 62 . Coronary artery disease Father   . Hypertension Father   . Breast cancer Paternal Aunt        Age 62's .  Cancer Maternal Grandmother        colon  . Heart attack Daughter     Social History Reviewed with no changes to be made today.   Outpatient Medications Prior to Visit  Medication Sig Dispense Refill  . acetaminophen (TYLENOL) 500 MG tablet Take 1,000 mg by mouth every 4 (four) hours as needed for mild pain.    Marland Kitchen albuterol (VENTOLIN HFA) 108 (90 Base) MCG/ACT inhaler Inhale 2 puffs into the lungs every 6 (six) hours as needed for wheezing or shortness of breath. 18 g 1  . ALPRAZolam (XANAX) 1 MG tablet Take 1 tablet (1 mg total) by mouth 3 (three) times daily for 15  days. 45 tablet 0  . atorvastatin (LIPITOR) 40 MG tablet Take 1 tablet (40 mg total) by mouth daily. 90 tablet 3  . clonazePAM (KLONOPIN) 1 MG tablet Take 1 tablet (1 mg total) by mouth 3 (three) times daily as needed for anxiety. Dont fill until July 30 90 tablet 2  . diphenhydrAMINE (BENADRYL) 25 mg capsule Take 25 mg by mouth at bedtime.    Marland Kitchen FLUoxetine (PROZAC) 20 MG capsule Take 3 capsules (60 mg total) by mouth daily. 90 capsule 2  . ibuprofen (ADVIL,MOTRIN) 200 MG tablet Take 400 mg by mouth every 4 (four) hours as needed for headache or mild pain.    . meloxicam (MOBIC) 7.5 MG tablet Take 1 tablet (7.5 mg total) by mouth daily. 30 tablet 1  . losartan (COZAAR) 100 MG tablet Take 1 tablet (100 mg total) by mouth daily. 90 tablet 1  . Fluticasone-Salmeterol (ADVAIR DISKUS) 100-50 MCG/DOSE AEPB Inhale 1 puff into the lungs 2 (two) times daily. 60 each 6   No facility-administered medications prior to visit.    Allergies  Allergen Reactions  . Hydromorphone Itching       Objective:    BP (!) 178/99 (BP Location: Left Arm, Patient Position: Sitting, Cuff Size: Normal)   Pulse 89   Temp 97.7 F (36.5 C) (Temporal)   Ht 5' 4.17" (1.63 m)   Wt 204 lb 3.2 oz (92.6 kg)   SpO2 96%   BMI 34.87 kg/m  Wt Readings from Last 3 Encounters:  04/28/20 204 lb 3.2 oz (92.6 kg)  03/08/20 204 lb 9.6 oz (92.8 kg)  04/28/17 144 lb (65.3 kg)    Physical Exam Vitals and nursing note reviewed.  Constitutional:      Appearance: She is well-developed.  HENT:     Head: Normocephalic and atraumatic.  Cardiovascular:     Rate and Rhythm: Normal rate and regular rhythm.     Heart sounds: Normal heart sounds. No murmur heard.  No friction rub. No gallop.   Pulmonary:     Effort: Pulmonary effort is normal. No tachypnea or respiratory distress.     Breath sounds: Normal breath sounds. No decreased breath sounds, wheezing, rhonchi or rales.  Chest:     Chest wall: No tenderness.  Abdominal:      General: Bowel sounds are normal.     Palpations: Abdomen is soft.  Musculoskeletal:        General: Normal range of motion.     Cervical back: Normal range of motion.  Skin:    General: Skin is warm and dry.  Neurological:     Mental Status: She is alert and oriented to person, place, and time.     Coordination: Coordination normal.  Psychiatric:        Behavior: Behavior normal. Behavior is  cooperative.        Thought Content: Thought content normal.        Judgment: Judgment normal.          Patient has been counseled extensively about nutrition and exercise as well as the importance of adherence with medications and regular follow-up. The patient was given clear instructions to go to ER or return to medical center if symptoms don't improve, worsen or new problems develop. The patient verbalized understanding.   Follow-up: Return in about 3 weeks (around 05/19/2020) for BP recheck with LUKE.   Gildardo Pounds, FNP-BC [facility] [place], [place]   04/29/2020, 4:38 PM

## 2020-04-29 ENCOUNTER — Encounter: Payer: Self-pay | Admitting: Nurse Practitioner

## 2020-04-29 ENCOUNTER — Other Ambulatory Visit: Payer: Self-pay | Admitting: Nurse Practitioner

## 2020-04-29 DIAGNOSIS — R7401 Elevation of levels of liver transaminase levels: Secondary | ICD-10-CM

## 2020-04-29 LAB — CMP14+EGFR
ALT: 41 IU/L — ABNORMAL HIGH (ref 0–32)
AST: 62 IU/L — ABNORMAL HIGH (ref 0–40)
Albumin/Globulin Ratio: 1.9 (ref 1.2–2.2)
Albumin: 4.5 g/dL (ref 3.8–4.8)
Alkaline Phosphatase: 111 IU/L (ref 48–121)
BUN/Creatinine Ratio: 7 — ABNORMAL LOW (ref 12–28)
BUN: 5 mg/dL — ABNORMAL LOW (ref 8–27)
Bilirubin Total: 0.4 mg/dL (ref 0.0–1.2)
CO2: 25 mmol/L (ref 20–29)
Calcium: 9.3 mg/dL (ref 8.7–10.3)
Chloride: 98 mmol/L (ref 96–106)
Creatinine, Ser: 0.76 mg/dL (ref 0.57–1.00)
GFR calc Af Amer: 97 mL/min/{1.73_m2} (ref >59)
GFR calc non Af Amer: 84 mL/min/{1.73_m2} (ref >59)
Globulin, Total: 2.4 g/dL (ref 1.5–4.5)
Glucose: 100 mg/dL — ABNORMAL HIGH (ref 65–99)
Potassium: 3.8 mmol/L (ref 3.5–5.2)
Sodium: 137 mmol/L (ref 134–144)
Total Protein: 6.9 g/dL (ref 6.0–8.5)

## 2020-05-02 NOTE — Progress Notes (Signed)
Spoke to patient and informed on results. Pt. Understood.  

## 2020-05-02 NOTE — Telephone Encounter (Signed)
Patient would like a call back with the results of her lab work Ph# [phone]

## 2020-05-04 ENCOUNTER — Ambulatory Visit (HOSPITAL_COMMUNITY): Payer: No Payment, Other | Admitting: Psychiatry

## 2020-05-19 ENCOUNTER — Ambulatory Visit: Payer: Self-pay | Attending: Nurse Practitioner | Admitting: Pharmacist

## 2020-05-19 ENCOUNTER — Other Ambulatory Visit: Payer: Self-pay

## 2020-05-19 ENCOUNTER — Encounter: Payer: Self-pay | Admitting: Pharmacist

## 2020-05-19 VITALS — BP 152/93 | HR 91

## 2020-05-19 DIAGNOSIS — I10 Essential (primary) hypertension: Secondary | ICD-10-CM

## 2020-05-19 MED FILL — ATORVASTATIN CALCIUM 40 MG: 40 | 30 days supply | Qty: 30 | Fill #3

## 2020-05-19 MED FILL — AMLODIPINE BESYLATE 5 MG TA: 5 | 30 days supply | Qty: 30 | Fill #1

## 2020-05-19 MED FILL — LOSARTAN POTASSIUM 100 MG T: 100 | 30 days supply | Qty: 30 | Fill #3

## 2020-05-23 NOTE — Progress Notes (Signed)
S:    Patient arrives in no acute distress. Presents to the clinic for hypertension evaluation, counseling, and management. Patient was referred and last seen by Primary Care Provider on 04/28/20. She was restarted on amlodipine.  Current BP Medications include:   1. Amlodipine 5 mg daily (never started) 2. Losartan 100 mg daily (not taking, lost supply)  Adverse effects: denies dizziness/lightheadedness, headaches, chest pain, shortness of breath, dry cough  Adherence: moved 3 weeks ago, lost medications. Restarted old supply of losartan/HCTZ 50/12.5 daily since then.  Antihypertensives tried in the past include:  Amlodipine (10 mg causes BLE edema) HCTZ (stopped d/t low sodium)  Diet: drinking more water, eats mainly fast food/take out, denies frozen meals             - Sodium: adds a minimal amt, willing to stop, goal < 1500 mg/day             - Caffeine: 2-3 16 oz diet mt dew per day (72 mg per 16 oz) Exercise: none, interested in starting to walk with daugther Tobacco use: 1 ppd (50 years history, baseline 2-3 ppd) Alcohol: occasional wine cooler Stress: moved and stress has resolved Sleep: denies concerns NSAID use: ibuprofen as needed  O:  Home BP readings: does not have cuff  BP Today: 152/93  Clinical ASCVD:  The 10-year ASCVD risk score Denman George DC Jr., et al., 2013) is: 31.5%   A: - Clinic BP not at goal of < 130/80 - Not adherent to current medications d/t lost supply - Lifestyle factors not optimized, discussed limiting sodium, encouraged exercise, manage stress, avoid/limit NSAID use. Would benefit from further discussion on DASH diet, caffeine reduction. - Pt not interested in cutting back or quitting smoking at this time. Will continue to monitor readiness to quit. - Would benefit from further management of ASCVD risk factors at future visits given high 10 year risk  P: - Stop losartan/HCTZ - Restart losartan 100 mg daily and start amlodipine 5 mg daily -  Limit sodium intake, start walking with daughter for exercise - F/u in 3 weeks with Clinical Pharmacist for HTN mgmt   Laverna Peace, PharmD PGY-1 Ambulatory Care Pharmacy Resident 05/19/20 4:00 PM  Butch Penny, PharmD, CPP Clinical Pharmacist [facility] [phone]

## 2020-06-08 NOTE — Progress Notes (Unsigned)
   S:    PCP: Fleming  Patient arrives in no acute distress. Presents to the clinic for hypertension evaluation, counseling, and management. Patient was referred and last seen by Primary Care Provider on7/16/21.She was restarted on amlodipine.  Patient was last seen by Clinical Pharmacist on 05/19/20. At that visit, patient had been taking old supply of losartan/HCTZ due to losing HTN medications after unexpected move. HTN medications were restarted, including new amlodipine.  Current BP Medications include: 1. Amlodipine 5 mg daily 2. Losartan 100 mg daily   Adverse effects: Adherence:  Antihypertensives tried in the past include:  Amlodipine (10 mg causes BLE edema) HCTZ (stopped d/t low sodium)  Diet:drinking more water, eats mainly fast food/take out, denies frozen meals - Sodium: adds a minimal amt, willing to stop,goal <1500 mg/day - Caffeine: 2-3 16 oz diet mt dew per day (72 mg per 16 oz) Exercise:none, interested in starting to walk with daugther Tobacco use:1 ppd (50 years history, baseline 2-3 ppd) Alcohol:occasional wine cooler Stress:moved and stress has resolved Sleep:denies concerns NSAID use: ibuprofen as needed  Prior to visit: - BP meds taken today: {YES/NO:21197} - Caffeine use: {YES/NO:21197} - Tobacco use: {YES/NO:21197} - Exercise: {YES/NO:21197}  O:  Home BP readings: ***  There were no vitals filed for this visit.  BMET    Component Value Date/Time   NA 137 04/28/2020 1520   K 3.8 04/28/2020 1520   CL 98 04/28/2020 1520   CO2 25 04/28/2020 1520   GLUCOSE 100 (H) 04/28/2020 1520   GLUCOSE 101 (H) 04/08/2013 0954   BUN 5 (L) 04/28/2020 1520   CREATININE 0.76 04/28/2020 1520   CALCIUM 9.3 04/28/2020 1520   GFRNONAA 84 04/28/2020 1520   GFRAA 97 04/28/2020 1520    Renal function: CrCl cannot be calculated (Patient's most recent lab result is older than the maximum 21 days allowed.).  Clinical ASCVD:  The  10-year ASCVD risk score Denman George DC Jr., et al., 2013) is: 24.1%   Values used to calculate the score:     Age: 64 years     Sex: Female     Is Non-Hispanic African American: No     Diabetic: No     Tobacco smoker: Yes     Systolic Blood Pressure: 152 mmHg     Is BP treated: Yes     HDL Cholesterol: 33 mg/dL     Total Cholesterol: 286 mg/dL   A: - Clinic BP not at goal of < *** - Adherent to current medications, tolerating well  P: - Continue/start/stop*** - F/u labs ordered: *** - Lifestyle ***  Total time in face-to-face counseling *** minutes.   F/U Pharmacist Clinic Visit in ***.  Patient seen with ***

## 2020-06-09 ENCOUNTER — Ambulatory Visit: Payer: Self-pay | Admitting: Pharmacist

## 2020-06-16 ENCOUNTER — Ambulatory Visit: Payer: Self-pay | Attending: Family Medicine | Admitting: Pharmacist

## 2020-06-16 ENCOUNTER — Other Ambulatory Visit: Payer: Self-pay

## 2020-06-16 ENCOUNTER — Encounter: Payer: Self-pay | Admitting: Pharmacist

## 2020-06-16 VITALS — BP 155/95 | HR 84

## 2020-06-16 DIAGNOSIS — I10 Essential (primary) hypertension: Secondary | ICD-10-CM

## 2020-06-16 DIAGNOSIS — R7401 Elevation of levels of liver transaminase levels: Secondary | ICD-10-CM

## 2020-06-16 MED ORDER — CARVEDILOL 3.125 MG PO TABS: 3.1250 mg | ORAL_TABLET | Freq: Two times a day (BID) | ORAL | 0 refills | Status: DC

## 2020-06-16 MED FILL — CARVEDILOL 3.125 MG TABLET: 3.125 | 30 days supply | Qty: 60 | Fill #0

## 2020-06-16 MED FILL — AMLODIPINE BESYLATE 5 MG TA: 5 | 30 days supply | Qty: 30 | Fill #2

## 2020-06-16 MED FILL — ATORVASTATIN CALCIUM 40 MG: 40 | 30 days supply | Qty: 30 | Fill #4

## 2020-06-16 MED FILL — LOSARTAN POTASSIUM 100 MG T: 100 | 30 days supply | Qty: 30 | Fill #4

## 2020-06-16 NOTE — Progress Notes (Addendum)
   S:    PCP: Kimberly Adkins   Patient arrives in good spirits. Presents to the clinic for hypertension evaluation, counseling, and management. Patient was referred and last seen by Primary Care Provider on 04/28/2020. I saw her on 05/19/2020 and made no changes as she wasn't adherent to her medications.   Medication adherence reported.   Current BP Medications include:  Amlodipine 5 mg daily, losartan 100 mg daily.   Antihypertensives tried in the past include: HCTZ (hyponatremia), amlodipine (LE edema)  Dietary habits include: limits salt; admits to caffeine consumption  Exercise habits include: none  Family / Social history:  - CAD, MI - Tobacco: current every day smoker  - Alcohol: reports use   O:  Vitals:   06/16/20 1515  BP: (!) 155/95   Home BP: none   Last 3 Office BP readings: BP Readings from Last 3 Encounters:  06/16/20 (!) 155/95  05/19/20 (!) 152/93  04/28/20 (!) 178/99    BMET    Component Value Date/Time   NA 137 04/28/2020 1520   K 3.8 04/28/2020 1520   CL 98 04/28/2020 1520   CO2 25 04/28/2020 1520   GLUCOSE 100 (H) 04/28/2020 1520   GLUCOSE 101 (H) 04/08/2013 0954   BUN 5 (L) 04/28/2020 1520   CREATININE 0.76 04/28/2020 1520   CALCIUM 9.3 04/28/2020 1520   GFRNONAA 84 04/28/2020 1520   GFRAA 97 04/28/2020 1520    Renal function: CrCl cannot be calculated (Patient's most recent lab result is older than the maximum 21 days allowed.).  Clinical ASCVD: No  The 10-year ASCVD risk score Denman George DC Jr., et al., 2013) is: 24.9%   Values used to calculate the score:     Age: 5 years     Sex: Female     Is Non-Hispanic African American: No     Diabetic: No     Tobacco smoker: Yes     Systolic Blood Pressure: 155 mmHg     Is BP treated: Yes     HDL Cholesterol: 33 mg/dL     Total Cholesterol: 286 mg/dL   PHQ: 13  A/P: Hypertension longstanding currently above goal on current medications. BP Goal = < 130/80 mmHg. Medication adherence reported. Pt cannot  tolerate amlodipine and has a hx of hyponatremia on HCTZ. Will add low-dose carvedilol and see patient in 1 month. -Start carvedilol 3.125 mg BID -Continued amlodipine, losartan.  -Counseled on lifestyle modifications for blood pressure control including reduced dietary sodium, increased exercise, adequate sleep.  Results reviewed and written information provided.   Total time in face-to-face counseling 15 minutes.   F/U Clinic Visit in 1 month.   Butch Penny, PharmD, CPP Clinical Pharmacist [facility] [phone]

## 2020-06-17 LAB — HEPATIC FUNCTION PANEL
ALT: 27 IU/L (ref 0–32)
AST: 31 IU/L (ref 0–40)
Albumin: 4.2 g/dL (ref 3.8–4.8)
Alkaline Phosphatase: 101 IU/L (ref 48–121)
Bilirubin Total: 0.3 mg/dL (ref 0.0–1.2)
Bilirubin, Direct: 0.1 mg/dL (ref 0.00–0.40)
Total Protein: 6.6 g/dL (ref 6.0–8.5)

## 2020-06-23 ENCOUNTER — Telehealth (HOSPITAL_COMMUNITY): Payer: Self-pay

## 2020-07-07 ENCOUNTER — Ambulatory Visit: Payer: Self-pay | Admitting: Pharmacist

## 2020-07-07 ENCOUNTER — Other Ambulatory Visit: Payer: Self-pay | Admitting: Family Medicine

## 2020-07-07 DIAGNOSIS — I10 Essential (primary) hypertension: Secondary | ICD-10-CM

## 2020-07-11 MED FILL — AMLODIPINE BESYLATE 5 MG TA: 5 | 30 days supply | Qty: 30 | Fill #3

## 2020-07-11 MED FILL — ATORVASTATIN CALCIUM 40 MG: 40 | 30 days supply | Qty: 30 | Fill #5

## 2020-07-11 MED FILL — LOSARTAN POTASSIUM 100 MG T: 100 | 30 days supply | Qty: 30 | Fill #5

## 2020-07-11 NOTE — Progress Notes (Signed)
   S:    PCP: Zelda   Patient arrives in good spirits. Presents to the clinic for hypertension evaluation, counseling, and management. Patient was referred and last seen by Primary Care Provider on 04/28/2020. Last seen by pharmacy clinic 06/16/20 at which time BP was still elevated despite patient reported adherence. We added low dose carvedilol.  Today, patient reports she has not slept all night and feels nervous about her appointment. She is stressed at home as well. Medication adherence is reported. States she has missed only two doses of the second dose of carvedilol since her last visit. Denies adverse effects with new carvedilol therapy.   Current BP Medications include:  Amlodipine 5 mg daily, losartan 100 mg daily, carvedilol 3.125 mg BID.   Antihypertensives tried in the past include: HCTZ (hyponatremia), amlodipine (LE edema)  Dietary habits include: limits salt; admits to caffeine consumption  Exercise habits include: none  Family / Social history:  - CAD, MI - Tobacco: current every day smoker  - Alcohol: reports use   O:  Vitals:   07/12/20 1444  BP: 129/82  Pulse: 69   Home BP: none - still has not purchased BP cuff  Last 3 Office BP readings: BP Readings from Last 3 Encounters:  07/12/20 129/82  06/16/20 (!) 155/95  05/19/20 (!) 152/93   BMET    Component Value Date/Time   NA 137 04/28/2020 1520   K 3.8 04/28/2020 1520   CL 98 04/28/2020 1520   CO2 25 04/28/2020 1520   GLUCOSE 100 (H) 04/28/2020 1520   GLUCOSE 101 (H) 04/08/2013 0954   BUN 5 (L) 04/28/2020 1520   CREATININE 0.76 04/28/2020 1520   CALCIUM 9.3 04/28/2020 1520   GFRNONAA 84 04/28/2020 1520   GFRAA 97 04/28/2020 1520   Renal function: CrCl cannot be calculated (Patient's most recent lab result is older than the maximum 21 days allowed.).  Clinical ASCVD: No  The 10-year ASCVD risk score Denman George DC Jr., et al., 2013) is: 17.9%   Values used to calculate the score:     Age: 64 years      Sex: Female     Is Non-Hispanic African American: No     Diabetic: No     Tobacco smoker: Yes     Systolic Blood Pressure: 129 mmHg     Is BP treated: Yes     HDL Cholesterol: 33 mg/dL     Total Cholesterol: 286 mg/dL   A/P: Hypertension longstanding now at goal on current medications. BP Goal = < 130/80 mmHg. Medication adherence reported. BP has significantly improved since addition of low dose carvedilol and encouraging medication adherence.   -Continue carvedilol 3.125 mg BID - refills provided -Continued amlodipine, losartan -Counseled on lifestyle modifications for blood pressure control including reduced dietary sodium, increased exercise, adequate sleep.   Results reviewed and written information provided. Total time in face-to-face counseling 15 minutes.   F/U PCP Visit in 2-3 months.   Pervis Hocking, PharmD PGY1 Pharmacy Resident 07/12/2020 3:58 PM

## 2020-07-12 ENCOUNTER — Other Ambulatory Visit: Payer: Self-pay | Admitting: Family Medicine

## 2020-07-12 ENCOUNTER — Ambulatory Visit: Payer: Self-pay | Attending: Nurse Practitioner | Admitting: Pharmacist

## 2020-07-12 ENCOUNTER — Other Ambulatory Visit: Payer: Self-pay

## 2020-07-12 ENCOUNTER — Encounter: Payer: Self-pay | Admitting: Pharmacist

## 2020-07-12 DIAGNOSIS — I10 Essential (primary) hypertension: Secondary | ICD-10-CM

## 2020-07-12 MED ORDER — CARVEDILOL 3.125 MG PO TABS: 3.1250 mg | ORAL_TABLET | Freq: Two times a day (BID) | ORAL | 2 refills | Status: DC

## 2020-07-12 MED FILL — CARVEDILOL 3.125 MG TABLET: 3.125 | 30 days supply | Qty: 60 | Fill #0

## 2020-07-26 ENCOUNTER — Other Ambulatory Visit: Payer: Self-pay

## 2020-07-26 ENCOUNTER — Encounter (HOSPITAL_COMMUNITY): Payer: Self-pay | Admitting: Physician Assistant

## 2020-07-26 ENCOUNTER — Telehealth (INDEPENDENT_AMBULATORY_CARE_PROVIDER_SITE_OTHER): Payer: No Payment, Other | Admitting: Physician Assistant

## 2020-07-26 ENCOUNTER — Telehealth (HOSPITAL_COMMUNITY): Payer: Self-pay | Admitting: Psychiatry

## 2020-07-26 DIAGNOSIS — F411 Generalized anxiety disorder: Secondary | ICD-10-CM

## 2020-07-26 DIAGNOSIS — F331 Major depressive disorder, recurrent, moderate: Secondary | ICD-10-CM | POA: Diagnosis not present

## 2020-07-26 MED ORDER — ALPRAZOLAM 1 MG PO TABS
1.0000 mg | ORAL_TABLET | Freq: Three times a day (TID) | ORAL | 0 refills | Status: DC | PRN
Start: 2020-07-26 — End: 2020-08-29

## 2020-07-26 NOTE — Progress Notes (Addendum)
BH MD/PA/NP OP Progress Note  Virtual Visit via Telephone Note  I connected with Kimberly Adkins on 07/26/2020 at  4:00 PM EDT by telephone and verified that I am speaking with the correct person using two identifiers.  Location: Patient: Home Provider: Clinic   I discussed the limitations, risks, security and privacy concerns of performing an evaluation and management service by telephone and the availability of in person appointments. I also discussed with the patient that there may be a patient responsible charge related to this service. The patient expressed understanding and agreed to proceed.  Follow Up Instructions:  I discussed the assessment and treatment plan with the patient. The patient was provided an opportunity to ask questions and all were answered. The patient agreed with the plan and demonstrated an understanding of the instructions.   The patient was advised to call back or seek an in-person evaluation if the symptoms worsen or if the condition fails to improve as anticipated.  I provided 40 minutes of non-face-to-face time during this encounter.   Meta HatchetUchenna E Kenyona Rena, PA   07/26/2020 4:40 PM Kimberly Adkins  MRN:  [id number]  Chief Complaint:  Follow-up/medication management  HPI: Kimberly Adkins is a 64 year old female with a past psych history significant for obsessive compulsive disorder, PTSD, and MDD. Patient is being seen for follow-up/medication management via telemedicine. Patient states that [facility] after [facility]. [facility] at [facility], patient was seen by Mauricia Areaimothy Hudson for her behavioral health needs. Patient was placed on Xanax by Mauricia Areaimothy Hudson and reported successful management of her anxiety while on Xanax.   After [facility], [facility] by Jearld Leschashaun M Dixon, NP. Patient was switched from alprazolam  to clonazepam since clonazepam is a safer, longer lasting option for the management of anxiety. Patient reports not being able to sleep for the past 2 months since being put on clonazepam. Patient states that she often experiences panic attacks when waking up in the middle of the night. Patient states that she never had issues with anxiety or panic attacks whenever she was taking Xanax 1 mg three times daily PRN and wishes to be placed on that same regimen again.  Patient reports feelings of depression but is able to manage. Patient states that her depressed mood stems from the combination of the loss of her husband, daughter, and pet dog over several years. Patient denies suicide or homicide ideation.  Patient denies auditory or visual hallucinations. Patient states that being around her grand children is what keeps her going. Patient is currently living with her daughter for a couple of months then going back to live with her siste, which is her permanent place of residence.  Visit Diagnosis:    ICD-10-CM   1. Anxiety state  F41.1     Past Psychiatric History: Obsessive-Compulsive Disorder PTSD MDD   Past Medical History:  Past Medical History:  Diagnosis Date  . Anxiety   . Depression   . GERD (gastroesophageal reflux disease)   . Hyperlipidemia   . Hypertension   . Osteoarthritis   . PTSD (post-traumatic stress disorder) 2010    Past Surgical History:  Procedure Laterality Date  . ANTERIOR AND POSTERIOR REPAIR  2007  . BREAST BIOPSY  1997   right; benign  . TUBAL LIGATION  1994    Family Psychiatric History: unknown  Family History:  Family History  Problem Relation Age of Onset  . Breast cancer Paternal Grandmother        Age 59  . Colon polyps Paternal Grandmother 23  . Colon polyps Maternal Grandfather 60  . Cancer Mother        Breast cancer  . Breast cancer Mother        Age 12  . Coronary artery disease Father   . Hypertension Father   . Breast cancer  Paternal Aunt        Age 40's  . Cancer Maternal Grandmother        colon  . Heart attack Daughter     Social History:  Social History   Socioeconomic History  . Marital status: Widowed    Spouse name: Not on file  . Number of children: Not on file  . Years of education: Not on file  . Highest education level: Not on file  Occupational History  . Occupation: unemplyed  Tobacco Use  . Smoking status: Current Every Day Smoker    Packs/day: 1.50    Years: 22.00    Pack years: 33.00    Types: Cigarettes  . Smokeless tobacco: Never Used  Vaping Use  . Vaping Use: Never used  Substance and Sexual Activity  . Alcohol use: Yes  . Drug use: No  . Sexual activity: Never    Birth control/protection: Post-menopausal  Other Topics Concern  . Not on file  Social History Narrative  . Not on file   Social Determinants of Health   Financial Resource Strain:   . Difficulty of Paying Living Expenses: Not on file  Food Insecurity:   . Worried About Programme researcher, broadcasting/film/video in the Last Year: Not on file  . Ran Out of Food in the Last Year: Not on file  Transportation Needs:   . Lack of Transportation (Medical): Not on file  . Lack of Transportation (Non-Medical): Not on file  Physical Activity:   . Days of Exercise per Week: Not on file  . Minutes of Exercise per Session: Not on file  Stress:   . Feeling of Stress : Not on file  Social Connections:   . Frequency of Communication with Friends and Family: Not on file  . Frequency of Social Gatherings with Friends and Family: Not on file  . Attends Religious Services: Not on file  . Active Member of Clubs or Organizations: Not on file  . Attends Banker Meetings: Not on file  . Marital Status: Not on file    Allergies:  Allergies  Allergen Reactions  . Hydromorphone Itching    Metabolic Disorder Labs: Lab Results  Component Value Date   HGBA1C 5.8 (H) 02/07/2020   No results found for: PROLACTIN Lab Results   Component Value Date   CHOL 286 (H) 02/07/2020   TRIG 360 (H) 02/07/2020   HDL 33 (L) 02/07/2020   CHOLHDL 8.7 (H) 02/07/2020   VLDL 39.2 04/08/2013   LDLCALC 182 (H) 02/07/2020   Lab Results  Component Value Date   TSH 1.17 04/08/2013   TSH 1.69 01/22/2012    Therapeutic Level Labs: No results found for: LITHIUM No results found for: VALPROATE No components found for:  CBMZ  Current Medications: Current Outpatient Medications  Medication Sig Dispense Refill  . acetaminophen (TYLENOL) 500 MG tablet Take 1,000 mg by mouth every 4 (four) hours as needed for mild pain.    Marland Kitchen albuterol (VENTOLIN HFA) 108 (90 Base) MCG/ACT inhaler Inhale 2 puffs into  the lungs every 6 (six) hours as needed for wheezing or shortness of breath. 18 g 1  . ALPRAZolam (XANAX) 1 MG tablet Take 1 tablet (1 mg total) by mouth 3 (three) times daily for 15 days. 45 tablet 0  . ALPRAZolam (XANAX) 1 MG tablet Take 1 tablet (1 mg total) by mouth 3 (three) times daily as needed for anxiety. 90 tablet 0  . amLODipine (NORVASC) 5 MG tablet Take 1 tablet (5 mg total) by mouth daily. 90 tablet 3  . atorvastatin (LIPITOR) 40 MG tablet Take 1 tablet (40 mg total) by mouth daily. 90 tablet 3  . carvedilol (COREG) 3.125 MG tablet Take 1 tablet (3.125 mg total) by mouth 2 (two) times daily with a meal. 60 tablet 2  . clonazePAM (KLONOPIN) 1 MG tablet Take 1 tablet (1 mg total) by mouth 3 (three) times daily as needed for anxiety. Dont fill until July 30 90 tablet 2  . diphenhydrAMINE (BENADRYL) 25 mg capsule Take 25 mg by mouth at bedtime.    Marland Kitchen FLUoxetine (PROZAC) 20 MG capsule Take 3 capsules (60 mg total) by mouth daily. 90 capsule 2  . Fluticasone-Salmeterol (ADVAIR DISKUS) 100-50 MCG/DOSE AEPB Inhale 1 puff into the lungs 2 (two) times daily. 60 each 6  . ibuprofen (ADVIL,MOTRIN) 200 MG tablet Take 400 mg by mouth every 4 (four) hours as needed for headache or mild pain.    Marland Kitchen losartan (COZAAR) 100 MG tablet Take 1 tablet  (100 mg total) by mouth daily. Patient has transportation issues. Needs to refill early 90 tablet 1  . meloxicam (MOBIC) 7.5 MG tablet Take 1 tablet (7.5 mg total) by mouth daily. 30 tablet 1   No current facility-administered medications for this visit.     Musculoskeletal: Strength & Muscle Tone: within normal limits Gait & Station: normal Patient leans: N/A  Psychiatric Specialty Exam: Review of Systems  Constitutional: Negative.   HENT: Negative.   Eyes: Negative.   Respiratory: Negative.   Cardiovascular: Negative.   Gastrointestinal: Negative.   Endocrine: Negative.   Musculoskeletal: Negative.   Skin: Negative.   Neurological: Negative.     There were no vitals taken for this visit.There is no height or weight on file to calculate BMI.  General Appearance: Well Groomed  Eye Contact:  Good  Speech:  Clear and Coherent and Normal Rate  Volume:  Normal  Mood:  Depressed  Affect:  Appropriate and Congruent  Thought Process:  Coherent and Goal Directed  Orientation:  Full (Time, Place, and Person)  Thought Content: Logical   Suicidal Thoughts:  No  Homicidal Thoughts:  No  Memory:  Immediate;   Good Recent;   Good Remote;   Good  Judgement:  Good  Insight:  Good  Psychomotor Activity:  Normal  Concentration:  Concentration: Good and Attention Span: Good  Recall:  Good  Fund of Knowledge: Good  Language: Good  Akathisia:  Negative  Handed:  Right  AIMS (if indicated): not done  Assets:  Communication Skills Desire for Improvement Financial Resources/Insurance Housing Resilience Social Support  ADL's:  Intact  Cognition: WNL  Sleep:  Poor   Screenings: GAD-7     Office Visit from 04/28/2020 in [facility]  Total GAD-7 Score 12    PHQ2-9     Office Visit from 04/28/2020 in [facility]  PHQ-2 Total Score 6  PHQ-9 Total Score 12       Assessment and Plan:  Patient is a 64 year old female  who presents to [facility] for follow-up/medication management. Patient reports no relief from her anxiety while on clonazepam 1 mg three times daily as needed. Patient states that she was able to manage her anxiety better while on Xanax 1 mg three times daily. Patient wishes to be placed back on her previous regimen of medication for the management of her anxiety. Patient was prescribed a month of Xanax and was advised to wean themselves off of clonazepam. Patient was educated on the risk of benzodiazepine dependence from using Xanax.  Patient will continue taking Prozac as prescribed and has agreed to follow-up in 1 month  1. Anxiety state Alprazolam (Xanax) 1 mg 3 times daily as needed for the management of anxiety  2. Moderate episode of recurrent major depressive disorder (HCC) Fluoxetine (Prozac) 20 MG, 3 times daily       Meta Hatchet, PA 07/26/2020, 4:40 PM

## 2020-07-26 NOTE — Telephone Encounter (Signed)
Rx for Xanax 1 mg TID sent on behalf of PA [facility]. [place].

## 2020-08-21 MED FILL — ATORVASTATIN CALCIUM 40 MG: 40 | 30 days supply | Qty: 30 | Fill #6

## 2020-08-21 MED FILL — AMLODIPINE BESYLATE 5 MG TA: 5 | 30 days supply | Qty: 30 | Fill #4

## 2020-08-21 MED FILL — LOSARTAN POTASSIUM 100 MG T: 100 | 30 days supply | Qty: 30 | Fill #0

## 2020-08-21 MED FILL — CARVEDILOL 3.125 MG TABLET: 3.125 | 30 days supply | Qty: 60 | Fill #1

## 2020-08-24 ENCOUNTER — Telehealth (HOSPITAL_COMMUNITY): Payer: No Payment, Other | Admitting: Physician Assistant

## 2020-08-29 ENCOUNTER — Other Ambulatory Visit: Payer: Self-pay

## 2020-08-29 ENCOUNTER — Telehealth (INDEPENDENT_AMBULATORY_CARE_PROVIDER_SITE_OTHER): Payer: No Payment, Other | Admitting: Physician Assistant

## 2020-08-29 DIAGNOSIS — F411 Generalized anxiety disorder: Secondary | ICD-10-CM | POA: Diagnosis not present

## 2020-08-29 DIAGNOSIS — F331 Major depressive disorder, recurrent, moderate: Secondary | ICD-10-CM

## 2020-08-29 MED ORDER — FLUOXETINE HCL 20 MG PO CAPS: 60.0000 mg | ORAL_CAPSULE | Freq: Every day | ORAL | 2 refills | Status: DC

## 2020-08-29 MED ORDER — ALPRAZOLAM 1 MG PO TABS: 1.0000 mg | ORAL_TABLET | Freq: Three times a day (TID) | ORAL | 0 refills | Status: DC | PRN

## 2020-09-26 MED FILL — LOSARTAN POTASSIUM 100 MG T: 100 | 30 days supply | Qty: 30 | Fill #1

## 2020-09-26 MED FILL — ATORVASTATIN CALCIUM 40 MG: 40 | 30 days supply | Qty: 30 | Fill #7

## 2020-09-26 MED FILL — CARVEDILOL 3.125 MG TABLET: 3.125 | 30 days supply | Qty: 60 | Fill #2

## 2020-09-26 MED FILL — AMLODIPINE BESYLATE 5 MG TA: 5 | 30 days supply | Qty: 30 | Fill #5

## 2020-11-09 ENCOUNTER — Encounter (HOSPITAL_COMMUNITY): Payer: Self-pay | Admitting: Physician Assistant

## 2020-11-09 NOTE — Progress Notes (Signed)
BH MD/PA/NP OP Progress Note  Virtual Visit via Telephone Note  I connected with Kimberly Adkins on 08/29/2020 at  4:30 PM EST by telephone and verified that I am speaking with the correct person using two identifiers.  Location: Patient: Home Provider: Clinic   I discussed the limitations, risks, security and privacy concerns of performing an evaluation and management service by telephone and the availability of in person appointments. I also discussed with the patient that there may be a patient responsible charge related to this service. The patient expressed understanding and agreed to proceed.  Follow Up Instructions:   I discussed the assessment and treatment plan with the patient. The patient was provided an opportunity to ask questions and all were answered. The patient agreed with the plan and demonstrated an understanding of the instructions.   The patient was advised to call back or seek an in-person evaluation if the symptoms worsen or if the condition fails to improve as anticipated.  I provided 21 minutes of non-face-to-face time during this encounter.  Meta Hatchet, PA   11/09/2020 1:05 PM Kimberly Adkins  MRN:  [id number]  Chief Complaint: Follow-up and medication management  HPI:   Kimberly Adkins is a 65 year old female with a past psychiatric history significant for OCD, PTSD, anxiety, and major depressive disorder who presents to [facility] via virtual telephone visit for follow-up and medication management.  Patient is currently being managed on the following medications:  Xanax 1 mg 3 times daily as needed for the management of anxiety Prozac 60 mg 1 time daily  Patient reports that her medications are treating her well and denies the need for dosage adjustments at this time.  Patient is requesting refills on all of her medications.  Patient has no other concerns during the encounter.  Patient endorses feeling a  little depressed due to her daughter and her daughters has been dealing with a custody battle with the husband's sister.  Patient is hopeful that the situation will get better.  Patient denies suicidal or homicidal ideations at this time.  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  She further denies auditory or visual hallucinations.  Patient endorses good sleep during the day and feels well rested upon waking.  Patient endorses good appetite and eats on average 2 meals per day.  Patient endorses consuming alcohol sparingly.  She endorses tobacco use and smokes about a pack per day.  She denies illicit drug use at this time.  Visit Diagnosis:    ICD-10-CM   1. Anxiety state  F41.1 FLUoxetine (PROZAC) 20 MG capsule    ALPRAZolam (XANAX) 1 MG tablet    ALPRAZolam (XANAX) 1 MG tablet    ALPRAZolam (XANAX) 1 MG tablet  2. Moderate episode of recurrent major depressive disorder (HCC)  F33.1 FLUoxetine (PROZAC) 20 MG capsule    Past Psychiatric History:  Obsessive-Compulsive Disorder PTSD Major depressive disorder Anxiety  Past Medical History:  Past Medical History:  Diagnosis Date  . Anxiety   . Depression   . GERD (gastroesophageal reflux disease)   . Hyperlipidemia   . Hypertension   . Osteoarthritis   . PTSD (post-traumatic stress disorder) 2010    Past Surgical History:  Procedure Laterality Date  . ANTERIOR AND POSTERIOR REPAIR  2007  . BREAST BIOPSY  1997   right; benign  . TUBAL LIGATION  1994  Family Psychiatric History: Unknown  Family History:  Family History  Problem Relation Age of Onset  . Breast cancer Paternal Grandmother        Age 55  . Colon polyps Paternal Grandmother 35  . Colon polyps Maternal Grandfather 60  . Cancer Mother        Breast cancer  . Breast cancer Mother        Age 70  . Coronary artery disease Father   . Hypertension Father   . Breast cancer Paternal Aunt         Age 5's  . Cancer Maternal Grandmother        colon  . Heart attack Daughter     Social History:  Social History   Socioeconomic History  . Marital status: Widowed    Spouse name: Not on file  . Number of children: Not on file  . Years of education: Not on file  . Highest education level: Not on file  Occupational History  . Occupation: unemplyed  Tobacco Use  . Smoking status: Current Every Day Smoker    Packs/day: 1.50    Years: 22.00    Pack years: 33.00    Types: Cigarettes  . Smokeless tobacco: Never Used  Vaping Use  . Vaping Use: Never used  Substance and Sexual Activity  . Alcohol use: Yes  . Drug use: No  . Sexual activity: Never    Birth control/protection: Post-menopausal  Other Topics Concern  . Not on file  Social History Narrative  . Not on file   Social Determinants of Health   Financial Resource Strain: Not on file  Food Insecurity: Not on file  Transportation Needs: Not on file  Physical Activity: Not on file  Stress: Not on file  Social Connections: Not on file    Allergies:  Allergies  Allergen Reactions  . Hydromorphone Itching    Metabolic Disorder Labs: Lab Results  Component Value Date   HGBA1C 5.8 (H) 02/07/2020   No results found for: PROLACTIN Lab Results  Component Value Date   CHOL 286 (H) 02/07/2020   TRIG 360 (H) 02/07/2020   HDL 33 (L) 02/07/2020   CHOLHDL 8.7 (H) 02/07/2020   VLDL 39.2 04/08/2013   LDLCALC 182 (H) 02/07/2020   Lab Results  Component Value Date   TSH 1.17 04/08/2013   TSH 1.69 01/22/2012    Therapeutic Level Labs: No results found for: LITHIUM No results found for: VALPROATE No components found for:  CBMZ  Current Medications: Current Outpatient Medications  Medication Sig Dispense Refill  . acetaminophen (TYLENOL) 500 MG tablet Take 1,000 mg by mouth every 4 (four) hours as needed for mild pain.    Marland Kitchen albuterol (VENTOLIN HFA) 108 (90 Base) MCG/ACT inhaler Inhale 2 puffs into the lungs  every 6 (six) hours as needed for wheezing or shortness of breath. 18 g 1  . ALPRAZolam (XANAX) 1 MG tablet Take 1 tablet (1 mg total) by mouth 3 (three) times daily as needed for anxiety. 90 tablet 0  . ALPRAZolam (XANAX) 1 MG tablet Take 1 tablet (1 mg total) by mouth 3 (three) times daily as needed for anxiety. 90 tablet 0  . ALPRAZolam (XANAX) 1 MG tablet Take 1 tablet (1 mg total) by mouth 3 (three) times daily as needed for anxiety. 90 tablet 0  . amLODipine (NORVASC) 5 MG tablet Take 1 tablet (5 mg total) by mouth daily. 90 tablet 3  . atorvastatin (LIPITOR) 40 MG tablet Take 1  tablet (40 mg total) by mouth daily. 90 tablet 3  . carvedilol (COREG) 3.125 MG tablet Take 1 tablet (3.125 mg total) by mouth 2 (two) times daily with a meal. 60 tablet 2  . clonazePAM (KLONOPIN) 1 MG tablet Take 1 tablet (1 mg total) by mouth 3 (three) times daily as needed for anxiety. Dont fill until July 30 90 tablet 2  . diphenhydrAMINE (BENADRYL) 25 mg capsule Take 25 mg by mouth at bedtime.    Marland Kitchen FLUoxetine (PROZAC) 20 MG capsule Take 3 capsules (60 mg total) by mouth daily. 90 capsule 2  . Fluticasone-Salmeterol (ADVAIR DISKUS) 100-50 MCG/DOSE AEPB Inhale 1 puff into the lungs 2 (two) times daily. 60 each 6  . ibuprofen (ADVIL,MOTRIN) 200 MG tablet Take 400 mg by mouth every 4 (four) hours as needed for headache or mild pain.    Marland Kitchen losartan (COZAAR) 100 MG tablet Take 1 tablet (100 mg total) by mouth daily. Patient has transportation issues. Needs to refill early 90 tablet 1  . meloxicam (MOBIC) 7.5 MG tablet Take 1 tablet (7.5 mg total) by mouth daily. 30 tablet 1   No current facility-administered medications for this visit.     Musculoskeletal: Strength & Muscle Tone: Unable to assess due to telemedicine visit Gait & Station: Unable to assess due to telemedicine visit Patient leans: Unable to assess due to telemedicine visit  Psychiatric Specialty Exam: Review of Systems  Psychiatric/Behavioral:  Negative for agitation, decreased concentration, dysphoric mood, hallucinations, sleep disturbance and suicidal ideas. The patient is nervous/anxious. The patient is not hyperactive.     There were no vitals taken for this visit.There is no height or weight on file to calculate BMI.  General Appearance: Unable to assess due to telemedicine visit  Eye Contact:  Unable to assess due to telemedicine visit  Speech:  Clear and Coherent and Normal Rate  Volume:  Normal  Mood:  Anxious and Euthymic  Affect:  Appropriate and Congruent  Thought Process:  Coherent and Descriptions of Associations: Intact  Orientation:  Full (Time, Place, and Person)  Thought Content: WDL and Logical   Suicidal Thoughts:  No  Homicidal Thoughts:  No  Memory:  Immediate;   Good Recent;   Good Remote;   Good  Judgement:  Good  Insight:  Good  Psychomotor Activity:  Normal  Concentration:  Concentration: Good and Attention Span: Good  Recall:  Good  Fund of Knowledge: Good  Language: Good  Akathisia:  NA  Handed:  Right  AIMS (if indicated): not done  Assets:  Communication Skills Desire for Improvement Financial Resources/Insurance Housing Resilience Social Support  ADL's:  Intact  Cognition: WNL  Sleep:  Fair   Screenings: GAD-7   Flowsheet Row Office Visit from [facility]/28/2020 in [facility]  Total GAD-7 Score 12    PHQ2-9   Flowsheet Row Office Visit from 04/28/2020 in [facility]  PHQ-2 Total Score 6  PHQ-9 Total Score 12       Assessment and Plan:   Kimberly Adkins is a 65 year old female with a past psychiatric history significant for OCD, PTSD, anxiety, and major depressive disorder who presents to [facility] via virtual telephone visit for follow-up and medication management. Patient reports that her medications are treating her well and denies the need for dosage adjustments at this time.   Patient is requesting refills on all of her medications.  Patient's medications will be e-prescribed to  pharmacy of choice.  1. Anxiety state  - FLUoxetine (PROZAC) 20 MG capsule; Take 3 capsules (60 mg total) by mouth daily.  Dispense: 90 capsule; Refill: 2 - ALPRAZolam (XANAX) 1 MG tablet; Take 1 tablet (1 mg total) by mouth 3 (three) times daily as needed for anxiety.  Dispense: 90 tablet; Refill: 0 - ALPRAZolam (XANAX) 1 MG tablet; Take 1 tablet (1 mg total) by mouth 3 (three) times daily as needed for anxiety.  Dispense: 90 tablet; Refill: 0 - ALPRAZolam (XANAX) 1 MG tablet; Take 1 tablet (1 mg total) by mouth 3 (three) times daily as needed for anxiety.  Dispense: 90 tablet; Refill: 0  2. Moderate episode of recurrent major depressive disorder (HCC)  - FLUoxetine (PROZAC) 20 MG capsule; Take 3 capsules (60 mg total) by mouth daily.  Dispense: 90 capsule; Refill: 2  Patient to follow-up in 2 months  Meta Hatchet, PA 11/09/2020, 1:05 PM

## 2020-11-14 ENCOUNTER — Ambulatory Visit: Payer: Self-pay | Admitting: Nurse Practitioner

## 2020-11-28 ENCOUNTER — Encounter (HOSPITAL_COMMUNITY): Payer: Self-pay | Admitting: Physician Assistant

## 2020-11-28 ENCOUNTER — Other Ambulatory Visit: Payer: Self-pay

## 2020-11-28 ENCOUNTER — Telehealth (INDEPENDENT_AMBULATORY_CARE_PROVIDER_SITE_OTHER): Payer: No Payment, Other | Admitting: Physician Assistant

## 2020-11-28 DIAGNOSIS — F331 Major depressive disorder, recurrent, moderate: Secondary | ICD-10-CM

## 2020-11-28 DIAGNOSIS — F411 Generalized anxiety disorder: Secondary | ICD-10-CM | POA: Diagnosis not present

## 2020-11-28 MED ORDER — FLUOXETINE HCL 20 MG PO CAPS: 60.0000 mg | ORAL_CAPSULE | Freq: Every day | ORAL | 2 refills | Status: DC

## 2020-11-28 MED ORDER — ALPRAZOLAM 1 MG PO TABS: 1.0000 mg | ORAL_TABLET | Freq: Three times a day (TID) | ORAL | 2 refills | Status: DC | PRN

## 2020-11-28 NOTE — Progress Notes (Signed)
BH MD/PA/NP OP Progress Note  Virtual Visit via Telephone Note  I connected with Kimberly ParesJanean L Zumstein on 11/28/20 at  3:30 PM EST by telephone and verified that I am speaking with the correct person using two identifiers.  Location: Patient: Home Provider: Clinic   I discussed the limitations, risks, security and privacy concerns of performing an evaluation and management service by telephone and the availability of in person appointments. I also discussed with the patient that there may be a patient responsible charge related to this service. The patient expressed understanding and agreed to proceed.  Follow Up Instructions:  I discussed the assessment and treatment plan with the patient. The patient was provided an opportunity to ask questions and all were answered. The patient agreed with the plan and demonstrated an understanding of the instructions.   The patient was advised to call back or seek an in-person evaluation if the symptoms worsen or if the condition fails to improve as anticipated.  I provided 29 minutes of non-face-to-face time during this encounter.  Meta HatchetUchenna E Avyana Puffenbarger, PA   11/28/2020 5:20 PM Kimberly Adkins  MRN:  [id number]  Chief Complaint: Follow up and medication management  HPI:   Kimberly Adkins is a 65 year old female with a past psychiatric history significant for OCD, PTSD, anxiety, and major depressive disorder who [facility] via virtual telephone visit for follow-up and medication management.  Patient is currently being managed on the following medications:  Xanax 1 mg 3 times daily as needed for the management of anxiety Prozac 60 mg 1 time daily  Patient reports that she has been doing pretty good and that she is currently living with her sister.  Patient reports no issues or concerns with her current medication regimen.  Patient denies the need for dosage adjustments at this time.  Patient is  requesting medication refills upon conclusion of the encounter.  A PHQ 9 screen was performed today with the patient scoring a 6 which is indicative of mild depression.  A GAD-7 screen was also performed with the patient scoring an 8 indicative of anxiety.  Patient reports that she has been happier lately due to spending time with her grandkids.  Patient is pleasant, calm, cooperative, and fully engaged and conversation during the encounter.  Patient reports being in a good mood.  Patient denies suicidal or homicidal ideations.  She further denies auditory or visual hallucinations.  Patient reports good sleep and receives on average 12 hours of restful sleep.  Patient endorses good appetite and eats on average of 2 meals per day.  Patient denies alcohol consumption and illicit drug use.  Patient endorses tobacco use and smokes roughly half a pack of cigarettes per day.  Visit Diagnosis:    ICD-10-CM   1. Anxiety state  F41.1   2. Moderate episode of recurrent major depressive disorder (HCC)  F33.1     Past Psychiatric History: Obsessive-Compulsive Disorder PTSD Major depressive disorder Anxiety  Past Medical History:  Past Medical History:  Diagnosis Date  . Anxiety   . Depression   . GERD (gastroesophageal reflux disease)   . Hyperlipidemia   . Hypertension   . Osteoarthritis   . PTSD (post-traumatic stress disorder) 2010    Past Surgical History:  Procedure Laterality Date  . ANTERIOR AND POSTERIOR REPAIR  2007  . BREAST BIOPSY  1997   right; benign  . TUBAL LIGATION  1994    Family Psychiatric History:  Unknown  Family History:  Family History  Problem Relation Age of Onset  . Breast cancer Paternal Grandmother        Age 16  . Colon polyps Paternal Grandmother 72  . Colon polyps Maternal Grandfather 60  . Cancer Mother        Breast cancer  . Breast cancer Mother        Age 53  . Coronary artery disease Father   . Hypertension Father   . Breast cancer Paternal  Aunt        Age 39's  . Cancer Maternal Grandmother        colon  . Heart attack Daughter     Social History:  Social History   Socioeconomic History  . Marital status: Widowed    Spouse name: Not on file  . Number of children: Not on file  . Years of education: Not on file  . Highest education level: Not on file  Occupational History  . Occupation: unemplyed  Tobacco Use  . Smoking status: Current Every Day Smoker    Packs/day: 1.50    Years: 22.00    Pack years: 33.00    Types: Cigarettes  . Smokeless tobacco: Never Used  Vaping Use  . Vaping Use: Never used  Substance and Sexual Activity  . Alcohol use: Yes  . Drug use: No  . Sexual activity: Never    Birth control/protection: Post-menopausal  Other Topics Concern  . Not on file  Social History Narrative  . Not on file   Social Determinants of Health   Financial Resource Strain: Not on file  Food Insecurity: Not on file  Transportation Needs: Not on file  Physical Activity: Not on file  Stress: Not on file  Social Connections: Not on file    Allergies:  Allergies  Allergen Reactions  . Hydromorphone Itching    Metabolic Disorder Labs: Lab Results  Component Value Date   HGBA1C 5.8 (H) 02/07/2020   No results found for: PROLACTIN Lab Results  Component Value Date   CHOL 286 (H) 02/07/2020   TRIG 360 (H) 02/07/2020   HDL 33 (L) 02/07/2020   CHOLHDL 8.7 (H) 02/07/2020   VLDL 39.2 04/08/2013   LDLCALC 182 (H) 02/07/2020   Lab Results  Component Value Date   TSH 1.17 04/08/2013   TSH 1.69 01/22/2012    Therapeutic Level Labs: No results found for: LITHIUM No results found for: VALPROATE No components found for:  CBMZ  Current Medications: Current Outpatient Medications  Medication Sig Dispense Refill  . acetaminophen (TYLENOL) 500 MG tablet Take 1,000 mg by mouth every 4 (four) hours as needed for mild pain.    Marland Kitchen albuterol (VENTOLIN HFA) 108 (90 Base) MCG/ACT inhaler Inhale 2 puffs into  the lungs every 6 (six) hours as needed for wheezing or shortness of breath. 18 g 1  . ALPRAZolam (XANAX) 1 MG tablet Take 1 tablet (1 mg total) by mouth 3 (three) times daily as needed for anxiety. 90 tablet 0  . ALPRAZolam (XANAX) 1 MG tablet Take 1 tablet (1 mg total) by mouth 3 (three) times daily as needed for anxiety. 90 tablet 0  . ALPRAZolam (XANAX) 1 MG tablet Take 1 tablet (1 mg total) by mouth 3 (three) times daily as needed for anxiety. 90 tablet 0  . amLODipine (NORVASC) 5 MG tablet Take 1 tablet (5 mg total) by mouth daily. 90 tablet 3  . atorvastatin (LIPITOR) 40 MG tablet Take 1 tablet (40 mg total) by  mouth daily. 90 tablet 3  . carvedilol (COREG) 3.125 MG tablet Take 1 tablet (3.125 mg total) by mouth 2 (two) times daily with a meal. 60 tablet 2  . clonazePAM (KLONOPIN) 1 MG tablet Take 1 tablet (1 mg total) by mouth 3 (three) times daily as needed for anxiety. Dont fill until July 30 90 tablet 2  . diphenhydrAMINE (BENADRYL) 25 mg capsule Take 25 mg by mouth at bedtime.    Marland Kitchen FLUoxetine (PROZAC) 20 MG capsule Take 3 capsules (60 mg total) by mouth daily. 90 capsule 2  . Fluticasone-Salmeterol (ADVAIR DISKUS) 100-50 MCG/DOSE AEPB Inhale 1 puff into the lungs 2 (two) times daily. 60 each 6  . ibuprofen (ADVIL,MOTRIN) 200 MG tablet Take 400 mg by mouth every 4 (four) hours as needed for headache or mild pain.    Marland Kitchen losartan (COZAAR) 100 MG tablet Take 1 tablet (100 mg total) by mouth daily. Patient has transportation issues. Needs to refill early 90 tablet 1  . meloxicam (MOBIC) 7.5 MG tablet Take 1 tablet (7.5 mg total) by mouth daily. 30 tablet 1   No current facility-administered medications for this visit.     Musculoskeletal: Strength & Muscle Tone: Unable to assess due to telemedicine visit Gait & Station: Unable to assess due to telemedicine visit Patient leans: Unable to assess due to telemedicine visit  Psychiatric Specialty Exam: Review of Systems  There were no  vitals taken for this visit.There is no height or weight on file to calculate BMI.  General Appearance: Unable to assess due to telemedicine visit  Eye Contact:  Unable to assess due to telemedicine visit  Speech:  Clear and Coherent and Normal Rate  Volume:  Normal  Mood:  Euthymic  Affect:  Appropriate  Thought Process:  Coherent and Descriptions of Associations: Intact  Orientation:  Full (Time, Place, and Person)  Thought Content: WDL   Suicidal Thoughts:  No  Homicidal Thoughts:  No  Memory:  Immediate;   Good Recent;   Good Remote;   Good  Judgement:  Good  Insight:  Good  Psychomotor Activity:  Normal  Concentration:  Concentration: Good and Attention Span: Good  Recall:  Good  Fund of Knowledge: Good  Language: Good  Akathisia:  NA  Handed:  Right  AIMS (if indicated): not done  Assets:  Communication Skills Desire for Improvement Financial Resources/Insurance Housing Resilience Social Support  ADL's:  Intact  Cognition: WNL  Sleep:  Good   Screenings: GAD-7   Flowsheet Row Video Visit from 11/28/2020 in [facility] Office Visit from 04/28/2020 in [facility]  Total GAD-7 Score 8 12    PHQ2-9   Flowsheet Row Video Visit from 11/28/2020 in [facility] Office Visit from 04/28/2020 in [facility]  PHQ-2 Total Score 3 6  PHQ-9 Total Score 6 12    Flowsheet Row Video Visit from 11/28/2020 in [facility]  C-SSRS RISK CATEGORY No Risk       Assessment and Plan:   Kimberly Adkins is a 65 year old female with a past psychiatric history significant for OCD, PTSD, anxiety, and major depressive disorder who presents to [facility] via virtual telephone visit for follow-up and medication management. Patient reports no issues or concerns with her current medication regimen.  Patient denies  the need for dosage adjustments at this time.  Patient's medications will be e-prescribed to pharmacy  of choice.  1. Anxiety state  - ALPRAZolam (XANAX) 1 MG tablet; Take 1 tablet (1 mg total) by mouth 3 (three) times daily as needed for anxiety.  Dispense: 90 tablet; Refill: 2 - FLUoxetine (PROZAC) 20 MG capsule; Take 3 capsules (60 mg total) by mouth daily.  Dispense: 90 capsule; Refill: 2  2. Moderate episode of recurrent major depressive disorder (HCC)  - FLUoxetine (PROZAC) 20 MG capsule; Take 3 capsules (60 mg total) by mouth daily.  Dispense: 90 capsule; Refill: 2  Patient to follow-up in 3 months  Meta Hatchet, PA 11/28/2020, 5:20 PM

## 2021-01-01 ENCOUNTER — Ambulatory Visit: Payer: Self-pay | Attending: Nurse Practitioner | Admitting: Nurse Practitioner

## 2021-01-01 ENCOUNTER — Other Ambulatory Visit: Payer: Self-pay | Admitting: Nurse Practitioner

## 2021-01-01 ENCOUNTER — Other Ambulatory Visit: Payer: Self-pay

## 2021-01-01 ENCOUNTER — Encounter: Payer: Self-pay | Admitting: Nurse Practitioner

## 2021-01-01 VITALS — BP 144/82 | HR 76 | Resp 16 | Wt 200.4 lb

## 2021-01-01 DIAGNOSIS — M2550 Pain in unspecified joint: Secondary | ICD-10-CM

## 2021-01-01 DIAGNOSIS — R7303 Prediabetes: Secondary | ICD-10-CM

## 2021-01-01 DIAGNOSIS — J42 Unspecified chronic bronchitis: Secondary | ICD-10-CM

## 2021-01-01 DIAGNOSIS — Z1231 Encounter for screening mammogram for malignant neoplasm of breast: Secondary | ICD-10-CM

## 2021-01-01 DIAGNOSIS — I10 Essential (primary) hypertension: Secondary | ICD-10-CM

## 2021-01-01 DIAGNOSIS — R7989 Other specified abnormal findings of blood chemistry: Secondary | ICD-10-CM

## 2021-01-01 DIAGNOSIS — E785 Hyperlipidemia, unspecified: Secondary | ICD-10-CM

## 2021-01-01 DIAGNOSIS — Z1211 Encounter for screening for malignant neoplasm of colon: Secondary | ICD-10-CM

## 2021-01-01 MED ORDER — MELOXICAM 7.5 MG PO TABS: 7.5000 mg | ORAL_TABLET | Freq: Every day | ORAL | 3 refills | Status: DC

## 2021-01-01 MED ORDER — ALBUTEROL SULFATE HFA 108 (90 BASE) MCG/ACT IN AERS: 2.0000 | INHALATION_SPRAY | Freq: Four times a day (QID) | RESPIRATORY_TRACT | 1 refills | Status: DC | PRN

## 2021-01-01 MED ORDER — ATORVASTATIN CALCIUM 40 MG PO TABS: 40.0000 mg | ORAL_TABLET | Freq: Every day | ORAL | 3 refills | Status: DC

## 2021-01-01 MED ORDER — AMLODIPINE BESYLATE 10 MG PO TABS: 10.0000 mg | ORAL_TABLET | Freq: Every day | ORAL | 1 refills | Status: DC

## 2021-01-01 MED ORDER — CARVEDILOL 3.125 MG PO TABS: 3.1250 mg | ORAL_TABLET | Freq: Two times a day (BID) | ORAL | 1 refills | Status: DC

## 2021-01-01 MED ORDER — FLUTICASONE-SALMETEROL 100-50 MCG/DOSE IN AEPB: 1.0000 | INHALATION_SPRAY | Freq: Two times a day (BID) | RESPIRATORY_TRACT | 6 refills | Status: DC

## 2021-01-01 MED ORDER — LOSARTAN POTASSIUM 100 MG PO TABS: 100.0000 mg | ORAL_TABLET | Freq: Every day | ORAL | 1 refills | Status: DC

## 2021-01-01 MED FILL — AMLODIPINE BESYLATE 10 MG T: 10 | 30 days supply | Qty: 30 | Fill #0

## 2021-01-01 MED FILL — FLUTICASONE-SALMETEROL 100-: 100-50 | 30 days supply | Qty: 60 | Fill #0

## 2021-01-01 MED FILL — MELOXICAM 7.5 MG TABLET: 7.5 | 30 days supply | Qty: 30 | Fill #0

## 2021-01-01 MED FILL — ALBUTEROL SULFATE HFA 108 (: 108 (90 BAS | 25 days supply | Qty: 18 | Fill #0

## 2021-01-01 MED FILL — ATORVASTATIN CALCIUM 40 MG: 40 | 30 days supply | Qty: 30 | Fill #0

## 2021-01-01 MED FILL — LOSARTAN POTASSIUM 100 MG T: 100 | 30 days supply | Qty: 30 | Fill #0

## 2021-01-01 MED FILL — CARVEDILOL 3.125 MG TABLET: 3.125 | 30 days supply | Qty: 60 | Fill #0

## 2021-01-01 NOTE — Patient Instructions (Signed)
Please call [phone] or [phone] to schedule your mammogram

## 2021-01-01 NOTE — Progress Notes (Signed)
Assessment & Plan:  Kimberly Adkins was seen today for hypertension.  Diagnoses and all orders for this visit:  Essential hypertension -     CMP14+EGFR -     losartan (COZAAR) 100 MG tablet; Take 1 tablet (100 mg total) by mouth daily. Patient has transportation issues. Needs to refill early -     carvedilol (COREG) 3.125 MG tablet; Take 1 tablet (3.125 mg total) by mouth 2 (two) times daily with a meal. -     amLODipine (NORVASC) 10 MG tablet; Take 1 tablet (10 mg total) by mouth daily. Continue all antihypertensives as prescribed.  Remember to bring in your blood pressure log with you for your follow up appointment.  DASH/Mediterranean Diets are healthier choices for HTN.    Dyslipidemia -     Lipid panel -     atorvastatin (LIPITOR) 40 MG tablet; Take 1 tablet (40 mg total) by mouth daily. INSTRUCTIONS: Work on a low fat, heart healthy diet and participate in regular aerobic exercise program by working out at least 150 minutes per week; 5 days a week-30 minutes per day. Avoid red meat/beef/steak,  fried foods. junk foods, sodas, sugary drinks, unhealthy snacking, alcohol and smoking.  Drink at least 80 oz of water per day and monitor your carbohydrate intake daily.    Prediabetes -     Hemoglobin A1c  Chronic bronchitis, unspecified chronic bronchitis type (HCC) -     albuterol (VENTOLIN HFA) 108 (90 Base) MCG/ACT inhaler; Inhale 2 puffs into the lungs every 6 (six) hours as needed for wheezing or shortness of breath. -     Fluticasone-Salmeterol (ADVAIR DISKUS) 100-50 MCG/DOSE AEPB; Inhale 1 puff into the lungs 2 (two) times daily.  Arthralgia of multiple joints -     meloxicam (MOBIC) 7.5 MG tablet; Take 1 tablet (7.5 mg total) by mouth daily. Work on losing weight to help reduce joint pain. May alternate with heat and ice application for pain relief. May also alternate with acetaminophen as prescribed pain relief. Other alternatives include massage, acupuncture and water aerobics.  You  must stay active and avoid a sedentary lifestyle.   Breast cancer screening by mammogram -     MM 3D SCREEN BREAST BILATERAL; Future  Colon cancer screening -     Fecal occult blood, imunochemical([facility])  Abnormal CBC -     CBC    Patient has been counseled on age-appropriate routine health concerns for screening and prevention. These are reviewed and up-to-date. Referrals have been placed accordingly. Immunizations are up-to-date or declined.    Subjective:   Chief Complaint  Patient presents with  . Hypertension   HPI Kimberly Adkins 65 y.o. female presents to office today for follow up  has a past medical history of Anxiety, Depression, GERD (gastroesophageal reflux disease), Hyperlipidemia, Hypertension, Osteoarthritis, and PTSD (post-traumatic stress disorder) (2010). Patient has been counseled on age-appropriate routine health concerns for screening and prevention. These are reviewed and up-to-date. Referrals have been placed accordingly. Immunizations are up-to-date or declined.    MAMMOGRAM: She declines referral PAP Smear: Declines She sees psychiatry for anxiety and depression. Taking prozac and xanax as prescribed.    Essential Hypertension States she has a lot of stressors. She does not have her own home since her spouse died. Currently living with her sister. She does smoke cigarettes however has cut back significantly. Denies chest pain, shortness of breath, palpitations, lightheadedness, dizziness, headaches or BLE edema. Currently taking amlodipine 5 mg daily and losartan 100 mg daily.  Will increase amlodipine from 5 mg to 10 mg daily.  BP Readings from Last 3 Encounters:  01/01/21 (!) 158/95  07/12/20 129/82  06/16/20 (!) 155/95   COPD Symptoms controlled with Advair daily and prn albuterol.   Prediabetes Currently diet controlled.    Dyslipidemia Poorly controlled. She endorses medication adherence taking atorvastatin 40 mg daily as  prescribed.  Lab Results  Component Value Date   CHOL 286 (H) 02/07/2020   CHOL 271 (H) 04/08/2013   CHOL 286 (H) 01/22/2012   Lab Results  Component Value Date   HDL 33 (L) 02/07/2020   HDL 38.90 (L) 04/08/2013   HDL 52.10 01/22/2012   Lab Results  Component Value Date   LDLCALC 182 (H) 02/07/2020   Lab Results  Component Value Date   TRIG 360 (H) 02/07/2020   TRIG 196.0 (H) 04/08/2013   TRIG 227.0 (H) 01/22/2012   Lab Results  Component Value Date   CHOLHDL 8.7 (H) 02/07/2020   CHOLHDL 7 04/08/2013   CHOLHDL 5 01/22/2012   Lab Results  Component Value Date   LDLDIRECT 195.9 04/08/2013   LDLDIRECT 209.1 01/22/2012   LDLDIRECT 189.9 09/21/2009   Review of Systems  Constitutional: Negative for fever, malaise/fatigue and weight loss.  HENT: Negative.  Negative for nosebleeds.   Eyes: Negative.  Negative for blurred vision, double vision and photophobia.  Respiratory: Negative.  Negative for cough and shortness of breath.   Cardiovascular: Negative.  Negative for chest pain, palpitations and leg swelling.  Gastrointestinal: Negative.  Negative for heartburn, nausea and vomiting.  Musculoskeletal: Positive for back pain, joint pain and myalgias.  Neurological: Negative.  Negative for dizziness, focal weakness, seizures and headaches.  Psychiatric/Behavioral: Positive for depression. Negative for suicidal ideas. The patient is nervous/anxious. The patient does not have insomnia.     Past Medical History:  Diagnosis Date  . Anxiety   . Depression   . GERD (gastroesophageal reflux disease)   . Hyperlipidemia   . Hypertension   . Osteoarthritis   . PTSD (post-traumatic stress disorder) 2010    Past Surgical History:  Procedure Laterality Date  . ANTERIOR AND POSTERIOR REPAIR  2007  . BREAST BIOPSY  1997   right; benign  . TUBAL LIGATION  1994    Family History  Problem Relation Age of Onset  . Breast cancer Paternal Grandmother        Age 71  . Colon  polyps Paternal Grandmother 29  . Colon polyps Maternal Grandfather 66  . Cancer Mother        Breast cancer  . Breast cancer Mother        Age 8  . Coronary artery disease Father   . Hypertension Father   . Breast cancer Paternal Aunt        Age 27's  . Cancer Maternal Grandmother        colon  . Heart attack Daughter     Social History Reviewed with no changes to be made today.   Outpatient Medications Prior to Visit  Medication Sig Dispense Refill  . acetaminophen (TYLENOL) 500 MG tablet Take 1,000 mg by mouth every 4 (four) hours as needed for mild pain.    Marland Kitchen ALPRAZolam (XANAX) 1 MG tablet Take 1 tablet (1 mg total) by mouth 3 (three) times daily as needed for anxiety. 90 tablet 2  . diphenhydrAMINE (BENADRYL) 25 mg capsule Take 25 mg by mouth at bedtime.    Marland Kitchen FLUoxetine (PROZAC) 20 MG capsule Take 3  capsules (60 mg total) by mouth daily. 90 capsule 2  . ibuprofen (ADVIL,MOTRIN) 200 MG tablet Take 400 mg by mouth every 4 (four) hours as needed for headache or mild pain.    Marland Kitchen albuterol (VENTOLIN HFA) 108 (90 Base) MCG/ACT inhaler Inhale 2 puffs into the lungs every 6 (six) hours as needed for wheezing or shortness of breath. 18 g 1  . amLODipine (NORVASC) 5 MG tablet Take 1 tablet (5 mg total) by mouth daily. 90 tablet 3  . atorvastatin (LIPITOR) 40 MG tablet Take 1 tablet (40 mg total) by mouth daily. 90 tablet 3  . carvedilol (COREG) 3.125 MG tablet Take 1 tablet (3.125 mg total) by mouth 2 (two) times daily with a meal. 60 tablet 2  . Fluticasone-Salmeterol (ADVAIR DISKUS) 100-50 MCG/DOSE AEPB Inhale 1 puff into the lungs 2 (two) times daily. 60 each 6  . losartan (COZAAR) 100 MG tablet Take 1 tablet (100 mg total) by mouth daily. Patient has transportation issues. Needs to refill early 90 tablet 1  . meloxicam (MOBIC) 7.5 MG tablet Take 1 tablet (7.5 mg total) by mouth daily. 30 tablet 1   No facility-administered medications prior to visit.    Allergies  Allergen  Reactions  . Hydromorphone Itching       Objective:    BP (!) 158/95   Pulse 76   Resp 16   Wt 200 lb 6.4 oz (90.9 kg)   SpO2 97%   BMI 34.22 kg/m  Wt Readings from Last 3 Encounters:  01/01/21 200 lb 6.4 oz (90.9 kg)  04/28/20 204 lb 3.2 oz (92.6 kg)  03/08/20 204 lb 9.6 oz (92.8 kg)    Physical Exam       Patient has been counseled extensively about nutrition and exercise as well as the importance of adherence with medications and regular follow-up. The patient was given clear instructions to go to ER or return to medical center if symptoms don't improve, worsen or new problems develop. The patient verbalized understanding.   Follow-up: Return in about 3 months (around 04/03/2021) for follow up.   Gildardo Pounds, FNP-BC [facility] [place], [place]   01/01/2021, 2:06 PM

## 2021-01-02 LAB — CMP14+EGFR
ALT: 11 IU/L (ref 0–32)
AST: 13 IU/L (ref 0–40)
Albumin/Globulin Ratio: 1.6 (ref 1.2–2.2)
Albumin: 4 g/dL (ref 3.8–4.8)
Alkaline Phosphatase: 117 IU/L (ref 44–121)
BUN/Creatinine Ratio: 6 — ABNORMAL LOW (ref 12–28)
BUN: 4 mg/dL — ABNORMAL LOW (ref 8–27)
Bilirubin Total: 0.3 mg/dL (ref 0.0–1.2)
CO2: 23 mmol/L (ref 20–29)
Calcium: 8.7 mg/dL (ref 8.7–10.3)
Chloride: 102 mmol/L (ref 96–106)
Creatinine, Ser: 0.68 mg/dL (ref 0.57–1.00)
Globulin, Total: 2.5 g/dL (ref 1.5–4.5)
Glucose: 95 mg/dL (ref 65–99)
Potassium: 3.5 mmol/L (ref 3.5–5.2)
Sodium: 141 mmol/L (ref 134–144)
Total Protein: 6.5 g/dL (ref 6.0–8.5)
eGFR: 98 mL/min/{1.73_m2} (ref >59)

## 2021-01-02 LAB — CBC
Hematocrit: 35.1 % (ref 34.0–46.6)
Hemoglobin: 11.6 g/dL (ref 11.1–15.9)
MCH: 28.4 pg (ref 26.6–33.0)
MCHC: 33 g/dL (ref 31.5–35.7)
MCV: 86 fL (ref 79–97)
Platelets: 356 10*3/uL (ref 150–450)
RBC: 4.08 x10E6/uL (ref 3.77–5.28)
RDW: 15.3 % (ref 11.7–15.4)
WBC: 8.1 10*3/uL (ref 3.4–10.8)

## 2021-01-02 LAB — LIPID PANEL
Chol/HDL Ratio: 5 ratio — ABNORMAL HIGH (ref 0.0–4.4)
Cholesterol, Total: 170 mg/dL (ref 100–199)
HDL: 34 mg/dL — ABNORMAL LOW (ref >39)
LDL Chol Calc (NIH): 104 mg/dL — ABNORMAL HIGH (ref 0–99)
Triglycerides: 180 mg/dL — ABNORMAL HIGH (ref 0–149)
VLDL Cholesterol Cal: 32 mg/dL (ref 5–40)

## 2021-01-02 LAB — HEMOGLOBIN A1C
Est. average glucose Bld gHb Est-mCnc: 120 mg/dL
Hgb A1c MFr Bld: 5.8 % — ABNORMAL HIGH (ref 4.8–5.6)

## 2021-02-12 ENCOUNTER — Telehealth: Payer: Self-pay | Admitting: Nurse Practitioner

## 2021-02-12 NOTE — Telephone Encounter (Signed)
Copied from CRM [id number]. Topic: General - Inquiry >> Feb 09, 2021  3:40 PM Kimberly Adkins D wrote: Reason for CRM: Pt called saying she wants to know if her blood pressure medication can make her feet and ankles swollen.  She has experiencing this lately.  CB#  [phone]

## 2021-02-12 NOTE — Telephone Encounter (Signed)
Will route to PCP for review. 

## 2021-02-15 ENCOUNTER — Other Ambulatory Visit: Payer: Self-pay

## 2021-02-15 NOTE — Telephone Encounter (Signed)
Vm was left informing patient to obtain stocking for swelling.

## 2021-02-19 ENCOUNTER — Other Ambulatory Visit: Payer: Self-pay

## 2021-02-19 MED FILL — Losartan Potassium Tab 100 MG: ORAL | 30 days supply | Qty: 30 | Fill #0 | Status: CN

## 2021-02-19 MED FILL — Amlodipine Besylate Tab 10 MG (Base Equivalent): ORAL | 30 days supply | Qty: 30 | Fill #0 | Status: CN

## 2021-02-20 ENCOUNTER — Other Ambulatory Visit: Payer: Self-pay

## 2021-02-20 ENCOUNTER — Telehealth (INDEPENDENT_AMBULATORY_CARE_PROVIDER_SITE_OTHER): Payer: No Payment, Other | Admitting: Physician Assistant

## 2021-02-20 ENCOUNTER — Encounter (HOSPITAL_COMMUNITY): Payer: Self-pay | Admitting: Physician Assistant

## 2021-02-20 DIAGNOSIS — F330 Major depressive disorder, recurrent, mild: Secondary | ICD-10-CM

## 2021-02-20 DIAGNOSIS — F411 Generalized anxiety disorder: Secondary | ICD-10-CM

## 2021-02-20 DIAGNOSIS — F331 Major depressive disorder, recurrent, moderate: Secondary | ICD-10-CM

## 2021-02-20 MED ORDER — FLUOXETINE HCL 20 MG PO CAPS: 60.0000 mg | ORAL_CAPSULE | Freq: Every day | ORAL | 2 refills | Status: DC

## 2021-02-20 MED ORDER — ALPRAZOLAM 1 MG PO TABS: 1.0000 mg | ORAL_TABLET | Freq: Three times a day (TID) | ORAL | 2 refills | Status: DC | PRN

## 2021-02-20 NOTE — Progress Notes (Signed)
BH MD/PA/NP OP Progress Note  Virtual Visit via Telephone Note  I connected with Kimberly ParesJanean L Offer on 02/20/21 at  3:00 PM EDT by telephone and verified that I am speaking with the correct person using two identifiers.  Location: Patient: Home Provider: Clinic   I discussed the limitations, risks, security and privacy concerns of performing an evaluation and management service by telephone and the availability of in person appointments. I also discussed with the patient that there may be a patient responsible charge related to this service. The patient expressed understanding and agreed to proceed.  Follow Up Instructions:  I discussed the assessment and treatment plan with the patient. The patient was provided an opportunity to ask questions and all were answered. The patient agreed with the plan and demonstrated an understanding of the instructions.   The patient was advised to call back or seek an in-person evaluation if the symptoms worsen or if the condition fails to improve as anticipated.  I provided 17 minutes of non-face-to-face time during this encounter.  Meta HatchetUchenna E Sofia Jaquith, PA   02/20/2021 3:52 PM Kimberly Adkins  MRN:  [id number]  Chief Complaint: Follow-up and medication management  HPI:   Kimberly Adkins is a 65 year old female with a past psychiatric history significant for obsessive-compulsive disorder, PTSD, major depressive disorder and anxiety [facility] telephone visit for follow-up and medication management.  Patient is currently being managed on the following medications:  Xanax 1 mg 3 times daily as needed Prozac 60 mg daily  Patient reports no issues or concerns regarding her current regimen of medications.  Patient denies a need for dosage adjustments at this time and is requesting refills on all her medications.  Patient denies any concerns regarding her mental health.  Patient reports that  she has experienced some weight gain which she believes may be due to her hypertensive medications.  Provider advised patient to discuss adjusting her hypertensive medications with her primary care provider.  Patient reports that she is currently living with her sister at the moment.  Originally, patient was living with her daughter and her daughter's husband but she states that she feels that her daughter's husband has brainwashed her daughter.  Patient continues to ruminate about the family members and dog that have passed away over the years.  Patient is pleasant, calm, cooperative, and fully engaged in conversation during the encounter.  Patient reports that she is just doing "ok."  Patient denies suicidal or homicidal ideations.  She further denies auditory or visual hallucinations and does not appear to be responding to internal/external stimuli.  Patient endorses good sleep and receives on average 8 hours of sleep each night.  Patient endorses good appetite and eats on average 2 meals per day.  She endorses occasional alcohol consumption and has 1-2 margaritas each day.  Patient endorses tobacco use and smoke less than half a pack a day.  Patient denies illicit drug use.  Visit Diagnosis:    ICD-10-CM   1. Moderate episode of recurrent major depressive disorder (HCC)  F33.1     Past Psychiatric History:  Obsessive-Compulsive Disorder PTSD Major depressive disorder Anxiety  Past Medical History:  Past Medical History:  Diagnosis Date  . Anxiety   . Depression   . GERD (gastroesophageal reflux disease)   . Hyperlipidemia   . Hypertension   . Osteoarthritis   . PTSD (post-traumatic stress disorder) 2010    Past Surgical History:  Procedure  Laterality Date  . ANTERIOR AND POSTERIOR REPAIR  2007  . BREAST BIOPSY  1997   right; benign  . TUBAL LIGATION  1994    Family Psychiatric History:  Unknown  Family History:  Family History  Problem Relation Age of Onset  . Breast  cancer Paternal Grandmother        Age 79  . Colon polyps Paternal Grandmother 1  . Colon polyps Maternal Grandfather 60  . Cancer Mother        Breast cancer  . Breast cancer Mother        Age 53  . Coronary artery disease Father   . Hypertension Father   . Breast cancer Paternal Aunt        Age 71's  . Cancer Maternal Grandmother        colon  . Heart attack Daughter     Social History:  Social History   Socioeconomic History  . Marital status: Widowed    Spouse name: Not on file  . Number of children: Not on file  . Years of education: Not on file  . Highest education level: Not on file  Occupational History  . Occupation: unemplyed  Tobacco Use  . Smoking status: Current Every Day Smoker    Packs/day: 1.50    Years: 22.00    Pack years: 33.00    Types: Cigarettes  . Smokeless tobacco: Never Used  Vaping Use  . Vaping Use: Never used  Substance and Sexual Activity  . Alcohol use: Yes  . Drug use: No  . Sexual activity: Never    Birth control/protection: Post-menopausal  Other Topics Concern  . Not on file  Social History Narrative  . Not on file   Social Determinants of Health   Financial Resource Strain: Not on file  Food Insecurity: Not on file  Transportation Needs: Not on file  Physical Activity: Not on file  Stress: Not on file  Social Connections: Not on file    Allergies:  Allergies  Allergen Reactions  . Hydromorphone Itching    Metabolic Disorder Labs: Lab Results  Component Value Date   HGBA1C 5.8 (H) 01/01/2021   No results found for: PROLACTIN Lab Results  Component Value Date   CHOL 170 01/01/2021   TRIG 180 (H) 01/01/2021   HDL 34 (L) 01/01/2021   CHOLHDL 5.0 (H) 01/01/2021   VLDL 39.2 04/08/2013   LDLCALC 104 (H) 01/01/2021   LDLCALC 182 (H) 02/07/2020   Lab Results  Component Value Date   TSH 1.17 04/08/2013   TSH 1.69 01/22/2012    Therapeutic Level Labs: No results found for: LITHIUM No results found for:  VALPROATE No components found for:  CBMZ  Current Medications: Current Outpatient Medications  Medication Sig Dispense Refill  . acetaminophen (TYLENOL) 500 MG tablet Take 1,000 mg by mouth every 4 (four) hours as needed for mild pain.    Marland Kitchen albuterol (VENTOLIN HFA) 108 (90 Base) MCG/ACT inhaler INHALE 2 PUFFS INTO THE LUNGS EVERY 6 (SIX) HOURS AS NEEDED FOR WHEEZING OR SHORTNESS OF BREATH. 18 g 1  . ALPRAZolam (XANAX) 1 MG tablet Take 1 tablet (1 mg total) by mouth 3 (three) times daily as needed for anxiety. 90 tablet 2  . amLODipine (NORVASC) 10 MG tablet TAKE 1 TABLET (10 MG TOTAL) BY MOUTH DAILY. 90 tablet 1  . atorvastatin (LIPITOR) 40 MG tablet TAKE 1 TABLET (40 MG TOTAL) BY MOUTH DAILY. 90 tablet 3  . carvedilol (COREG) 3.125 MG tablet  TAKE 1 TABLET (3.125 MG TOTAL) BY MOUTH 2 (TWO) TIMES DAILY WITH A MEAL. 180 tablet 1  . diphenhydrAMINE (BENADRYL) 25 mg capsule Take 25 mg by mouth at bedtime.    Marland Kitchen FLUoxetine (PROZAC) 20 MG capsule Take 3 capsules (60 mg total) by mouth daily. 90 capsule 2  . fluticasone-salmeterol (ADVAIR) 100-50 MCG/ACT AEPB Inhale 1 puff into the lungs 2 (two) times daily. 60 each 6  . ibuprofen (ADVIL,MOTRIN) 200 MG tablet Take 400 mg by mouth every 4 (four) hours as needed for headache or mild pain.    Marland Kitchen losartan (COZAAR) 100 MG tablet TAKE 1 TABLET (100 MG TOTAL) BY MOUTH DAILY. 90 tablet 1  . meloxicam (MOBIC) 7.5 MG tablet TAKE 1 TABLET (7.5 MG TOTAL) BY MOUTH DAILY. 30 tablet 3   No current facility-administered medications for this visit.     Musculoskeletal: Strength & Muscle Tone: Unable to assess due to telemedicine visit Gait & Station: Unable to assess due to telemedicine visit Patient leans: Unable to assess due to telemedicine visit  Psychiatric Specialty Exam: Review of Systems  Psychiatric/Behavioral: Negative for decreased concentration, dysphoric mood, hallucinations, self-injury, sleep disturbance and suicidal ideas. The patient is  nervous/anxious. The patient is not hyperactive.     There were no vitals taken for this visit.There is no height or weight on file to calculate BMI.  General Appearance: Unable to assess due to telemedicine visit  Eye Contact: Unable to assess due to telemedicine visit  Speech:  Clear and Coherent and Normal Rate  Volume:  Normal  Mood:  Anxious and Euthymic  Affect:  Appropriate and Congruent  Thought Process:  Coherent and Descriptions of Associations: Intact  Orientation:  Full (Time, Place, and Person)  Thought Content: WDL and Rumination   Suicidal Thoughts:  No  Homicidal Thoughts:  No  Memory:  Immediate;   Good Recent;   Good Remote;   Good  Judgement:  Good  Insight:  Good  Psychomotor Activity:  Normal  Concentration:  Concentration: Good and Attention Span: Good  Recall:  Good  Fund of Knowledge: Good  Language: Good  Akathisia:  NA  Handed:  Right  AIMS (if indicated): not done  Assets:  Communication Skills Desire for Improvement Financial Resources/Insurance Housing Resilience Social Support  ADL's:  Intact  Cognition: WNL  Sleep:  Good   Screenings: GAD-7   Flowsheet Row Video Visit from 02/20/2021 in [facility] Video Visit from 11/28/2020 in [facility] Office Visit from 04/28/2020 in [facility]  Total GAD-7 Score 6 8 12     PHQ2-9   Flowsheet Row Video Visit from 02/20/2021 in [facility] Video Visit from 11/28/2020 in [facility] Office Visit from 04/28/2020 in [facility]  PHQ-2 Total Score 5 3 6   PHQ-9 Total Score 9 6 12     Flowsheet Row Video Visit from 02/20/2021 in [facility] Video Visit from 11/28/2020 in [facility]  C-SSRS RISK CATEGORY No Risk No Risk      Assessment and Plan:   Kimberly Adkins is a 65 year old  female with a past psychiatric history significant for obsessive-compulsive disorder, PTSD, major depressive disorder and anxiety who presents to [facility] via virtual telephone visit for follow-up and medication management.  Patient reports no issues or concerns with her current regimen of medications.  Patient  denies a need for dosage adjustments at this time and is requesting refills on all her medications.  Patient's medications to be e-prescribed to pharmacy of choice.  1. Mild episode of recurrent major depressive disorder (HCC)  - FLUoxetine (PROZAC) 20 MG capsule; Take 3 capsules (60 mg total) by mouth daily.  Dispense: 90 capsule; Refill: 2  2. Anxiety state  - ALPRAZolam (XANAX) 1 MG tablet; Take 1 tablet (1 mg total) by mouth 3 (three) times daily as needed for anxiety.  Dispense: 90 tablet; Refill: 2 - FLUoxetine (PROZAC) 20 MG capsule; Take 3 capsules (60 mg total) by mouth daily.  Dispense: 90 capsule; Refill: 2  Patient to follow-up in 3 months  Meta Hatchet, PA 02/20/2021, 3:52 PM

## 2021-02-21 ENCOUNTER — Other Ambulatory Visit: Payer: Self-pay

## 2021-02-21 MED FILL — Atorvastatin Calcium Tab 40 MG (Base Equivalent): ORAL | 30 days supply | Qty: 30 | Fill #0 | Status: CN

## 2021-02-26 ENCOUNTER — Other Ambulatory Visit: Payer: Self-pay

## 2021-02-28 ENCOUNTER — Other Ambulatory Visit: Payer: Self-pay

## 2021-03-26 ENCOUNTER — Other Ambulatory Visit: Payer: Self-pay

## 2021-03-26 MED FILL — Amlodipine Besylate Tab 10 MG (Base Equivalent): ORAL | 30 days supply | Qty: 30 | Fill #0 | Status: CN

## 2021-03-26 MED FILL — Fluticasone-Salmeterol Aer Powder BA 100-50 MCG/ACT: RESPIRATORY_TRACT | 30 days supply | Qty: 60 | Fill #0 | Status: CN

## 2021-03-26 MED FILL — Losartan Potassium Tab 100 MG: ORAL | 30 days supply | Qty: 30 | Fill #0 | Status: CN

## 2021-03-26 MED FILL — Albuterol Sulfate Inhal Aero 108 MCG/ACT (90MCG Base Equiv): RESPIRATORY_TRACT | 25 days supply | Qty: 18 | Fill #0 | Status: CN

## 2021-03-26 MED FILL — Carvedilol Tab 3.125 MG: ORAL | 30 days supply | Qty: 60 | Fill #0 | Status: CN

## 2021-03-26 MED FILL — Atorvastatin Calcium Tab 40 MG (Base Equivalent): ORAL | 30 days supply | Qty: 30 | Fill #0 | Status: CN

## 2021-03-29 ENCOUNTER — Other Ambulatory Visit: Payer: Self-pay

## 2021-04-02 ENCOUNTER — Other Ambulatory Visit: Payer: Self-pay

## 2021-04-03 ENCOUNTER — Encounter: Payer: Self-pay | Admitting: Nurse Practitioner

## 2021-04-03 ENCOUNTER — Other Ambulatory Visit: Payer: Self-pay

## 2021-04-03 ENCOUNTER — Ambulatory Visit: Payer: Self-pay | Attending: Nurse Practitioner | Admitting: Nurse Practitioner

## 2021-04-03 DIAGNOSIS — I10 Essential (primary) hypertension: Secondary | ICD-10-CM

## 2021-04-03 DIAGNOSIS — R7303 Prediabetes: Secondary | ICD-10-CM

## 2021-04-03 NOTE — Progress Notes (Signed)
Virtual Visit via Telephone Note Due to national recommendations of social distancing due to COVID 19, telehealth visit is felt to be most appropriate for this patient at this time.  I discussed the limitations, risks, security and privacy concerns of performing an evaluation and management service by telephone and the availability of in person appointments. I also discussed with the patient that there may be a patient responsible charge related to this service. The patient expressed understanding and agreed to proceed.    I connected with [name] on 04/03/21  at   1:30 PM EDT  EDT by telephone and verified that I am speaking with the correct person using two identifiers.  Location of Patient: Private Residence   [facility] of Provider: [facility]    Persons participating in Telemedicine visit: Bertram Denver FNP-BC [name]    History of Present Illness: Telemedicine visit for: Follow up to HTN and prediabetes   Prediabetes Well controlled without any oral diabetic medications.  Lab Results  Component Value Date   HGBA1C 5.8 (H) 01/01/2021    HTN Slightly elevated. She states blood pressure readings at the local drug store are normal. She believes she may have white coat syndrome.  Endorses adherence taking amlodipine 10 mg daily, carvedilol 3.125 mg twice daily and losartan 100 mg daily. Denies chest pain, shortness of breath, palpitations, lightheadedness, dizziness, headaches or BLE edema.  Concerned that her antihypertensives could be causing her weight gain.  She is not very active and leads a sedentary lifestyle.  I have encouraged her to start walking daily to see if this will help with some weight loss.  I did make her aware that some SSRIs can cause weight gain as well. BP Readings from Last 3 Encounters:  01/01/21 (!) 144/82  07/12/20 129/82  06/16/20 (!) 155/95     Past Medical History:  Diagnosis Date   Anxiety     Depression    GERD (gastroesophageal reflux disease)    Hyperlipidemia    Hypertension    Osteoarthritis    PTSD (post-traumatic stress disorder) 2010    Past Surgical History:  Procedure Laterality Date   ANTERIOR AND POSTERIOR REPAIR  2007   BREAST BIOPSY  1997   right; benign   TUBAL LIGATION  1994    Family History  Problem Relation Age of Onset   Breast cancer Paternal Grandmother        Age 38   Colon polyps Paternal Grandmother 43   Colon polyps Maternal Grandfather 33   Cancer Mother        Breast cancer   Breast cancer Mother        Age 41   Coronary artery disease Father    Hypertension Father    Breast cancer Paternal Aunt        Age 67's   Cancer Maternal Grandmother        colon   Heart attack Daughter     Social History   Socioeconomic History   Marital status: Widowed    Spouse name: Not on file   Number of children: Not on file   Years of education: Not on file   Highest education level: Not on file  Occupational History   Occupation: unemplyed  Tobacco Use   Smoking status: Every Day    Packs/day: 1.50    Years: 22.00    Pack years: 33.00    Types: Cigarettes   Smokeless tobacco: Never  Vaping Use  Vaping Use: Never used  Substance and Sexual Activity   Alcohol use: Yes   Drug use: No   Sexual activity: Never    Birth control/protection: Post-menopausal  Other Topics Concern   Not on file  Social History Narrative   Not on file   Social Determinants of Health   Financial Resource Strain: Not on file  Food Insecurity: Not on file  Transportation Needs: Not on file  Physical Activity: Not on file  Stress: Not on file  Social Connections: Not on file     Observations/Objective: Awake, alert and oriented x 3   Review of Systems  Constitutional:  Negative for fever, malaise/fatigue and weight loss.  HENT: Negative.  Negative for nosebleeds.   Eyes: Negative.  Negative for blurred vision, double vision and photophobia.   Respiratory: Negative.  Negative for cough and shortness of breath.   Cardiovascular: Negative.  Negative for chest pain, palpitations and leg swelling.  Gastrointestinal: Negative.  Negative for heartburn, nausea and vomiting.  Musculoskeletal: Negative.  Negative for myalgias.  Neurological: Negative.  Negative for dizziness, focal weakness, seizures and headaches.  Psychiatric/Behavioral:  Positive for depression. Negative for suicidal ideas. The patient is nervous/anxious.    Assessment and Plan: Diagnoses and all orders for this visit:  Primary hypertension Continue all antihypertensives as prescribed.  Remember to bring in your blood pressure log with you for your follow up appointment.  DASH/Mediterranean Diets are healthier choices for HTN.    Prediabetes Continue blood sugar control as discussed in office today, low carbohydrate diet, and regular physical exercise as tolerated, 150 minutes per week (30 min each day, 5 days per week, or 50 min 3 days per week).     Follow Up Instructions Return in about 3 months (around 07/04/2021) for Patient was instructed to make her 33-month appointment when she comes in today for medication refill.     I discussed the assessment and treatment plan with the patient. The patient was provided an opportunity to ask questions and all were answered. The patient agreed with the plan and demonstrated an understanding of the instructions.   The patient was advised to call back or seek an in-person evaluation if the symptoms worsen or if the condition fails to improve as anticipated.  I provided 10 minutes of non-face-to-face time during this encounter including median intraservice time, reviewing previous notes, labs, imaging, medications and explaining diagnosis and management.  [name], FNP-BC

## 2021-04-05 ENCOUNTER — Other Ambulatory Visit: Payer: Self-pay

## 2021-04-05 MED FILL — Losartan Potassium Tab 100 MG: ORAL | 30 days supply | Qty: 30 | Fill #0 | Status: AC

## 2021-04-05 MED FILL — Amlodipine Besylate Tab 10 MG (Base Equivalent): ORAL | 30 days supply | Qty: 30 | Fill #0 | Status: AC

## 2021-04-05 MED FILL — Fluticasone-Salmeterol Aer Powder BA 100-50 MCG/ACT: RESPIRATORY_TRACT | 30 days supply | Qty: 60 | Fill #0 | Status: AC

## 2021-04-05 MED FILL — Albuterol Sulfate Inhal Aero 108 MCG/ACT (90MCG Base Equiv): RESPIRATORY_TRACT | 25 days supply | Qty: 18 | Fill #0 | Status: AC

## 2021-04-05 MED FILL — Carvedilol Tab 3.125 MG: ORAL | 30 days supply | Qty: 60 | Fill #0 | Status: AC

## 2021-04-05 MED FILL — Atorvastatin Calcium Tab 40 MG (Base Equivalent): ORAL | 30 days supply | Qty: 30 | Fill #0 | Status: AC

## 2021-04-06 ENCOUNTER — Other Ambulatory Visit: Payer: Self-pay

## 2021-04-09 ENCOUNTER — Other Ambulatory Visit: Payer: Self-pay

## 2021-05-23 ENCOUNTER — Telehealth (INDEPENDENT_AMBULATORY_CARE_PROVIDER_SITE_OTHER): Payer: No Payment, Other | Admitting: Physician Assistant

## 2021-05-23 ENCOUNTER — Other Ambulatory Visit: Payer: Self-pay

## 2021-05-23 DIAGNOSIS — F330 Major depressive disorder, recurrent, mild: Secondary | ICD-10-CM

## 2021-05-23 DIAGNOSIS — F411 Generalized anxiety disorder: Secondary | ICD-10-CM | POA: Diagnosis not present
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
## 2021-05-26 ENCOUNTER — Encounter (HOSPITAL_COMMUNITY): Payer: Self-pay | Admitting: Physician Assistant

## 2021-05-26 MED ORDER — ALPRAZOLAM 1 MG PO TABS: 1.0000 mg | ORAL_TABLET | Freq: Three times a day (TID) | ORAL | 2 refills | Status: DC | PRN

## 2021-05-26 MED ORDER — FLUOXETINE HCL 20 MG PO CAPS: 60.0000 mg | ORAL_CAPSULE | Freq: Every day | ORAL | 2 refills | Status: DC

## 2021-05-29 ENCOUNTER — Other Ambulatory Visit: Payer: Self-pay

## 2021-05-29 MED FILL — Amlodipine Besylate Tab 10 MG (Base Equivalent): ORAL | 30 days supply | Qty: 30 | Fill #1 | Status: AC

## 2021-05-29 MED FILL — Atorvastatin Calcium Tab 40 MG (Base Equivalent): ORAL | 30 days supply | Qty: 30 | Fill #1 | Status: AC

## 2021-05-29 MED FILL — Meloxicam Tab 7.5 MG: ORAL | 30 days supply | Qty: 30 | Fill #0 | Status: AC

## 2021-05-29 MED FILL — Carvedilol Tab 3.125 MG: ORAL | 30 days supply | Qty: 60 | Fill #1 | Status: AC

## 2021-05-29 MED FILL — Losartan Potassium Tab 100 MG: ORAL | 30 days supply | Qty: 30 | Fill #1 | Status: AC

## 2021-05-30 ENCOUNTER — Other Ambulatory Visit: Payer: Self-pay

## 2021-06-19 ENCOUNTER — Ambulatory Visit: Payer: Self-pay | Admitting: Nurse Practitioner

## 2021-08-01 ENCOUNTER — Other Ambulatory Visit: Payer: Self-pay

## 2021-08-01 ENCOUNTER — Encounter: Payer: Self-pay | Admitting: Nurse Practitioner

## 2021-08-01 ENCOUNTER — Telehealth: Payer: Self-pay | Admitting: Nurse Practitioner

## 2021-08-01 ENCOUNTER — Ambulatory Visit: Payer: Self-pay | Attending: Nurse Practitioner | Admitting: Nurse Practitioner

## 2021-08-01 DIAGNOSIS — E785 Hyperlipidemia, unspecified: Secondary | ICD-10-CM

## 2021-08-01 DIAGNOSIS — R7303 Prediabetes: Secondary | ICD-10-CM

## 2021-08-01 DIAGNOSIS — I10 Essential (primary) hypertension: Secondary | ICD-10-CM

## 2021-08-01 MED FILL — Fluticasone-Salmeterol Aer Powder BA 100-50 MCG/ACT: RESPIRATORY_TRACT | 30 days supply | Qty: 60 | Fill #1 | Status: AC

## 2021-08-01 MED FILL — Atorvastatin Calcium Tab 40 MG (Base Equivalent): ORAL | 30 days supply | Qty: 30 | Fill #2 | Status: AC

## 2021-08-01 MED FILL — Amlodipine Besylate Tab 10 MG (Base Equivalent): ORAL | 30 days supply | Qty: 30 | Fill #2 | Status: AC

## 2021-08-01 MED FILL — Losartan Potassium Tab 100 MG: ORAL | 30 days supply | Qty: 30 | Fill #2 | Status: AC

## 2021-08-01 MED FILL — Meloxicam Tab 7.5 MG: ORAL | 30 days supply | Qty: 30 | Fill #1 | Status: AC

## 2021-08-01 MED FILL — Carvedilol Tab 3.125 MG: ORAL | 30 days supply | Qty: 60 | Fill #2 | Status: AC

## 2021-08-01 NOTE — Telephone Encounter (Signed)
No answer. LVM ?

## 2021-08-01 NOTE — Progress Notes (Signed)
Virtual Visit via Telephone Note Due to national recommendations of social distancing due to COVID 19, telehealth visit is felt to be most appropriate for this patient at this time.  I discussed the limitations, risks, security and privacy concerns of performing an evaluation and management service by telephone and the availability of in person appointments. I also discussed with the patient that there may be a patient responsible charge related to this service. The patient expressed understanding and agreed to proceed.    I connected with Kimberly Adkins on 08/01/21  at   1:30 PM EDT  EDT by telephone and verified that I am speaking with the correct person using two identifiers.  Location of Patient: Private Residence   [facility] of Provider: [facility]    Persons participating in Telemedicine visit: Bertram Denver FNP-BC Kimberly Adkins    History of Present Illness: Telemedicine visit for: HTN I have not seen her in the office since March. 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   She has a past medical history of Anxiety, Depression, GERD (gastroesophageal reflux disease), Hyperlipidemia, Hypertension, Osteoarthritis, and PTSD (post-traumatic stress disorder) (2010). Patient has been counseled on age-appropriate routine health concerns for screening and prevention. These are reviewed and up-to-date. Referrals have been placed accordingly. Immunizations are up-to-date or declined.    MAMMOGRAM: She has been referred to [facility].  PAP Smear: Declines She sees psychiatry for anxiety and depression. Taking prozac and xanax as prescribed.  Patient was urged to apply for the financial assistance program.  They were instructed to inquire at the front desk about the application process for the [place] discount, orange card or other  financial assistance.       HTN She is not monitoring her blood pressure at home. She endorses adherence taking carvedilol 3.125 mg BID, losartan 100 mg daily and amlodipine 10 mg daily.  BP Readings from Last 3 Encounters:  01/01/21 (!) 144/82  07/12/20 129/82  06/16/20 (!) 155/95    Prediabetes  She is currently not taking any medications for glucose lowering. LDL not at gaol with atorvastatin 40 mg daily.  Lab Results  Component Value Date   HGBA1C 5.8 (H) 01/01/2021    Lab Results  Component Value Date   LDLCALC 104 (H) 01/01/2021       Past Medical History:  Diagnosis Date   Anxiety    Depression    GERD (gastroesophageal reflux disease)    Hyperlipidemia    Hypertension    Osteoarthritis    PTSD (post-traumatic stress disorder) 2010    Past Surgical History:  Procedure Laterality Date   ANTERIOR AND POSTERIOR REPAIR  2007   BREAST BIOPSY  1997   right; benign   TUBAL LIGATION  1994    Family History  Problem Relation Age of Onset   Breast cancer Paternal Grandmother        Age 57   Colon polyps Paternal Grandmother 39   Colon polyps Maternal Grandfather 47   Cancer Mother        Breast cancer   Breast cancer Mother        Age 11   Coronary artery disease Father    Hypertension Father    Breast cancer Paternal Aunt        Age 81's   Cancer Maternal Grandmother  colon   Heart attack Daughter     Social History   Socioeconomic History   Marital status: Widowed    Spouse name: Not on file   Number of children: Not on file   Years of education: Not on file   Highest education level: Not on file  Occupational History   Occupation: unemplyed  Tobacco Use   Smoking status: Every Day    Packs/day: 1.50    Years: 22.00    Pack years: 33.00    Types: Cigarettes   Smokeless tobacco: Never  Vaping Use   Vaping Use: Never used  Substance and Sexual Activity   Alcohol use: Yes   Drug use: No   Sexual activity: Never    Birth  control/protection: Post-menopausal  Other Topics Concern   Not on file  Social History Narrative   Not on file   Social Determinants of Health   Financial Resource Strain: Not on file  Food Insecurity: Not on file  Transportation Needs: Not on file  Physical Activity: Not on file  Stress: Not on file  Social Connections: Not on file     Observations/Objective: Awake, alert and oriented x 3   Review of Systems  Constitutional:  Negative for fever, malaise/fatigue and weight loss.  HENT: Negative.  Negative for nosebleeds.   Eyes: Negative.  Negative for blurred vision, double vision and photophobia.  Respiratory: Negative.  Negative for cough and shortness of breath.   Cardiovascular:  Negative for chest pain, palpitations (tele) and leg swelling.  Gastrointestinal: Negative.  Negative for heartburn, nausea and vomiting.  Musculoskeletal: Negative.  Negative for myalgias.  Neurological: Negative.  Negative for dizziness, focal weakness, seizures and headaches.  Psychiatric/Behavioral: Negative.  Negative for suicidal ideas.    Assessment and Plan: Diagnoses and all orders for this visit:  Primary hypertension Continue all antihypertensives as prescribed.  Remember to bring in your blood pressure log with you for your follow up appointment.  DASH/Mediterranean Diets are healthier choices for HTN.    Prediabetes Continue blood sugar control as discussed in office today, low carbohydrate diet, and regular physical exercise as tolerated, 150 minutes per week (30 min each day, 5 days per week, or 50 min 3 days per week).   Dyslipidemia INSTRUCTIONS: Work on a low fat, heart healthy diet and participate in regular aerobic exercise program by working out at least 150 minutes per week; 5 days a week-30 minutes per day. Avoid red meat/beef/steak,  fried foods. junk foods, sodas, sugary drinks, unhealthy snacking, alcohol and smoking.  Drink at least 80 oz of water per day and monitor  your carbohydrate intake daily.      Follow Up Instructions Return in about 4 weeks (around 08/29/2021) for HTN/Prediabetes/HPL.     I discussed the assessment and treatment plan with the patient. The patient was provided an opportunity to ask questions and all were answered. The patient agreed with the plan and demonstrated an understanding of the instructions.   The patient was advised to call back or seek an in-person evaluation if the symptoms worsen or if the condition fails to improve as anticipated.  I provided 8 minutes of non-face-to-face time during this encounter including median intraservice time, reviewing previous notes, labs, imaging, medications and explaining diagnosis and management.  [name], FNP-BC

## 2021-08-02 ENCOUNTER — Other Ambulatory Visit: Payer: Self-pay

## 2021-08-23 ENCOUNTER — Telehealth (INDEPENDENT_AMBULATORY_CARE_PROVIDER_SITE_OTHER): Payer: No Payment, Other | Admitting: Physician Assistant

## 2021-08-23 DIAGNOSIS — F411 Generalized anxiety disorder: Secondary | ICD-10-CM | POA: Diagnosis not present

## 2021-08-23 DIAGNOSIS — F330 Major depressive disorder, recurrent, mild: Secondary | ICD-10-CM | POA: Diagnosis not present
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
## 2021-08-24 ENCOUNTER — Encounter (HOSPITAL_COMMUNITY): Payer: Self-pay | Admitting: Physician Assistant

## 2021-08-24 MED ORDER — FLUOXETINE HCL 20 MG PO CAPS: 60.0000 mg | ORAL_CAPSULE | Freq: Every day | ORAL | 2 refills | Status: DC

## 2021-08-24 MED ORDER — ALPRAZOLAM 1 MG PO TABS: 1.0000 mg | ORAL_TABLET | Freq: Three times a day (TID) | ORAL | 2 refills | Status: DC | PRN

## 2021-09-19 ENCOUNTER — Ambulatory Visit: Payer: Self-pay | Admitting: Nurse Practitioner

## 2021-11-22 ENCOUNTER — Telehealth (INDEPENDENT_AMBULATORY_CARE_PROVIDER_SITE_OTHER): Payer: No Payment, Other | Admitting: Physician Assistant

## 2021-11-22 ENCOUNTER — Encounter (HOSPITAL_COMMUNITY): Payer: Self-pay | Admitting: Physician Assistant

## 2021-11-22 DIAGNOSIS — F411 Generalized anxiety disorder: Secondary | ICD-10-CM

## 2021-11-22 DIAGNOSIS — F330 Major depressive disorder, recurrent, mild: Secondary | ICD-10-CM | POA: Diagnosis not present

## 2021-11-22 MED ORDER — ALPRAZOLAM 1 MG PO TABS: 1.0000 mg | ORAL_TABLET | Freq: Three times a day (TID) | ORAL | 1 refills | Status: DC | PRN

## 2021-11-22 MED ORDER — FLUOXETINE HCL 20 MG PO CAPS: 60.0000 mg | ORAL_CAPSULE | Freq: Every day | ORAL | 1 refills | Status: DC
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
## 2022-02-02 ENCOUNTER — Other Ambulatory Visit (HOSPITAL_COMMUNITY): Payer: Self-pay | Admitting: Physician Assistant

## 2022-02-02 DIAGNOSIS — F330 Major depressive disorder, recurrent, mild: Secondary | ICD-10-CM

## 2022-02-02 DIAGNOSIS — F411 Generalized anxiety disorder: Secondary | ICD-10-CM

## 2022-02-04 ENCOUNTER — Telehealth: Payer: Self-pay | Admitting: *Deleted

## 2022-02-04 NOTE — Telephone Encounter (Signed)
REFILL REQUEST ?  ?ALPRAZolam (XANAX) 1 MG tablet ? ?FLUoxetine (PROZAC) 20 MG capsule ?

## 2022-02-04 NOTE — Telephone Encounter (Signed)
Provider was contacted by Direce E McIntyre, RMA regarding patient's medication refill. Patient's medication to be e-prescribed to pharmacy of choice.

## 2022-02-21 ENCOUNTER — Telehealth (INDEPENDENT_AMBULATORY_CARE_PROVIDER_SITE_OTHER): Payer: No Payment, Other | Admitting: Physician Assistant

## 2022-02-21 ENCOUNTER — Encounter (HOSPITAL_COMMUNITY): Payer: Self-pay | Admitting: Physician Assistant

## 2022-02-21 DIAGNOSIS — F330 Major depressive disorder, recurrent, mild: Secondary | ICD-10-CM | POA: Diagnosis not present

## 2022-02-21 DIAGNOSIS — F411 Generalized anxiety disorder: Secondary | ICD-10-CM

## 2022-02-21 MED ORDER — ALPRAZOLAM 1 MG PO TABS: 1.0000 mg | ORAL_TABLET | Freq: Three times a day (TID) | ORAL | 0 refills | Status: DC | PRN

## 2022-02-21 MED ORDER — FLUOXETINE HCL 20 MG PO CAPS: 60.0000 mg | ORAL_CAPSULE | Freq: Every day | ORAL | 3 refills | Status: DC
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
## 2022-04-04 ENCOUNTER — Other Ambulatory Visit (HOSPITAL_COMMUNITY): Payer: Self-pay | Admitting: Physician Assistant

## 2022-04-04 DIAGNOSIS — F411 Generalized anxiety disorder: Secondary | ICD-10-CM

## 2022-04-05 ENCOUNTER — Telehealth (HOSPITAL_COMMUNITY): Payer: Self-pay | Admitting: *Deleted

## 2022-04-09 ENCOUNTER — Telehealth (HOSPITAL_COMMUNITY): Payer: Self-pay | Admitting: *Deleted

## 2022-04-09 ENCOUNTER — Other Ambulatory Visit (HOSPITAL_COMMUNITY): Payer: Self-pay | Admitting: Psychiatry

## 2022-04-09 DIAGNOSIS — F411 Generalized anxiety disorder: Secondary | ICD-10-CM

## 2022-04-09 MED ORDER — ALPRAZOLAM 1 MG PO TABS: 1.0000 mg | ORAL_TABLET | Freq: Three times a day (TID) | ORAL | 0 refills | Status: DC | PRN

## 2022-05-09 ENCOUNTER — Other Ambulatory Visit (HOSPITAL_COMMUNITY): Payer: Self-pay | Admitting: Physician Assistant

## 2022-05-09 DIAGNOSIS — F411 Generalized anxiety disorder: Secondary | ICD-10-CM

## 2022-05-10 ENCOUNTER — Other Ambulatory Visit (HOSPITAL_COMMUNITY): Payer: Self-pay | Admitting: Student in an Organized Health Care Education/Training Program

## 2022-05-10 ENCOUNTER — Other Ambulatory Visit: Payer: Self-pay

## 2022-05-10 ENCOUNTER — Telehealth (HOSPITAL_COMMUNITY): Payer: Self-pay

## 2022-05-10 DIAGNOSIS — F411 Generalized anxiety disorder: Secondary | ICD-10-CM

## 2022-05-10 MED ORDER — ALPRAZOLAM 1 MG PO TABS
1.0000 mg | ORAL_TABLET | Freq: Three times a day (TID) | ORAL | 0 refills | Status: DC | PRN
  Filled 2022-05-10: qty 90, 30d supply, fill #0

## 2022-05-10 NOTE — Progress Notes (Signed)
1 time refill for Xanax 1mg  TID PRN, placed. Patient PDMP shows patient is likely out of medication. Patient has upcoming appt scheduled.  PGY-3 , MD

## 2022-05-10 NOTE — Telephone Encounter (Signed)
Medication refill request - Telephone call with patient, after she left a message she is in need of a new Alprazolam order, last prescribed 04/09/22.  Patient reported she is still staying with her daughter at present and again needs this order to be sent to the [facility] in [place] on [address].  Patient to return for next visit on 05/30/22.

## 2022-05-13 ENCOUNTER — Other Ambulatory Visit: Payer: Self-pay

## 2022-05-14 ENCOUNTER — Other Ambulatory Visit: Payer: Self-pay

## 2022-05-15 ENCOUNTER — Other Ambulatory Visit: Payer: Self-pay

## 2022-05-30 ENCOUNTER — Encounter (HOSPITAL_COMMUNITY): Payer: Self-pay | Admitting: Physician Assistant

## 2022-05-30 ENCOUNTER — Telehealth (INDEPENDENT_AMBULATORY_CARE_PROVIDER_SITE_OTHER): Payer: No Payment, Other | Admitting: Physician Assistant

## 2022-05-30 DIAGNOSIS — F330 Major depressive disorder, recurrent, mild: Secondary | ICD-10-CM

## 2022-05-30 DIAGNOSIS — F411 Generalized anxiety disorder: Secondary | ICD-10-CM

## 2022-05-30 MED ORDER — ALPRAZOLAM 1 MG PO TABS: 1.0000 mg | ORAL_TABLET | Freq: Three times a day (TID) | ORAL | 1 refills | Status: DC | PRN

## 2022-05-30 MED ORDER — FLUOXETINE HCL 20 MG PO CAPS: 60.0000 mg | ORAL_CAPSULE | Freq: Every day | ORAL | 3 refills | Status: DC
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
## 2022-08-12 ENCOUNTER — Telehealth (HOSPITAL_COMMUNITY): Payer: Self-pay | Admitting: *Deleted

## 2022-08-12 ENCOUNTER — Other Ambulatory Visit (HOSPITAL_COMMUNITY): Payer: Self-pay | Admitting: Physician Assistant

## 2022-08-12 DIAGNOSIS — F411 Generalized anxiety disorder: Secondary | ICD-10-CM

## 2022-08-12 MED ORDER — ALPRAZOLAM 1 MG PO TABS: 1.0000 mg | ORAL_TABLET | Freq: Three times a day (TID) | ORAL | 0 refills | Status: DC | PRN

## 2022-08-12 NOTE — Progress Notes (Signed)
Provider was contacted by [facility] regarding patient's medication refill.  Patient's medication to be prescribed to pharmacy of choice.  Patient has a follow-up appointment scheduled for 08/29/2022 with Andrew Ji, MD.

## 2022-08-12 NOTE — Telephone Encounter (Signed)
Provider was contacted by Eliezer Lofts regarding patient's medication refill.  Patient's medication to be prescribed to pharmacy of choice.  Patient has a follow-up appointment scheduled for 08/29/2022 with France Ravens, MD.

## 2022-08-12 NOTE — Telephone Encounter (Signed)
Patient Called Requesting Refill -- ALPRAZolam (XANAX) 1 MG tablet Take 1 tablet (1 mg total) by mouth  3 (three) times daily as needed. for anxiety.    Appt currently scheduled  08/29/22 with Trinna Post

## 2022-08-12 NOTE — Telephone Encounter (Signed)
Patient Called Requesting Refill -- ALPRAZolam (XANAX) 1 MG tablet Take 1 tablet (1 mg total) by mouth  3 (three) times daily as needed. for anxiety.    Appt currently scheduled  08/29/22 with Eddie Nwoko 

## 2022-08-29 ENCOUNTER — Encounter (HOSPITAL_COMMUNITY): Payer: Self-pay | Admitting: Student

## 2022-08-29 ENCOUNTER — Encounter (HOSPITAL_COMMUNITY): Payer: Self-pay

## 2022-08-29 ENCOUNTER — Telehealth (HOSPITAL_COMMUNITY): Payer: No Payment, Other | Admitting: Physician Assistant

## 2022-08-29 ENCOUNTER — Encounter (INDEPENDENT_AMBULATORY_CARE_PROVIDER_SITE_OTHER): Payer: Self-pay | Admitting: Student

## 2022-08-29 DIAGNOSIS — F1320 Sedative, hypnotic or anxiolytic dependence, uncomplicated: Secondary | ICD-10-CM

## 2022-08-29 DIAGNOSIS — F1090 Alcohol use, unspecified, uncomplicated: Secondary | ICD-10-CM

## 2022-08-29 DIAGNOSIS — F330 Major depressive disorder, recurrent, mild: Secondary | ICD-10-CM

## 2022-08-29 DIAGNOSIS — F17200 Nicotine dependence, unspecified, uncomplicated: Secondary | ICD-10-CM

## 2022-08-29 NOTE — Progress Notes (Deleted)
[place] MD Outpatient Progress Note  08/29/2022 11:20 AM Kimberly Adkins  MRN:  [id number]  Assessment:  Kimberly Adkins presents for follow-up evaluation. Patient was last seen by Kimberly Adkins 3 months ago. Current medication regiment is Alprazolam 1 mg three times daily as needed and fluoxetine 60 mg daily.  Today, 08/29/22, patient reports ***  Identifying Information: Kimberly Adkins is a 66 year old female with a past psychiatric history significant for anxiety and major depressive disorder who presents to [facility] via virtual telephone visit for follow-up and medication management.    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   Subjective:  Chief Complaint: No chief complaint on file.   Interval History: ***  Visit Diagnosis: No diagnosis found.  Past Psychiatric History:  Diagnoses: *** Medication trials: *** Previous psychiatrist/therapist: *** Hospitalizations: *** Suicide attempts: *** SIB: *** Hx of violence towards others: *** Current access to guns: *** Hx of abuse: *** Substance use: ***  Past Medical History:  Past Medical History:  Diagnosis Date   Anxiety    Depression    GERD (gastroesophageal reflux disease)    Hyperlipidemia    Hypertension    Osteoarthritis    PTSD (post-traumatic stress disorder) 2010    Past Surgical History:  Procedure Laterality Date   ANTERIOR AND POSTERIOR REPAIR  2007   BREAST BIOPSY  1997   right; benign   TUBAL LIGATION  1994     Family Psychiatric History: ***  Family History:  Family History  Problem Relation Age of Onset   Breast cancer Paternal Grandmother        Age 20   Colon polyps Paternal Grandmother 28   Colon polyps Maternal Grandfather 67   Cancer Mother        Breast cancer   Breast cancer Mother        Age 10   Coronary artery disease Father    Hypertension Father    Breast cancer Paternal Aunt        Age 72's   Cancer Maternal Grandmother        colon   Heart attack Daughter     Social History:  Social History   Socioeconomic History   Marital status: Widowed    Spouse name: Not on file   Number of children: Not on file   Years of education: Not on file   Highest education level: Not on file  Occupational History   Occupation: unemplyed  Tobacco Use   Smoking status: Every Day    Packs/day: 1.50    Years: 22.00    Total pack years: 33.00    Types: Cigarettes   Smokeless tobacco: Never  Vaping Use   Vaping Use: Never used  Substance and Sexual Activity   Alcohol use: Yes   Drug use: No   Sexual activity: Never    Birth control/protection: Post-menopausal  Other Topics Concern   Not on file  Social History Narrative   Not on file   Social Determinants of Health   Financial Resource Strain: Not on file  Food Insecurity: Not on file  Transportation Needs: Not on file  Physical Activity: Not on file  Stress: Not on file  Social Connections: Not on file    Allergies:  Allergies  Allergen Reactions   Hydromorphone Itching    Current Medications: Current Outpatient Medications  Medication Sig Dispense Refill   acetaminophen (TYLENOL) 500 MG tablet Take 1,000 mg by mouth every 4 (four) hours as needed for mild pain.     albuterol (VENTOLIN HFA) 108 (90 Base) MCG/ACT inhaler INHALE 2 PUFFS INTO THE LUNGS EVERY 6 (SIX) HOURS AS NEEDED FOR WHEEZING OR SHORTNESS OF BREATH. 18 g 1   ALPRAZolam (XANAX) 1 MG tablet Take 1 tablet (1 mg total) by mouth 3 (three)  times daily as needed. for anxiety 90 tablet 0   amLODipine (NORVASC) 10 MG tablet TAKE 1 TABLET (10 MG TOTAL) BY MOUTH DAILY. 90 tablet 1   atorvastatin (LIPITOR) 40 MG tablet TAKE 1 TABLET (40 MG TOTAL) BY MOUTH DAILY. 90 tablet 3   carvedilol (COREG) 3.125 MG tablet TAKE 1 TABLET (3.125 MG TOTAL) BY MOUTH 2 (TWO) TIMES DAILY WITH A MEAL. 180 tablet 1   diphenhydrAMINE (BENADRYL) 25 mg capsule Take 25 mg by mouth at bedtime.     FLUoxetine (PROZAC) 20 MG capsule Take 3 capsules (60 mg total) by mouth daily. 90 capsule 3   fluticasone-salmeterol (ADVAIR) 100-50 MCG/ACT AEPB Inhale 1 puff into the lungs 2 (two) times daily. 60 each 6   ibuprofen (ADVIL,MOTRIN) 200 MG tablet Take 400 mg by mouth every 4 (four) hours as needed for headache or mild pain.     losartan (COZAAR) 100 MG tablet TAKE 1 TABLET (100 MG TOTAL) BY MOUTH DAILY. 90 tablet 1   No current facility-administered medications for this visit.    ROS: Review of Systems  Objective:  Psychiatric Specialty Exam: There were no vitals taken for this visit.There is no height or weight on file to calculate BMI.  General Appearance: {Appearance:22683}  Eye Contact:  {BHH EYE CONTACT:22684}  Speech:  {Speech:22685}  Volume:  {Volume (PAA):22686}  Mood:  {BHH MOOD:22306}  Affect:  {Affect (PAA):22687}  Thought Content: {Thought Content:22690}   Suicidal Thoughts:  {ST/HT (PAA):22692}  Homicidal Thoughts:  {ST/HT (PAA):22692}  Thought Process:  {Thought Process (PAA):22688}  Orientation:  {BHH ORIENTATION (PAA):22689}    Memory:  {BHH MEMORY:22881}  Judgment:  {Judgement (PAA):22694}  Insight:  {Insight (PAA):22695}  Concentration:  {Concentration:21399}  Recall:  {BHH GOOD/FAIR/POOR:22877}  Fund of Knowledge: {BHH GOOD/FAIR/POOR:22877}  Language: {BHH GOOD/FAIR/POOR:22877}  Psychomotor Activity:  {Psychomotor (PAA):22696}  Akathisia:  {BHH YES OR NO:22294}  AIMS (if indicated): {Desc; done/not:10129}  Assets:  {Assets  (PAA):22698}  ADL's:  {BHH XO:4411959  Cognition: {chl bhh cognition:304700322}  Sleep:  {BHH GOOD/FAIR/POOR:22877}   PE: General: sits comfortably in view of camera; no acute distress *** Pulm: no increased work of breathing on room air *** MSK: all extremity movements appear intact *** Neuro: no focal neurological deficits observed *** Gait & Station: unable to assess by video ***   Metabolic Disorder Labs: Lab Results  Component Value Date   HGBA1C 5.8 (H) 01/01/2021   No results found for: "PROLACTIN" Lab Results  Component Value Date   CHOL 170 01/01/2021   TRIG 180 (H) 01/01/2021   HDL 34 (L) 01/01/2021   CHOLHDL 5.0 (H) 01/01/2021   VLDL 39.2 04/08/2013   LDLCALC 104 (H) 01/01/2021   LDLCALC 182 (H) 02/07/2020   Lab Results  Component Value Date  TSH 1.17 04/08/2013   TSH 1.69 01/22/2012    Therapeutic Level Labs: No results found for: "LITHIUM" No results found for: "VALPROATE" No results found for: "CBMZ"  Screenings:  GAD-7    Flowsheet Row Video [facility]/30/2022 in [facility] Video Visit from 02/21/2022 in [facility] Video Visit from 11/22/2021 in [facility] Video Visit from 08/23/2021 in [facility] Video Visit from 05/23/2021 in [facility]  Total GAD-7 Score 10 11 8 12 12       PHQ2-9    Flowsheet Row Video Visit from 05/30/2022 in [facility] Video Visit from 02/21/2022 in [facility] Video Visit from 11/22/2021 in [facility] Video Visit from 08/23/2021 in [facility] Video Visit from 05/23/2021 in [facility]  PHQ-2 Total Score 6 2 1 5 3   PHQ-9 Total Score 12 5 -- 8 9      Flowsheet Row Video Visit from 05/30/2022 in [facility]  Video Visit from 02/21/2022 in [facility] Video Visit from 11/22/2021 in [facility]  C-SSRS RISK CATEGORY No Risk No Risk No Risk       Collaboration of Care: Collaboration of Care: [facility] OP Collaboration of [facility]  Patient/Guardian was advised Release of Information must be obtained prior to any record release in order to collaborate their care with an outside provider. Patient/Guardian was advised if they have not already done so to contact the registration department to sign all necessary forms in order for [facility] to release information regarding their care.   Consent: Patient/Guardian gives verbal consent for treatment and assignment of benefits for services provided during this visit. Patient/Guardian expressed understanding and agreed to proceed.   Televisit via video: I connected with JJHE:[id number]} on 08/29/22 at  2:00 PM EST by a video enabled telemedicine application and verified that I am speaking with the correct person using two identifiers.  Location: Patient: *** Provider: Office   I discussed the limitations of evaluation and management by telemedicine and the availability of in person appointments. The patient expressed understanding and agreed to proceed.  I discussed the assessment and treatment plan with the patient. The patient was provided an opportunity to ask questions and all were answered. The patient agreed with the plan and demonstrated an understanding of the instructions.   The patient was advised to call back or seek an in-person evaluation if the symptoms worsen or if the condition fails to improve as anticipated.  I provided *** minutes of non-face-to-face time during this encounter.  [name], MD 08/29/2022, 11:20 AM

## 2022-09-01 NOTE — Progress Notes (Signed)
Patient no showed. Encounter opened in error.

## 2022-09-12 ENCOUNTER — Other Ambulatory Visit (HOSPITAL_COMMUNITY): Payer: Self-pay | Admitting: Physician Assistant

## 2022-09-12 DIAGNOSIS — F411 Generalized anxiety disorder: Secondary | ICD-10-CM

## 2022-09-13 ENCOUNTER — Ambulatory Visit (INDEPENDENT_AMBULATORY_CARE_PROVIDER_SITE_OTHER): Payer: No Payment, Other | Admitting: Psychiatry

## 2022-09-13 DIAGNOSIS — F411 Generalized anxiety disorder: Secondary | ICD-10-CM

## 2022-09-13 MED ORDER — ALPRAZOLAM 1 MG PO TABS: 1.0000 mg | ORAL_TABLET | Freq: Three times a day (TID) | ORAL | 0 refills | Status: DC | PRN
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
## 2022-10-01 ENCOUNTER — Telehealth (HOSPITAL_COMMUNITY): Payer: Self-pay | Admitting: Student

## 2022-10-10 ENCOUNTER — Telehealth (HOSPITAL_COMMUNITY): Payer: No Payment, Other | Admitting: Student

## 2022-10-11 ENCOUNTER — Telehealth (HOSPITAL_COMMUNITY): Payer: Self-pay | Admitting: Student

## 2022-10-11 MED ORDER — ALPRAZOLAM 1 MG PO TABS: 1.0000 mg | ORAL_TABLET | Freq: Three times a day (TID) | ORAL | 0 refills | Status: DC | PRN

## 2022-10-11 NOTE — Addendum Note (Signed)
Addended byKarsten Ro on: 10/11/2022 01:55 PM   Modules accepted: Orders

## 2022-10-11 NOTE — Telephone Encounter (Addendum)
Received request for refill of patients Xanax.  When last seen in the clinic on 12/1 she received a one month refill at that time and per PDMP filled that prescription on 12/1.  Since Dr. Hazle Quant is not in office this week and this provider has not seen this patient will have staff instruct patient to call back next week or go to the nearest ED for withdrawal.   Arna Snipe MD Resident   30 days prescription of Xanax 1 mg TID [facility]. Resident checked PDMP and patient last refilled on 09/13/22.   Karsten Ro, MD PGY3 Psychiatry Resident  [facility]

## 2022-10-16 ENCOUNTER — Telehealth (HOSPITAL_COMMUNITY): Payer: Self-pay

## 2022-11-06 ENCOUNTER — Telehealth (HOSPITAL_COMMUNITY): Payer: Self-pay | Admitting: Student

## 2022-11-06 MED ORDER — ALPRAZOLAM 1 MG PO TABS: 1.0000 mg | ORAL_TABLET | Freq: Three times a day (TID) | ORAL | 0 refills | Status: DC | PRN

## 2022-11-06 NOTE — Telephone Encounter (Signed)
Patient called in requesting that medication be refilled,and requesting that it be sent to a pharmacy in [place] [address]. She is currenPatient called in requesting that medication be refilled,and requesting that it be sent to a pharmacy in [place] [address]. She is current running low and wont have any medication that will last until her follow up or this weekend. Patient states that if she off her medication she is afraid that she will have a panic attack running low and wont have any medication that will last until her follow up or this weekend. Patient states that if she off her medication she is afraid that she will have a panic attack

## 2022-11-06 NOTE — Addendum Note (Signed)
Addended byArmando Reichert on: 11/06/2022 02:32 PM   Modules accepted: Orders

## 2022-11-08 NOTE — Telephone Encounter (Signed)
Pt called again asking for refill on Aprazolam. She states she is almost out.

## 2022-11-14 ENCOUNTER — Ambulatory Visit (INDEPENDENT_AMBULATORY_CARE_PROVIDER_SITE_OTHER): Payer: No Payment, Other | Admitting: Student

## 2022-11-14 VITALS — BP 141/116 | HR 101

## 2022-11-14 DIAGNOSIS — F411 Generalized anxiety disorder: Secondary | ICD-10-CM

## 2022-11-14 DIAGNOSIS — F1320 Sedative, hypnotic or anxiolytic dependence, uncomplicated: Secondary | ICD-10-CM | POA: Diagnosis not present

## 2022-11-14 DIAGNOSIS — I10 Essential (primary) hypertension: Secondary | ICD-10-CM

## 2022-11-14 DIAGNOSIS — F330 Major depressive disorder, recurrent, mild: Secondary | ICD-10-CM | POA: Diagnosis not present

## 2022-11-14 DIAGNOSIS — F331 Major depressive disorder, recurrent, moderate: Secondary | ICD-10-CM

## 2022-11-14 MED ORDER — ALPRAZOLAM 1 MG PO TABS: 1.0000 mg | ORAL_TABLET | Freq: Three times a day (TID) | ORAL | 0 refills | Status: DC | PRN

## 2022-11-14 MED ORDER — FLUOXETINE HCL 20 MG PO CAPS: 60.0000 mg | ORAL_CAPSULE | Freq: Every day | ORAL | 3 refills | Status: DC

## 2022-11-14 NOTE — Progress Notes (Signed)
[facility]patient Progress Note  11/15/2022 9:56 AM Kimberly Adkins  MRN:  [id number]  Assessment:  Kimberly Adkins presents for follow-up evaluation in-person. Today, 11/15/22, patient reports continued stability with current medication regiment. Patient continues to be very resistant to any medication adjustments despite the high risk related to chronic xanax use. She requested return to original provider Trinna Post whom she was most comfortable working with. Will continue her medications until next appointment with Eddie. She is very insistent that she she should only have to see doctor every 3 months despite her being on a high risk medication regiment.    Identifying Information: Kimberly Adkins is a 67 y.o. y.o. female with a history of anxiety, OCD, and MDD who is an established patient with [facility] for medication management.   Plan:  1. Anxiety state -Continue Xanax 1 mg tid. STRONGLY ENCOURAGE taper but patient is unwilling to change   2. Mild episode of recurrent major depressive disorder (HCC)  -Continue FLUoxetine (PROZAC) 60 mg daily for depression   Patient was given contact information for behavioral health clinic and was instructed to call 911 for emergencies.   Subjective:  Chief Complaint:  Chief Complaint  Patient presents with  . Anxiety    Interval History: Reports no changes recently. She states she is only here for her medication refills. She is uninterested in medication adjustments. States she is anxious which is why her blood pressure is so high but also states her anxiety is well controlled with xanax. She states she doesn't know why we are unable to give her xanax 4 times daily and why she has to see a psychiatrist so frequently. She insists she should be able to see a psychiatrist every 3 months. Educated her again on the risks involving her current medication regiment. She states she is doing just fine. She states she no longer drinks  alcohol but continues to smoke regularly. She denies SI/HI/AVH. She requests switching providers to Trinna Post  Visit Diagnosis:    ICD-10-CM   1. Anxiety state  F41.1 ALPRAZolam (XANAX) 1 MG tablet    FLUoxetine (PROZAC) 20 MG capsule    ALPRAZolam (XANAX) 1 MG tablet    2. Moderate episode of recurrent major depressive disorder (HCC)  F33.1     3. Mild episode of recurrent major depressive disorder (HCC)  F33.0 FLUoxetine (PROZAC) 20 MG capsule    4. Benzodiazepine dependence (HCC)  F13.20       Past Psychiatric History: OCD, MDD, GAD  Past Medical History:  Past Medical History:  Diagnosis Date  . Anxiety   . Depression   . GERD (gastroesophageal reflux disease)   . Hyperlipidemia   . Hypertension   . Osteoarthritis   . PTSD (post-traumatic stress disorder) 2010    Past Surgical History:  Procedure Laterality Date  . ANTERIOR AND POSTERIOR REPAIR  2007  . BREAST BIOPSY  1997   right; benign  . TUBAL LIGATION  1994     Family History:  Family History  Problem Relation Age of Onset  . Breast cancer Paternal Grandmother        Age 33  . Colon polyps Paternal Grandmother 38  . Colon polyps Maternal Grandfather 73  . Cancer Mother        Breast cancer  . Breast cancer Mother        Age 47  . Coronary artery disease Father   . Hypertension Father   . Breast cancer  Paternal Aunt        Age 23's  . Cancer Maternal Grandmother        colon  . Heart attack Daughter     Social History:  Social History   Socioeconomic History  . Marital status: Widowed    Spouse name: Not on file  . Number of children: Not on file  . Years of education: Not on file  . Highest education level: Not on file  Occupational History  . Occupation: unemplyed  Tobacco Use  . Smoking status: Every Day    Packs/day: 1.50    Years: 22.00    Total pack years: 33.00    Types: Cigarettes  . Smokeless tobacco: Never  Vaping Use  . Vaping Use: Never used  Substance and Sexual  Activity  . Alcohol use: Yes  . Drug use: No  . Sexual activity: Never    Birth control/protection: Post-menopausal  Other Topics Concern  . Not on file  Social History Narrative  . Not on file   Social Determinants of Health   Financial Resource Strain: Not on file  Food Insecurity: Not on file  Transportation Needs: Not on file  Physical Activity: Not on file  Stress: Not on file  Social Connections: Not on file    Allergies:  Allergies  Allergen Reactions  . Hydromorphone Itching    Current Medications: Current Outpatient Medications  Medication Sig Dispense Refill  . [START ON 12/14/2022] ALPRAZolam (XANAX) 1 MG tablet Take 1 tablet (1 mg total) by mouth 3 (three) times daily as needed for anxiety. 90 tablet 0  . acetaminophen (TYLENOL) 500 MG tablet Take 1,000 mg by mouth every 4 (four) hours as needed for mild pain.    Marland Kitchen albuterol (VENTOLIN HFA) 108 (90 Base) MCG/ACT inhaler INHALE 2 PUFFS INTO THE LUNGS EVERY 6 (SIX) HOURS AS NEEDED FOR WHEEZING OR SHORTNESS OF BREATH. 18 g 1  . ALPRAZolam (XANAX) 1 MG tablet Take 1 tablet (1 mg total) by mouth 3 (three) times daily as needed for anxiety. for anxiety 90 tablet 0  . amLODipine (NORVASC) 10 MG tablet TAKE 1 TABLET (10 MG TOTAL) BY MOUTH DAILY. 90 tablet 1  . atorvastatin (LIPITOR) 40 MG tablet TAKE 1 TABLET (40 MG TOTAL) BY MOUTH DAILY. 90 tablet 3  . carvedilol (COREG) 3.125 MG tablet TAKE 1 TABLET (3.125 MG TOTAL) BY MOUTH 2 (TWO) TIMES DAILY WITH A MEAL. 180 tablet 1  . diphenhydrAMINE (BENADRYL) 25 mg capsule Take 25 mg by mouth at bedtime.    Marland Kitchen FLUoxetine (PROZAC) 20 MG capsule Take 3 capsules (60 mg total) by mouth daily. 90 capsule 3  . fluticasone-salmeterol (ADVAIR) 100-50 MCG/ACT AEPB Inhale 1 puff into the lungs 2 (two) times daily. 60 each 6  . ibuprofen (ADVIL,MOTRIN) 200 MG tablet Take 400 mg by mouth every 4 (four) hours as needed for headache or mild pain.    Marland Kitchen losartan (COZAAR) 100 MG tablet TAKE 1 TABLET  (100 MG TOTAL) BY MOUTH DAILY. 90 tablet 1   No current facility-administered medications for this visit.    ROS: Review of Systems  Objective:  Psychiatric Specialty Exam: Blood pressure (!) 141/116, pulse (!) 101.There is no height or weight on file to calculate BMI.  General Appearance: Fairly Groomed, malodorous  Eye Contact:  Fair  Speech:  Clear and Coherent and Normal Rate  Volume:  Normal  Mood:  Euthymic  Affect:  Appropriate and Congruent  Thought Process:  Coherent, Goal Directed, and  Linear  Orientation:  Full (Time, Place, and Person)  Thought Content: Logical   Suicidal Thoughts:  No  Homicidal Thoughts:  No  Memory:  Remote;   Good  Judgment:  Fair  Insight:  Shallow  Psychomotor Activity:  Normal  Concentration:  Concentration: Fair and Attention Span: Fair              Assets:  Agricultural consultant Housing Leisure Time Physical Health Resilience  ADL's:  Intact  Cognition: WNL  Sleep:  Fair   PE: General: well-appearing; no acute distress  Pulm: no increased work of breathing on room air  Strength & Muscle Tone: within normal limits Neuro: no focal neurological deficits observed  Gait & Station: normal  Metabolic Disorder Labs: Lab Results  Component Value Date   HGBA1C 5.8 (H) 01/01/2021   No results found for: "PROLACTIN" Lab Results  Component Value Date   CHOL 170 01/01/2021   TRIG 180 (H) 01/01/2021   HDL 34 (L) 01/01/2021   CHOLHDL 5.0 (H) 01/01/2021   VLDL 39.2 04/08/2013   LDLCALC 104 (H) 01/01/2021   LDLCALC 182 (H) 02/07/2020   Lab Results  Component Value Date   TSH 1.17 04/08/2013   TSH 1.69 01/22/2012    Therapeutic Level Labs: No results found for: "LITHIUM" No results found for: "VALPROATE" No results found for: "CBMZ"  Screenings: GAD-7    Flowsheet Row Video Visit from 05/30/2022 in [facility] Video Visit from 02/21/2022 in [facility] Video Visit from 11/22/2021 in [facility] Video Visit from 08/23/2021 in [facility] Video Visit from 05/23/2021 in [facility]  Total GAD-7 Score 10 11 8 12 12       PHQ2-9    Flowsheet Row Video Visit from 05/30/2022 in [facility] Video Visit from 02/21/2022 in [facility] Video Visit from 11/22/2021 in [facility] Video Visit from 08/23/2021 in [facility] Video Visit from 05/23/2021 in [facility]  PHQ-2 Total Score 6 2 1 5 3   PHQ-9 Total Score 12 5 -- 8 9      Flowsheet Row Video Visit from 05/30/2022 in [facility] Video Visit from 02/21/2022 in [facility] Video Visit from 11/22/2021 in [facility] No Risk No Risk No Risk       Collaboration of Care: Collaboration of Care:   Patient/Guardian was advised Release of Information must be obtained prior to any record release in order to collaborate their care with an outside provider. Patient/Guardian was advised if they have not already done so to contact the registration department to sign all necessary forms in order for [place] to release information regarding their care.   Consent: Patient/Guardian gives verbal consent for treatment and assignment of benefits for services provided during this visit. Patient/Guardian expressed understanding and agreed to proceed.   A total of 30 minutes was spent involved in face to face clinical care, chart review, and documentation.   France Ravens, MD 11/15/2022, 9:56 AM

## 2022-11-30 DIAGNOSIS — I10 Essential (primary) hypertension: Secondary | ICD-10-CM | POA: Diagnosis not present

## 2022-12-01 DIAGNOSIS — I10 Essential (primary) hypertension: Secondary | ICD-10-CM | POA: Diagnosis not present

## 2022-12-03 ENCOUNTER — Inpatient Hospital Stay: Admission: AD | Admit: 2022-12-03 | Payer: 59 | Admitting: Psychiatry

## 2022-12-05 NOTE — Group Note (Unsigned)
Date:  12/05/2022 Time:  3:55 AM  Group Topic/Focus:  Coping With Mental Health Crisis:   The purpose of this group is to help patients identify strategies for coping with mental health crisis.  Group discusses possible causes of crisis and ways to manage them effectively. Goals Group:   The focus of this group is to help patients establish daily goals to achieve during treatment and discuss how the patient can incorporate goal setting into their daily lives to aide in recovery.     Participation Level:  {B[facility] PARTICIPATION WO:[id number]  Participation Quality:  {BHH PARTICIPATION QUALITY:22265}  Affect:  {BHH AFFECT:22266}  Cognitive:  {BHH COGNITIVE:22267}  Insight: {BHH Insight2:20797}  Engagement in Group:  {BHH ENGAGEMENT IN GROUP:22268}  Modes of Intervention:  {BHH MODES OF INTERVENTION:22269}  Additional Comments:  ***  Kimberly Adkins 12/05/2022, 3:55 AM

## 2022-12-06 ENCOUNTER — Other Ambulatory Visit: Payer: Self-pay | Admitting: Nurse Practitioner

## 2022-12-06 ENCOUNTER — Inpatient Hospital Stay: Admission: AD | Admit: 2022-12-06 | Payer: 59 | Source: Intra-hospital | Admitting: Psychiatry

## 2022-12-07 ENCOUNTER — Inpatient Hospital Stay
Admission: AD | Admit: 2022-12-07 | Discharge: 2022-12-09 | DRG: 885 | Disposition: A | Discharge status: Home / Self Care | Payer: 59 | Source: Intra-hospital | Attending: Psychiatry | Admitting: Psychiatry

## 2022-12-07 DIAGNOSIS — Z79899 Other long term (current) drug therapy: Secondary | ICD-10-CM

## 2022-12-07 DIAGNOSIS — F330 Major depressive disorder, recurrent, mild: Secondary | ICD-10-CM

## 2022-12-07 DIAGNOSIS — F429 Obsessive-compulsive disorder, unspecified: Secondary | ICD-10-CM | POA: Diagnosis present

## 2022-12-07 DIAGNOSIS — T43222A Poisoning by selective serotonin reuptake inhibitors, intentional self-harm, initial encounter: Secondary | ICD-10-CM | POA: Diagnosis present

## 2022-12-07 DIAGNOSIS — F17210 Nicotine dependence, cigarettes, uncomplicated: Secondary | ICD-10-CM | POA: Diagnosis present

## 2022-12-07 DIAGNOSIS — J4489 Other specified chronic obstructive pulmonary disease: Secondary | ICD-10-CM | POA: Diagnosis present

## 2022-12-07 DIAGNOSIS — F411 Generalized anxiety disorder: Secondary | ICD-10-CM

## 2022-12-07 DIAGNOSIS — F609 Personality disorder, unspecified: Secondary | ICD-10-CM | POA: Diagnosis present

## 2022-12-07 DIAGNOSIS — Z8616 Personal history of COVID-19: Secondary | ICD-10-CM

## 2022-12-07 DIAGNOSIS — F4310 Post-traumatic stress disorder, unspecified: Secondary | ICD-10-CM | POA: Diagnosis present

## 2022-12-07 DIAGNOSIS — F332 Major depressive disorder, recurrent severe without psychotic features: Principal | ICD-10-CM | POA: Diagnosis present

## 2022-12-07 DIAGNOSIS — I10 Essential (primary) hypertension: Secondary | ICD-10-CM | POA: Diagnosis present

## 2022-12-07 MED ORDER — ACETAMINOPHEN 325 MG PO TABS
650.0000 mg | ORAL_TABLET | Freq: Four times a day (QID) | ORAL | Status: DC | PRN
  Administered 2022-12-08 – 2022-12-09 (×3): 650 mg via ORAL
  Filled 2022-12-07 (×3): qty 2

## 2022-12-07 MED ORDER — HYDROXYZINE HCL 25 MG PO TABS
25.0000 mg | ORAL_TABLET | Freq: Three times a day (TID) | ORAL | Status: DC | PRN
  Administered 2022-12-07: 25 mg via ORAL
  Filled 2022-12-07: qty 1

## 2022-12-07 MED ORDER — TRAZODONE HCL 50 MG PO TABS
50.0000 mg | ORAL_TABLET | Freq: Every evening | ORAL | Status: DC | PRN
  Administered 2022-12-07 – 2022-12-08 (×2): 50 mg via ORAL
  Filled 2022-12-07 (×2): qty 1

## 2022-12-07 MED ORDER — ALUM & MAG HYDROXIDE-SIMETH 200-200-20 MG/5ML PO SUSP: 30.0000 mL | ORAL | Status: DC | PRN

## 2022-12-07 MED ORDER — MAGNESIUM HYDROXIDE 400 MG/5ML PO SUSP: 30.0000 mL | Freq: Every day | ORAL | Status: DC | PRN

## 2022-12-08 ENCOUNTER — Other Ambulatory Visit: Payer: Self-pay

## 2022-12-08 ENCOUNTER — Encounter: Payer: Self-pay | Admitting: Family

## 2022-12-08 DIAGNOSIS — F332 Major depressive disorder, recurrent severe without psychotic features: Secondary | ICD-10-CM | POA: Diagnosis not present

## 2022-12-08 LAB — LIPID PANEL
Cholesterol: 239 mg/dL — ABNORMAL HIGH (ref 0–200)
HDL: 35 mg/dL — ABNORMAL LOW (ref >40)
LDL Cholesterol: 164 mg/dL — ABNORMAL HIGH (ref 0–99)
Total CHOL/HDL Ratio: 6.8 RATIO
Triglycerides: 198 mg/dL — ABNORMAL HIGH (ref <150)
VLDL: 40 mg/dL (ref 0–40)

## 2022-12-08 LAB — CBC
HCT: 39.4 % (ref 36.0–46.0)
Hemoglobin: 13 g/dL (ref 12.0–15.0)
MCH: 28.3 pg (ref 26.0–34.0)
MCHC: 33 g/dL (ref 30.0–36.0)
MCV: 85.7 fL (ref 80.0–100.0)
Platelets: 287 10*3/uL (ref 150–400)
RBC: 4.6 MIL/uL (ref 3.87–5.11)
RDW: 14.8 % (ref 11.5–15.5)
WBC: 8.1 10*3/uL (ref 4.0–10.5)
nRBC: 0 % (ref 0.0–0.2)

## 2022-12-08 LAB — COMPREHENSIVE METABOLIC PANEL
ALT: 52 U/L — ABNORMAL HIGH (ref 0–44)
AST: 42 U/L — ABNORMAL HIGH (ref 15–41)
Albumin: 3.7 g/dL (ref 3.5–5.0)
Alkaline Phosphatase: 61 U/L (ref 38–126)
Anion gap: 9 (ref 5–15)
BUN: 8 mg/dL (ref 8–23)
CO2: 24 mmol/L (ref 22–32)
Calcium: 8.9 mg/dL (ref 8.9–10.3)
Chloride: 107 mmol/L (ref 98–111)
Creatinine, Ser: 0.71 mg/dL (ref 0.44–1.00)
GFR, Estimated: >60 mL/min (ref >60)
Glucose, Bld: 102 mg/dL — ABNORMAL HIGH (ref 70–99)
Potassium: 3.2 mmol/L — ABNORMAL LOW (ref 3.5–5.1)
Sodium: 140 mmol/L (ref 135–145)
Total Bilirubin: 0.2 mg/dL — ABNORMAL LOW (ref 0.3–1.2)
Total Protein: 6.7 g/dL (ref 6.5–8.1)

## 2022-12-08 LAB — TSH: TSH: 1.902 u[IU]/mL (ref 0.350–4.500)

## 2022-12-08 MED ORDER — OLANZAPINE 5 MG PO TABS: 5.0000 mg | ORAL_TABLET | Freq: Four times a day (QID) | ORAL | Status: DC | PRN

## 2022-12-08 MED ORDER — ALBUTEROL SULFATE HFA 108 (90 BASE) MCG/ACT IN AERS: 2.0000 | INHALATION_SPRAY | RESPIRATORY_TRACT | Status: DC | PRN

## 2022-12-08 MED ORDER — AMLODIPINE BESYLATE 5 MG PO TABS
10.0000 mg | ORAL_TABLET | Freq: Every day | ORAL | Status: DC
  Administered 2022-12-08 – 2022-12-09 (×2): 10 mg via ORAL
  Filled 2022-12-08 (×2): qty 2

## 2022-12-08 MED ORDER — FLUOXETINE HCL 20 MG PO CAPS
60.0000 mg | ORAL_CAPSULE | Freq: Every day | ORAL | Status: DC
  Administered 2022-12-08 – 2022-12-09 (×2): 60 mg via ORAL
  Filled 2022-12-08 (×2): qty 3

## 2022-12-08 MED ORDER — ATORVASTATIN CALCIUM 20 MG PO TABS
40.0000 mg | ORAL_TABLET | Freq: Every day | ORAL | Status: DC
  Administered 2022-12-08 – 2022-12-09 (×2): 40 mg via ORAL
  Filled 2022-12-08 (×2): qty 2

## 2022-12-08 MED ORDER — ALPRAZOLAM 0.5 MG PO TABS
1.0000 mg | ORAL_TABLET | Freq: Three times a day (TID) | ORAL | Status: DC
  Administered 2022-12-08 – 2022-12-09 (×4): 1 mg via ORAL
  Filled 2022-12-08 (×4): qty 2

## 2022-12-08 MED ORDER — CARVEDILOL 3.125 MG PO TABS
3.1250 mg | ORAL_TABLET | Freq: Two times a day (BID) | ORAL | Status: DC
  Administered 2022-12-08 – 2022-12-09 (×2): 3.125 mg via ORAL
  Filled 2022-12-08 (×3): qty 1

## 2022-12-08 NOTE — Tx Team (Cosign Needed)
Initial Treatment Plan 12/08/2022 3:58 AM [name] [id number]    PATIENT STRESSORS: Financial difficulties   Medication change or noncompliance   Substance abuse     PATIENT STRENGTHS: Ability for insight  Active sense of humor  Motivation for treatment/growth    PATIENT IDENTIFIED PROBLEMS: Anxiety    Depression                  DISCHARGE CRITERIA:  Adequate post-discharge living arrangements Improved stabilization in mood, thinking, and/or behavior  PRELIMINARY DISCHARGE PLAN: Outpatient therapy  PATIENT/FAMILY INVOLVEMENT: This treatment plan has been presented to and reviewed with the patient, Kimberly Adkins,   The patient and family have been given the opportunity to ask questions and make suggestions.  Harl Bowie, RN 12/08/2022, 3:58 AM

## 2022-12-08 NOTE — B[facility] Group Notes (Signed)
[facility] Group Notes:  (Nursing/MHT/Case Management/Adjunct)  Date:  12/08/2022  Time:  10:37 AM  Type of Therapy:   community meeting  Participation Level:  Did Not Attend    Antonieta Pert 12/08/2022, 10:37 AM

## 2022-12-08 NOTE — BHH Group Notes (Signed)
[facility] Group Notes:  (Nursing/MHT/Case Management/Adjunct)  Date:  12/08/2022  Time:  10:08 PM  Type of Therapy:  Group Therapy  Participation Level:  Did Not Attend    Maglione,Kynsley Whitehouse E 12/08/2022, 10:08 PM

## 2022-12-08 NOTE — Plan of Care (Signed)
Patient denies SI/HI/AVH. Pt is appropriate, without complaint. No signs of distress or injury. Staff will continue to monitor to safety q15 mins.     Problem: Education: Goal: Knowledge of General Education information will improve Description: Including pain rating scale, medication(s)/side effects and non-pharmacologic comfort measures Outcome: Not Progressing   Problem: Health Behavior/Discharge Planning: Goal: Ability to manage health-related needs will improve Outcome: Not Progressing
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
## 2022-12-08 NOTE — Progress Notes (Signed)
Admission Note:  67 yr female who presents IVC in no acute distress for the treatment of Anxiety and Depression. Patient appears flat and sat but brightens upon approach, patient is positive of COVID and placed on the locked unit. Patient  was calm and cooperative with admission process and currently denies SI/HI/AVH. Patient contracts for safety upon admission. Patient has Past medical Hx of Anxiety, Depression and Benzodiazepine dependence. Patient skin was in presence of [place] it was noted warm and dry, also searched and no contraband found, POC and unit policies explained and understanding verbalized. Patient was offered emotional support and given food and drinks and she was receptive. 15 minutes safety checks maintained will continue to monitor.

## 2022-12-08 NOTE — BHH Suicide Risk Assessment (Signed)
[facility] Admission Suicide Risk Assessment   Nursing information obtained from:  Patient Demographic factors:  Divorced or widowed, Caucasian, Living alone, Unemployed Current Mental Status:  NA Loss Factors:  Decrease in vocational status, Loss of significant relationship Historical Factors:  Impulsivity Risk Reduction Factors:  Sense of responsibility to family  Total Time spent with patient: 1 hour Principal Problem: MDD (major depressive disorder), recurrent severe, without psychosis ([place]) Diagnosis:  Principal Problem:   MDD (major depressive disorder), recurrent severe, without psychosis ([place])  Subjective Data: 67 yr female who presents IVC in no acute distress for the treatment of Anxiety and Depression. Patient appears flat and sat but brightens upon approach, patient is positive of COVID and placed on the locked unit. Patient  was calm and cooperative with admission process and currently denies SI/HI/AVH. Patient contracts for safety upon admission. Patient has Past medical Hx of Anxiety, Depression and Benzodiazepine dependence.   Continued Clinical Symptoms:  Alcohol Use Disorder Identification Test Final Score (AUDIT): 3 The "Alcohol Use Disorders Identification Test", Guidelines for Use in Primary Care, Second Edition.  World Pharmacologist [facility]). Score between 0-7:  no or low risk or alcohol related problems. Score between 8-15:  moderate risk of alcohol related problems. Score between 16-19:  high risk of alcohol related problems. Score 20 or above:  warrants further diagnostic evaluation for alcohol dependence and treatment.   CLINICAL FACTORS:   Severe Anxiety and/or Agitation Depression:   Impulsivity Obsessive-Compulsive Disorder   Musculoskeletal: Strength & Muscle Tone: within normal limits Gait & Station: normal Patient leans: N/A  Psychiatric Specialty Exam:  Presentation  General Appearance: No data recorded Eye Contact:No data recorded Speech:No data  recorded Speech Volume:No data recorded Handedness:No data recorded  Mood and Affect  Mood:No data recorded Affect:No data recorded  Thought Process  Thought Processes:No data recorded Descriptions of Associations:No data recorded Orientation:No data recorded Thought Content:No data recorded History of Schizophrenia/Schizoaffective disorder:No data recorded Duration of Psychotic Symptoms:No data recorded Hallucinations:No data recorded Ideas of Reference:No data recorded Suicidal Thoughts:No data recorded Homicidal Thoughts:No data recorded  Sensorium  Memory:No data recorded Judgment:No data recorded Insight:No data recorded  Executive Functions  Concentration:No data recorded Attention Span:No data recorded Recall:No data recorded Fund of Knowledge:No data recorded Language:No data recorded  Psychomotor Activity  Psychomotor Activity:No data recorded  Assets  Assets:No data recorded  Sleep  Sleep:No data recorded    Blood pressure (!) 142/84, pulse 87, temperature 98.7 F (37.1 C), temperature source Oral, resp. rate 18, height '5\' 2"'$  (1.575 m), weight 90.9 kg. Body mass index is 36.65 kg/m.   COGNITIVE FEATURES THAT CONTRIBUTE TO RISK:  None    SUICIDE RISK:   Minimal: No identifiable suicidal ideation.  Patients presenting with no risk factors but with morbid ruminations; may be classified as minimal risk based on the severity of the depressive symptoms  PLAN OF CARE: See orders  I certify that inpatient services furnished can reasonably be expected to improve the patient's condition.   Parks Ranger, DO 12/08/2022, 12:03 PM

## 2022-12-09 ENCOUNTER — Encounter: Payer: Self-pay | Admitting: Family

## 2022-12-09 DIAGNOSIS — F332 Major depressive disorder, recurrent severe without psychotic features: Secondary | ICD-10-CM | POA: Diagnosis not present

## 2022-12-09 LAB — HEMOGLOBIN A1C
Hgb A1c MFr Bld: 5.8 % — ABNORMAL HIGH (ref 4.8–5.6)
Mean Plasma Glucose: 120 mg/dL

## 2022-12-09 MED ORDER — FLUOXETINE HCL 20 MG PO CAPS: 60.0000 mg | ORAL_CAPSULE | Freq: Every day | ORAL | 1 refills | Status: DC

## 2022-12-09 MED ORDER — AMLODIPINE BESYLATE 10 MG PO TABS: 10.0000 mg | ORAL_TABLET | Freq: Every day | ORAL | 1 refills | Status: DC

## 2022-12-09 MED ORDER — ATORVASTATIN CALCIUM 40 MG PO TABS: 40.0000 mg | ORAL_TABLET | Freq: Every day | ORAL | 1 refills | Status: DC

## 2022-12-09 MED ORDER — ALPRAZOLAM 1 MG PO TABS: 1.0000 mg | ORAL_TABLET | Freq: Three times a day (TID) | ORAL | 0 refills | Status: DC

## 2022-12-09 MED ORDER — ALBUTEROL SULFATE HFA 108 (90 BASE) MCG/ACT IN AERS: 2.0000 | INHALATION_SPRAY | RESPIRATORY_TRACT | 1 refills | Status: DC | PRN

## 2022-12-09 MED ORDER — CARVEDILOL 3.125 MG PO TABS: 3.1250 mg | ORAL_TABLET | Freq: Two times a day (BID) | ORAL | 1 refills | Status: DC

## 2022-12-09 NOTE — Group Note (Deleted)
Recreation Therapy Group Note   Group Topic:Communication  Group Date: 12/09/2022 Start Time:  2:00 PM End Time:  2:50 PM Facilitators: Vilma Prader, LRT Location: Courtyard       Affect/Mood: {RT BHH Affect/Mood:26271}   Participation Level: {RT BHH Participation Level:26267}   Participation Quality: {RT BHH Participation Quality:26268}   Behavior: {RT BHH Group Behavior:26269}   Speech/Thought Process: {RT BHH Speech/Thought:26276}   Insight: {RT BHH Insight:26272}   Judgement: {RT BHH Judgement:26278}   Modes of Intervention: {RT BHH Modes of Intervention:26277}   Patient Response to Interventions:  {RT BHH Patient Response to Intervention:26274}   Education Outcome:  {RT [place] Education Outcome:26279}   Clinical Observations/Individualized Feedback: *** was *** in their participation of session activities and group discussion. Pt identified ***   Plan: {RT BHH Tx [id number]   Vilma Prader, LRT,  12/09/2022 2:53 PM

## 2022-12-09 NOTE — Progress Notes (Signed)
Patient ID: Kimberly Adkins, female   DOB: October 01, 1956, 67 y.o.   MRN: [id number]  Discharge Note:  Patient denies SI/HI/AVH at this time. Discharge instructions, AVS, prescriptions, Suicide Safety Plan and transition record gone over with patient. Patient agrees to comply with medication management, follow-up visit, and outpatient therapy. Patient belongings returned to patient. Patient questions and concerns addressed and answered. Patient ambulatory off unit. Patient discharged to home with Sister.

## 2022-12-09 NOTE — Progress Notes (Signed)
D: Patient alert and oriented  x 4,  she denies SI/HI/AVH, affect is blunted, she is pleasant and cooperative. Patient stated she feels better from her getting her medication reconciled, she appears less anxious and she is interacting with peers and staff appropriately.  A: Patient was offered support and encouragement, she was given scheduled medication,15 minute checks were done for safety.  R: Patient is complaint with medication.15 minutes safety checks maintained, will continue to monitor.

## 2022-12-09 NOTE — BH IP Treatment Plan (Signed)
Interdisciplinary Treatment and Diagnostic Plan Update  12/09/2022 Time of Session: 0830 Kimberly Adkins MRN: [id number]  Principal Diagnosis: MDD (major depressive disorder), recurrent severe, without psychosis ([place])  Secondary Diagnoses: Principal Problem:   MDD (major depressive disorder), recurrent severe, without psychosis ([place])   Current Medications:  Current Facility-Administered Medications  Medication Dose Route Frequency Provider Last Rate Last Admin   acetaminophen (TYLENOL) tablet 650 mg  650 mg Oral Q6H PRN Onuoha, Chinwendu V, NP   650 mg at 12/09/22 0852   albuterol (VENTOLIN HFA) 108 (90 Base) MCG/ACT inhaler 2 puff  2 puff Inhalation Q4H PRN Parks Ranger, DO       ALPRAZolam Duanne Moron) tablet 1 mg  1 mg Oral TID Parks Ranger, DO   1 mg at 12/09/22 0851   alum & mag hydroxide-simeth (MAALOX/MYLANTA) 200-200-20 MG/5ML suspension 30 mL  30 mL Oral Q4H PRN Onuoha, Chinwendu V, NP       amLODipine (NORVASC) tablet 10 mg  10 mg Oral Daily Parks Ranger, DO   10 mg at 12/09/22 0851   atorvastatin (LIPITOR) tablet 40 mg  40 mg Oral Daily Parks Ranger, DO   40 mg at 12/09/22 [id number]   carvedilol (COREG) tablet 3.125 mg  3.125 mg Oral BID WC Parks Ranger, DO   3.125 mg at 12/09/22 0851   FLUoxetine (PROZAC) capsule 60 mg  60 mg Oral Daily Parks Ranger, DO   60 mg at 12/09/22 [id number]   hydrOXYzine (ATARAX) tablet 25 mg  25 mg Oral TID PRN Onuoha, Chinwendu V, NP   25 mg at 12/07/22 2232   magnesium hydroxide (MILK OF MAGNESIA) suspension 30 mL  30 mL Oral Daily PRN Onuoha, Chinwendu V, NP       OLANZapine (ZYPREXA) tablet 5 mg  5 mg Oral Q6H PRN Parks Ranger, DO       traZODone (DESYREL) tablet 50 mg  50 mg Oral QHS PRN Onuoha, Chinwendu V, NP   50 mg at 12/08/22 2140   PTA Medications: Medications Prior to Admission  Medication Sig Dispense Refill Last Dose   acetaminophen (TYLENOL) 500 MG tablet Take 1,000 mg by  mouth every 4 (four) hours as needed for mild pain.      albuterol (VENTOLIN HFA) 108 (90 Base) MCG/ACT inhaler INHALE 2 PUFFS INTO THE LUNGS EVERY 6 (SIX) HOURS AS NEEDED FOR WHEEZING OR SHORTNESS OF BREATH. 18 g 1    ALPRAZolam (XANAX) 1 MG tablet Take 1 tablet (1 mg total) by mouth 3 (three) times daily as needed for anxiety. for anxiety 90 tablet 0    [START ON 12/14/2022] ALPRAZolam (XANAX) 1 MG tablet Take 1 tablet (1 mg total) by mouth 3 (three) times daily as needed for anxiety. 90 tablet 0    amLODipine (NORVASC) 10 MG tablet TAKE 1 TABLET (10 MG TOTAL) BY MOUTH DAILY. 90 tablet 1    atorvastatin (LIPITOR) 40 MG tablet TAKE 1 TABLET (40 MG TOTAL) BY MOUTH DAILY. 90 tablet 3    carvedilol (COREG) 3.125 MG tablet TAKE 1 TABLET (3.125 MG TOTAL) BY MOUTH 2 (TWO) TIMES DAILY WITH A MEAL. 180 tablet 1    diphenhydrAMINE (BENADRYL) 25 mg capsule Take 25 mg by mouth at bedtime.      FLUoxetine (PROZAC) 20 MG capsule Take 3 capsules (60 mg total) by mouth daily. 90 capsule 3    fluticasone-salmeterol (ADVAIR) 100-50 MCG/ACT AEPB Inhale 1 puff into the lungs 2 (two) times daily. [place]  each 6    ibuprofen (ADVIL,MOTRIN) 200 MG tablet Take 400 mg by mouth every 4 (four) hours as needed for headache or mild pain.      losartan (COZAAR) 100 MG tablet TAKE 1 TABLET (100 MG TOTAL) BY MOUTH DAILY. 90 tablet 1     Patient Stressors: Financial difficulties   Medication change or noncompliance   Substance abuse    Patient Strengths: Ability for insight  Active sense of humor  Motivation for treatment/growth   Treatment Modalities: Medication Management, Group therapy, Case management,  1 to 1 session with clinician, Psychoeducation, Recreational therapy.   Physician Treatment Plan for Primary Diagnosis: MDD (major depressive disorder), recurrent severe, without psychosis ([place]) Long Term Goal(s): Improvement in symptoms so as ready for discharge   Short Term Goals: Ability to identify changes in  lifestyle to reduce recurrence of condition will improve Ability to verbalize feelings will improve Ability to disclose and discuss suicidal ideas Ability to demonstrate self-control will improve Ability to identify and develop effective coping behaviors will improve Ability to maintain clinical measurements within normal limits will improve Compliance with prescribed medications will improve Ability to identify triggers associated with substance abuse/mental health issues will improve  Medication Management: Evaluate patient's response, side effects, and tolerance of medication regimen.  Therapeutic Interventions: 1 to 1 sessions, Unit Group sessions and Medication administration.  Evaluation of Outcomes: Adequate for Discharge  Physician Treatment Plan for Secondary Diagnosis: Principal Problem:   MDD (major depressive disorder), recurrent severe, without psychosis ([place])  Long Term Goal(s): Improvement in symptoms so as ready for discharge   Short Term Goals: Ability to identify changes in lifestyle to reduce recurrence of condition will improve Ability to verbalize feelings will improve Ability to disclose and discuss suicidal ideas Ability to demonstrate self-control will improve Ability to identify and develop effective coping behaviors will improve Ability to maintain clinical measurements within normal limits will improve Compliance with prescribed medications will improve Ability to identify triggers associated with substance abuse/mental health issues will improve     Medication Management: Evaluate patient's response, side effects, and tolerance of medication regimen.  Therapeutic Interventions: 1 to 1 sessions, Unit Group sessions and Medication administration.  Evaluation of Outcomes: Adequate for Discharge   RN Treatment Plan for Primary Diagnosis: MDD (major depressive disorder), recurrent severe, without psychosis ([place]) Long Term Goal(s): Knowledge of disease and  therapeutic regimen to maintain health will improve  Short Term Goals: Ability to remain free from injury will improve, Ability to verbalize frustration and anger appropriately will improve, Ability to demonstrate self-control, Ability to participate in decision making will improve, Ability to verbalize feelings will improve, Ability to disclose and discuss suicidal ideas, Ability to identify and develop effective coping behaviors will improve, and Compliance with prescribed medications will improve  Medication Management: RN will administer medications as ordered by provider, will assess and evaluate patient's response and provide education to patient for prescribed medication. RN will report any adverse and/or side effects to prescribing provider.  Therapeutic Interventions: 1 on 1 counseling sessions, Psychoeducation, Medication administration, Evaluate responses to treatment, Monitor vital signs and CBGs as ordered, Perform/monitor CIWA, COWS, AIMS and Fall Risk screenings as ordered, Perform wound care treatments as ordered.  Evaluation of Outcomes: Adequate for Discharge   LCSW Treatment Plan for Primary Diagnosis: MDD (major depressive disorder), recurrent severe, without psychosis ([place]) Long Term Goal(s): Safe transition to appropriate next level of care at discharge, Engage patient in therapeutic group addressing interpersonal concerns.  Short Term  Goals: Engage patient in aftercare planning with referrals and resources, Increase social support, Increase ability to appropriately verbalize feelings, Increase emotional regulation, Facilitate acceptance of mental health diagnosis and concerns, Facilitate patient progression through stages of change regarding substance use diagnoses and concerns, Identify triggers associated with mental health/substance abuse issues, and Increase skills for wellness and recovery  Therapeutic Interventions: Assess for all discharge needs, 1 to 1 time with Social  worker, Explore available resources and support systems, Assess for adequacy in community support network, Educate family and significant other(s) on suicide prevention, Complete Psychosocial Assessment, Interpersonal group therapy.  Evaluation of Outcomes: Adequate for Discharge   Progress in Treatment: Attending groups: No. Participating in groups: No. Taking medication as prescribed: Yes. Toleration medication: Yes. Family/Significant other contact made: No, will contact:  Patient declined consent for CSW to reach family/friend.  Patient understands diagnosis: Yes. Discussing patient identified problems/goals with staff: Yes. Medical problems stabilized or resolved: Yes. Denies suicidal/homicidal ideation: Yes. Issues/concerns per patient self-inventory: Yes. Other: none  New problem(s) identified: No, Describe:  none  New Short Term/Long Term Goal(s): Patient to continue working towards treatment goals after discharge. Patient no longer meets criteria for inpatient criteria per attending physician. Continue taking medications as prescribed, nursing to provide instructions at discharge. Follow up with all scheduled appointments.   Patient Goals:  Patient states their goal for treatment is to "get out of here I learned my lesson."  Discharge Plan or Barriers: No psychosocial barriers identified at this time, patient to return to place of residence when appropriate for discharge.   Reason for Continuation of Hospitalization: NA, patient scheduled to discharge on this day   Estimated Length of Stay: NA  Last [place] Suicide Severity Risk Score: [place] Admission (Current) from 12/07/2022 in [facility] Video Visit from 05/30/2022 in [facility] Video Visit from 02/21/2022 in [place] No Risk No Risk No Risk       Last PHQ 2/9 Scores:    05/30/2022    3:43 PM 02/21/2022     3:40 PM 11/22/2021    3:38 PM  Depression screen PHQ 2/9  Decreased Interest 3 1 0  Down, Depressed, Hopeless '3 1 1  '$ PHQ - 2 Score '6 2 1  '$ Altered sleeping 1 0   Tired, decreased energy 3 1   Change in appetite 0 0   Feeling bad or failure about yourself  2 2   Trouble concentrating 0 0   Moving slowly or fidgety/restless 0 0   Suicidal thoughts 0 0   PHQ-9 Score 12 5   Difficult doing work/chores Very difficult Not difficult at all     Scribe for Treatment Team: Durenda Hurt, Latanya Presser 12/09/2022 10:15 AM

## 2022-12-09 NOTE — Discharge Summary (Signed)
Physician Discharge Summary Note  Patient:  Kimberly Adkins is an 67 y.o., female MRN:  [id number] DOB:  [birth year] Patient phone:  [phone] (home)  Patient address:   [address] [place] [address],  Total Time spent with patient: 30 minutes  Date of Admission:  12/07/2022 Date of Discharge: 12/09/2022  Reason for Admission: Patient was admitted in transfer from outside hospital for continued treatment of depression and anxiety after an intentional overdose of prescription medicine.  Principal Problem: MDD (major depressive disorder), recurrent severe, without psychosis ([facility]) Discharge Diagnoses: Principal Problem:   MDD (major depressive disorder), recurrent severe, without psychosis ([facility]) Active Problems:   Obsessive-compulsive disorder   PTSD   Past Psychiatric History: Past history of longstanding depression and obsessive-compulsive disorder and PTSD.  Longstanding treatment with Xanax and fluoxetine.  Denies prior suicide attempts.  Denies mania or psychosis.  Patient admits she had been drinking at that time as well.  Past Medical History:  Past Medical History:  Diagnosis Date   Anxiety    Depression    GERD (gastroesophageal reflux disease)    Hyperlipidemia    Hypertension    Osteoarthritis    PTSD (post-traumatic stress disorder) 2010    Past Surgical History:  Procedure Laterality Date   ANTERIOR AND POSTERIOR REPAIR  2007   BREAST BIOPSY  1997   right; benign   TUBAL LIGATION  1994   Family History:  Family History  Problem Relation Age of Onset   Breast cancer Paternal Grandmother        Age 22   Colon polyps Paternal Grandmother 49   Colon polyps Maternal Grandfather 73   Cancer Mother        Breast cancer   Breast cancer Mother        Age 18   Coronary artery disease Father    Hypertension Father    Breast cancer Paternal Aunt        Age 52's   Cancer Maternal Grandmother        colon   Heart attack Daughter    Family  Psychiatric  History: See previous.  Substance abuse in family Social History:  Social History   Substance and Sexual Activity  Alcohol Use Yes     Social History   Substance and Sexual Activity  Drug Use No    Social History   Socioeconomic History   Marital status: Widowed    Spouse name: Not on file   Number of children: Not on file   Years of education: Not on file   Highest education level: Not on file  Occupational History   Occupation: unemplyed  Tobacco Use   Smoking status: Every Day    Packs/day: 1.50    Years: 22.00    Total pack years: 33.00    Types: Cigarettes   Smokeless tobacco: Never  Vaping Use   Vaping Use: Never used  Substance and Sexual Activity   Alcohol use: Yes   Drug use: No   Sexual activity: Never    Birth control/protection: Post-menopausal  Other Topics Concern   Not on file  Social History Narrative   Not on file   Social Determinants of Health   Financial Resource Strain: Not on file  Food Insecurity: No Food Insecurity (12/08/2022)   Hunger Vital Sign    Worried About Running Out of Food in the Last Year: Never true    Ran Out of Food in the Last Year: Never true  Transportation Needs: No  Transportation Needs (12/07/2022)   PRAPARE - Administrator, Civil Service (Medical): No    Lack of Transportation (Non-Medical): No  Physical Activity: Not on file  Stress: Not on file  Social Connections: Not on file    Hospital Course: Patient admitted to psychiatric unit.  15-minute checks continued.  Patient was kept on the back call because of concern about positive tests for COVID and RSV prior to admission.  Other than some nasal congestion she has been asymptomatic without any fever since coming in.  Patient consistently denies suicidal ideation.  States mood is feeling better.  Patient denies homicidal ideation denies psychosis and states she understands now she should not mix alcohol and her prescription medicine and  swears she will not make the mistake of trying to overdose again.  She agrees to outpatient follow-up with her usual outpatient provider and has a safe place to stay with family.  Patient can be discharged today with prescriptions for medicine.  Physical Findings: AIMS:  , ,  ,  ,    CIWA:    COWS:     Musculoskeletal: Strength & Muscle Tone: within normal limits Gait & Station: normal Patient leans: N/A   Psychiatric Specialty Exam:  Presentation  General Appearance: No data recorded Eye Contact:No data recorded Speech:No data recorded Speech Volume:No data recorded Handedness:No data recorded  Mood and Affect  Mood:No data recorded Affect:No data recorded  Thought Process  Thought Processes:No data recorded Descriptions of Associations:No data recorded Orientation:No data recorded Thought Content:No data recorded History of Schizophrenia/Schizoaffective disorder:No data recorded Duration of Psychotic Symptoms:No data recorded Hallucinations:No data recorded Ideas of Reference:No data recorded Suicidal Thoughts:No data recorded Homicidal Thoughts:No data recorded  Sensorium  Memory:No data recorded Judgment:No data recorded Insight:No data recorded  Executive Functions  Concentration:No data recorded Attention Span:No data recorded Recall:No data recorded Fund of Knowledge:No data recorded Language:No data recorded  Psychomotor Activity  Psychomotor Activity:No data recorded  Assets  Assets:No data recorded  Sleep  Sleep:No data recorded   Physical Exam: Physical Exam Vitals and nursing note reviewed.  Constitutional:      Appearance: Normal appearance.  HENT:     Head: Normocephalic and atraumatic.     Mouth/Throat:     Pharynx: Oropharynx is clear.  Eyes:     Pupils: Pupils are equal, round, and reactive to light.  Cardiovascular:     Rate and Rhythm: Normal rate and regular rhythm.  Pulmonary:     Effort: Pulmonary effort is normal.      Breath sounds: Normal breath sounds.  Abdominal:     General: Abdomen is flat.     Palpations: Abdomen is soft.  Musculoskeletal:        General: Normal range of motion.  Skin:    General: Skin is warm and dry.  Neurological:     General: No focal deficit present.     Mental Status: She is alert. Mental status is at baseline.  Psychiatric:        Attention and Perception: Attention normal.        Mood and Affect: Mood normal.        Speech: Speech normal.        Behavior: Behavior normal.        Thought Content: Thought content normal.        Cognition and Memory: Cognition normal.        Judgment: Judgment normal.    Review of Systems  Constitutional: Negative.  HENT: Negative.    Eyes: Negative.   Respiratory: Negative.    Cardiovascular: Negative.   Gastrointestinal: Negative.   Musculoskeletal: Negative.   Skin: Negative.   Neurological: Negative.   Psychiatric/Behavioral: Negative.     Blood pressure 135/72, pulse 76, temperature 98.9 F (37.2 C), temperature source Oral, resp. rate 18, height 5\' 2"  (1.575 m), weight 90.9 kg, SpO2 98 %. Body mass index is 36.65 kg/m.   Social History   Tobacco Use  Smoking Status Every Day   Packs/day: 1.50   Years: 22.00   Total pack years: 33.00   Types: Cigarettes  Smokeless Tobacco Never   Tobacco Cessation:  A prescription for an FDA-approved tobacco cessation medication was offered at discharge and the patient refused   Blood Alcohol level:  No results found for: "ETH"  Metabolic Disorder Labs:  Lab Results  Component Value Date   HGBA1C 5.8 (H) 12/08/2022   MPG 120 12/08/2022   No results found for: "PROLACTIN" Lab Results  Component Value Date   CHOL 239 (H) 12/08/2022   TRIG 198 (H) 12/08/2022   HDL 35 (L) 12/08/2022   CHOLHDL 6.8 12/08/2022   VLDL 40 12/08/2022   LDLCALC 164 (H) 12/08/2022   LDLCALC 104 (H) 01/01/2021    See Psychiatric Specialty Exam and Suicide Risk Assessment completed by  Attending Physician prior to discharge.  Discharge destination:  Home  Is patient on multiple antipsychotic therapies at discharge:  No   Has Patient had three or more failed trials of antipsychotic monotherapy by history:  No  Recommended Plan for Multiple Antipsychotic Therapies: NA  Discharge Instructions     Diet - low sodium heart healthy   Complete by: As directed    Increase activity slowly   Complete by: As directed       Allergies as of 12/09/2022       Reactions   Hydromorphone Itching        Medication List     STOP taking these medications    acetaminophen 500 MG tablet Commonly known as: TYLENOL   Advair Diskus 100-50 MCG/ACT Aepb Generic drug: fluticasone-salmeterol   diphenhydrAMINE 25 mg capsule Commonly known as: BENADRYL   ibuprofen 200 MG tablet Commonly known as: ADVIL   losartan 100 MG tablet Commonly known as: COZAAR       TAKE these medications      Indication  albuterol 108 (90 Base) MCG/ACT inhaler Commonly known as: VENTOLIN HFA Inhale 2 puffs into the lungs every 4 (four) hours as needed for wheezing or shortness of breath. What changed: when to take this  Indication: Asthma, Chronic Obstructive Lung Disease   ALPRAZolam 1 MG tablet Commonly known as: XANAX Take 1 tablet (1 mg total) by mouth 3 (three) times daily. What changed:  when to take this reasons to take this additional instructions Another medication with the same name was removed. Continue taking this medication, and follow the directions you see here.  Indication: Feeling Anxious   amLODipine 10 MG tablet Commonly known as: NORVASC Take 1 tablet (10 mg total) by mouth daily. Start taking on: December 10, 2022 What changed: how much to take  Indication: High Blood Pressure Disorder   atorvastatin 40 MG tablet Commonly known as: LIPITOR Take 1 tablet (40 mg total) by mouth daily. Start taking on: December 10, 2022 What changed: how much to take   Indication: High Amount of Fats in the Blood   carvedilol 3.125 MG tablet Commonly known as: COREG Take  1 tablet (3.125 mg total) by mouth 2 (two) times daily with a meal. What changed: how much to take  Indication: High Blood Pressure Disorder   FLUoxetine 20 MG capsule Commonly known as: PROZAC Take 3 capsules (60 mg total) by mouth daily.  Indication: Depression, Obsessive Compulsive Disorder         Follow-up recommendations:  Other:  Continue current medicine including cardiac medicine.  Follow-up with usual outpatient provider.  Do not drink alcohol with medication.  Comments: Patient agrees to plan.  Prescriptions provided.  Signed: Mordecai Rasmussen, MD 12/09/2022, 10:48 AM

## 2022-12-09 NOTE — Plan of Care (Signed)

## 2022-12-09 NOTE — BHH Counselor (Signed)
Adult Comprehensive Assessment  Patient ID: Kimberly Adkins, female   DOB: 1956/08/19, 67 y.o.   MRN: [id number]   Summary/Recommendations:   Summary and Recommendations (to be completed by the evaluator): 67 y/o female w/ dx of MDD recurrent severem w/ out psychotic features [place] w/ no listed insurance admitted following suicide attempt. Per patient, states she has experienced considerable loss to include spouse amoung other family members. Attempted suicide by overdosing on Prozac and Xanax while intoxicated on wine coolers. Has past hx of MDD, though no prior admissions per chart reivew. Patient adamently states she will not attempt suicide again and wishes to discharge on this day in order to get her affairs back in order. Patient lists her two sisters as supportive of her mental health and general well being. Plans on staying at a hotel her sister manages after discharge. Physican intends on discharging patient on this day.  Patient presents as calm, cooperative, and polite. Affect is Euthymic, congruent with mood and context. Appearance is relatively WNL. Speech volume, speed, and content is WNL. No evidence of memory or concentration impairment. Patient oriented to person, place, time, and situation. Currently denies SI, HI, AVH. No evidence of psychotic features present.    Patient is seen by Ludwig Clarks, PA at [facility] outpatient [facility] on [facility]; patient has a upcoming appointment on 4/2 at 1500. Agrees for [facility] to share medical records with all outpatient providers. Patient to continue working towards treatment goals after discharge. Patient no longer meets criteria for inpatient criteria per attending physician. Continue taking medications as prescribed, nursing to provide instructions at discharge. Follow up with all scheduled appointments.   Durenda Hurt. 12/09/2022

## 2022-12-09 NOTE — BHH Suicide Risk Assessment (Signed)
[facility] INPATIENT:  Family/Significant Other Suicide Prevention Education  Suicide Prevention Education:  Patient Refusal for Family/Significant Other Suicide Prevention Education: The patient Kimberly Adkins has refused to provide written consent for family/significant other to be provided Family/Significant Other Suicide Prevention Education during admission and/or prior to discharge.  Physician notified.  Durenda Hurt 12/09/2022, 10:23 AM

## 2022-12-09 NOTE — Group Note (Signed)
Recreation Therapy Group Note   Group Topic:Problem Solving  Group Date: 12/09/2022 Start Time: 1000 End Time: 1100 Facilitators: Leona Carry, CTRS Location:  Craft Room  Group Description: Life Boat. Patients were given the scenario that they are on a boat that is about to become shipwrecked, leaving them stranded on an [place]. They are asked to make a list of 15 different items that they want to take with them when they are stranded on the [place]. Patients are asked to rank their items from most important to least important, #1 being the most important and #15 being the least. Patients will work individually for the first round to come up with 15 items and then pair up with a peer(s) to condense their list and come up with one list of 15 items between the two of them. Patients or LRT will read aloud the 15 different items to the group after each round. LRT facilitated post-activity processing to discuss how this activity can be used in daily life post discharge.   Affect/Mood: N/A   Participation Level: Did not attend    Clinical Observations/Individualized Feedback: Kimberly Adkins did not attend group due to being on isolation precautions.   Plan: Continue to engage patient in RT group sessions 2-3x/week.   Vilma Prader, LRT, CTRS 12/09/2022 11:35 AM

## 2022-12-09 NOTE — [facility] Suicide Risk Assessment (Signed)
[facility] Discharge Suicide Risk Assessment   Principal Problem: MDD (major depressive disorder), recurrent severe, without psychosis ([place]) Discharge Diagnoses: Principal Problem:   MDD (major depressive disorder), recurrent severe, without psychosis ([place])   Total Time spent with patient: 30 minutes  Musculoskeletal: Strength & Muscle Tone: within normal limits Gait & Station: normal Patient leans: N/A  Psychiatric Specialty Exam  Presentation  General Appearance: No data recorded Eye Contact:No data recorded Speech:No data recorded Speech Volume:No data recorded Handedness:No data recorded  Mood and Affect  Mood:No data recorded Duration of Depression Symptoms: No data recorded Affect:No data recorded  Thought Process  Thought Processes:No data recorded Descriptions of Associations:No data recorded Orientation:No data recorded Thought Content:No data recorded History of Schizophrenia/Schizoaffective disorder:No data recorded Duration of Psychotic Symptoms:No data recorded Hallucinations:No data recorded Ideas of Reference:No data recorded Suicidal Thoughts:No data recorded Homicidal Thoughts:No data recorded  Sensorium  Memory:No data recorded Judgment:No data recorded Insight:No data recorded  Executive Functions  Concentration:No data recorded Attention Span:No data recorded Recall:No data recorded Fund of Knowledge:No data recorded Language:No data recorded  Psychomotor Activity  Psychomotor Activity:No data recorded  Assets  Assets:No data recorded  Sleep  Sleep:No data recorded  Physical Exam: Physical Exam Vitals and nursing note reviewed.  Constitutional:      Appearance: Normal appearance.  HENT:     Head: Normocephalic and atraumatic.     Mouth/Throat:     Pharynx: Oropharynx is clear.  Eyes:     Pupils: Pupils are equal, round, and reactive to light.  Cardiovascular:     Rate and Rhythm: Normal rate and regular rhythm.  Pulmonary:      Effort: Pulmonary effort is normal.     Breath sounds: Normal breath sounds.  Abdominal:     General: Abdomen is flat.     Palpations: Abdomen is soft.  Musculoskeletal:        General: Normal range of motion.  Skin:    General: Skin is warm and dry.  Neurological:     General: No focal deficit present.     Mental Status: She is alert. Mental status is at baseline.  Psychiatric:        Attention and Perception: Attention normal.        Mood and Affect: Mood normal.        Speech: Speech normal.        Behavior: Behavior normal.        Thought Content: Thought content normal.        Cognition and Memory: Cognition normal.    Review of Systems  Constitutional: Negative.   HENT: Negative.    Eyes: Negative.   Respiratory: Negative.    Cardiovascular: Negative.   Gastrointestinal: Negative.   Musculoskeletal: Negative.   Skin: Negative.   Neurological: Negative.   Psychiatric/Behavioral: Negative.     Blood pressure 135/72, pulse 76, temperature 98.9 F (37.2 C), temperature source Oral, resp. rate 18, height '5\' 2"'$  (1.575 m), weight 90.9 kg, SpO2 98 %. Body mass index is 36.65 kg/m.  Mental Status Per Nursing Assessment::   On Admission:  NA  Demographic Factors:  Divorced or widowed, Caucasian, and Unemployed  Loss Factors: Loss of significant relationship  Historical Factors: Impulsivity  Risk Reduction Factors:   Responsible for children under 16 years of age, Sense of responsibility to family, Religious beliefs about death, Positive social support, and Positive therapeutic relationship  Continued Clinical Symptoms:  Depression:   Impulsivity Obsessive-Compulsive Disorder  Cognitive Features That Contribute To Risk:  None    Suicide Risk:  Minimal: No identifiable suicidal ideation.  Patients presenting with no risk factors but with morbid ruminations; may be classified as minimal risk based on the severity of the depressive symptoms    Plan Of  Care/Follow-up recommendations:  Other:  Patient has not displayed any dangerous behavior in the hospital and has been cooperative with treatment.  She absolutely denies any suicidal ideation.  No evidence of psychosis.  Endorses a positive plan to continue medication and follow-up with outpatient mental health treatment.  Counseling supplied about the dangers of mixing alcohol with her prescription medicine and she agrees that she will go back to not drinking.  Agrees to outpatient follow-up and has a safe place to stay.  Alethia Berthold, MD 12/09/2022, 10:44 AM

## 2022-12-09 NOTE — Progress Notes (Signed)
  [facility] Adult Case Management Discharge Plan :  Will you be returning to the same living situation after discharge:  No. Patient to discharge to hotel her sister manages.  At discharge, do you have transportation home?: Yes,  Patient's sister to provide transportation from hospital  Do you have the ability to pay for your medications: No. Patient has no listed insurance, to be provided with 7 day supply of medication at discharge. CSW has provided patient with clinic information for free/reduced medications.   Release of information consent forms completed and in the chart;  Patient's signature needed at discharge.  Patient to Follow up at:  Follow-up [place] at [facility] Follow up on 01/14/2023.   Specialty: Behavioral Health Why: Please present for scheduled appointment on 4/2 at 1500 with [place], [place]. Contact information: [place] [id number] [place] [phone]                Next level of care provider has access to [place] and Suicide Prevention discussed: Yes,  SPE completed with patient, declined consent for CSW to reach family/friend.    Has patient been referred to the Quitline?: Patient refused referral Tobacco Use: High Risk (12/09/2022)   Patient History    Smoking Tobacco Use: Every Day    Smokeless Tobacco Use: Never    Passive Exposure: Not on file   Patient has been referred for addiction treatment: N/A Patient denies active substance use, UDS Negative for all, screened low risk during nursing admission (see [facility] quick tab).  Social History   Substance and Sexual Activity  Alcohol Use Yes   Social History   Substance and Sexual Activity  Drug Use No   Larose Kells 12/09/2022, 10:57 AM

## 2023-01-09 ENCOUNTER — Other Ambulatory Visit (HOSPITAL_COMMUNITY): Payer: Self-pay | Admitting: Physician Assistant

## 2023-01-09 ENCOUNTER — Telehealth (HOSPITAL_COMMUNITY): Payer: Self-pay | Admitting: Physician Assistant

## 2023-01-09 DIAGNOSIS — F411 Generalized anxiety disorder: Secondary | ICD-10-CM

## 2023-01-09 DIAGNOSIS — F1320 Sedative, hypnotic or anxiolytic dependence, uncomplicated: Secondary | ICD-10-CM

## 2023-01-09 DIAGNOSIS — F330 Major depressive disorder, recurrent, mild: Secondary | ICD-10-CM

## 2023-01-09 MED ORDER — FLUOXETINE HCL 20 MG PO CAPS: 60.0000 mg | ORAL_CAPSULE | Freq: Every day | ORAL | 0 refills | Status: DC

## 2023-01-09 MED ORDER — ALPRAZOLAM 1 MG PO TABS: 1.0000 mg | ORAL_TABLET | Freq: Three times a day (TID) | ORAL | 0 refills | Status: DC

## 2023-01-09 NOTE — Progress Notes (Signed)
Provider was contacted by Nicola Girt regarding patient's request to be provided a bridge on her medications until her next appointment.  Provider to provide a 30-day supply of patient's Xanax and Prozac.  Patient's next appointment with this provider scheduled for 01/14/2023.  Patient's medications to be e-prescribed to pharmacy of choice.

## 2023-01-09 NOTE — Telephone Encounter (Signed)
Message acknowledged and reviewed.

## 2023-01-14 ENCOUNTER — Encounter (HOSPITAL_COMMUNITY): Payer: Self-pay | Admitting: Physician Assistant

## 2023-01-14 ENCOUNTER — Telehealth (INDEPENDENT_AMBULATORY_CARE_PROVIDER_SITE_OTHER): Payer: No Payment, Other | Admitting: Physician Assistant

## 2023-01-14 DIAGNOSIS — F330 Major depressive disorder, recurrent, mild: Secondary | ICD-10-CM | POA: Diagnosis not present

## 2023-01-14 DIAGNOSIS — F411 Generalized anxiety disorder: Secondary | ICD-10-CM

## 2023-01-14 MED ORDER — FLUOXETINE HCL 20 MG PO CAPS: 60.0000 mg | ORAL_CAPSULE | Freq: Every day | ORAL | 2 refills | Status: DC

## 2023-01-14 MED ORDER — ALPRAZOLAM 1 MG PO TABS: 1.0000 mg | ORAL_TABLET | Freq: Three times a day (TID) | ORAL | 0 refills | Status: DC
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
## 2023-02-06 ENCOUNTER — Telehealth (HOSPITAL_COMMUNITY): Payer: No Payment, Other | Admitting: Physician Assistant

## 2023-02-08 ENCOUNTER — Other Ambulatory Visit: Payer: Self-pay

## 2023-02-08 ENCOUNTER — Encounter: Payer: Self-pay | Admitting: Family

## 2023-02-08 ENCOUNTER — Ambulatory Visit: Payer: Self-pay | Attending: Family | Admitting: Family

## 2023-02-08 VITALS — BP 121/69 | HR 70 | Resp 16 | Wt 191.2 lb

## 2023-02-08 DIAGNOSIS — Z8639 Personal history of other endocrine, nutritional and metabolic disease: Secondary | ICD-10-CM

## 2023-02-08 DIAGNOSIS — E785 Hyperlipidemia, unspecified: Secondary | ICD-10-CM

## 2023-02-08 DIAGNOSIS — I10 Essential (primary) hypertension: Secondary | ICD-10-CM

## 2023-02-08 MED ORDER — AMLODIPINE BESYLATE 10 MG PO TABS
10.0000 mg | ORAL_TABLET | Freq: Every day | ORAL | 1 refills | Status: DC
  Filled 2023-02-08: qty 90, 90d supply, fill #0

## 2023-02-08 MED ORDER — CARVEDILOL 3.125 MG PO TABS
3.1250 mg | ORAL_TABLET | Freq: Two times a day (BID) | ORAL | 3 refills | Status: DC
  Filled 2023-02-08: qty 60, 30d supply, fill #0

## 2023-02-08 MED ORDER — ATORVASTATIN CALCIUM 40 MG PO TABS
40.0000 mg | ORAL_TABLET | Freq: Every day | ORAL | 1 refills | Status: DC
  Filled 2023-02-08: qty 90, 90d supply, fill #0

## 2023-02-08 NOTE — Patient Instructions (Signed)
1) Lab today  2) I refilled your medications, please take those as prescribed 3) Stay hydrated and eat plant based diet 4) Follow up in 3 months with your PCP 5) Report new symptoms to the clinic or local ED

## 2023-02-08 NOTE — Progress Notes (Signed)
Patient has been counseled on age-appropriate routine health concerns for screening and prevention. These are reviewed and up-to-date. Referrals have been placed accordingly. Immunizations are up-to-date or declined.     Subjective:  Kimberly Adkins is a 67 year old female who presented to the clinic this morning for medications refill.  Patient is requesting her blood pressure and cholesterol medications refilled.  She takes amlodipine 10 mg by mouth once a day atorvastatin 40 milligrams by mouth once a day and Coreg 3.125 mg by mouth twice a day.  Patient reports she has been doing well at home.  She has a past medical history of hypertension, COPD, GERD, dyslipidemia and PTSD among others.  All stable at home.  Patient reports no chest pain, shortness of breath, fever, chills, or any other symptom than intermittent lower back pain, which she does not have today.  Takes Tylenol and ibuprofen for her symptoms. During last visit at the local emergency room on December 07, 2022, lab work showed low potassium of 3.2 mmol/L. No lab repeat recorded.  Review of Systems  Constitutional: Negative.   HENT: Negative.    Respiratory: Negative.    Cardiovascular: Negative.   Gastrointestinal: Negative.   Neurological: Negative.    Past Medical History:  Diagnosis Date   Anxiety    Depression    GERD (gastroesophageal reflux disease)    Hyperlipidemia    Hypertension    Osteoarthritis    PTSD (post-traumatic stress disorder) 2010    Past Surgical History:  Procedure Laterality Date   ANTERIOR AND POSTERIOR REPAIR  2007   BREAST BIOPSY  1997   right; benign   TUBAL LIGATION  1994    Family History  Problem Relation Age of Onset   Breast cancer Paternal Grandmother        Age 2   Colon polyps Paternal Grandmother 35   Colon polyps Maternal Grandfather 15   Cancer Mother        Breast cancer   Breast cancer Mother        Age 57   Coronary artery disease Father    Hypertension Father     Breast cancer Paternal Aunt        Age 94's   Cancer Maternal Grandmother        colon   Heart attack Daughter     Social History Reviewed with no changes to be made today.   Outpatient Medications Prior to Visit  Medication Sig Dispense Refill   albuterol (VENTOLIN HFA) 108 (90 Base) MCG/ACT inhaler Inhale 2 puffs into the lungs every 4 (four) hours as needed for wheezing or shortness of breath. 18 g 1   ALPRAZolam (XANAX) 1 MG tablet Take 1 tablet (1 mg total) by mouth 3 (three) times daily. 90 tablet 0   FLUoxetine (PROZAC) 20 MG capsule Take 3 capsules (60 mg total) by mouth daily. 90 capsule 2   amLODipine (NORVASC) 10 MG tablet Take 1 tablet (10 mg total) by mouth daily. 30 tablet 1   atorvastatin (LIPITOR) 40 MG tablet Take 1 tablet (40 mg total) by mouth daily. 30 tablet 1   carvedilol (COREG) 3.125 MG tablet Take 1 tablet (3.125 mg total) by mouth 2 (two) times daily with a meal. 60 tablet 1   No facility-administered medications prior to visit.    Allergies  Allergen Reactions   Hydromorphone Itching       Objective:    BP 121/69   Pulse 70   Resp 16  Wt 191 lb 3.2 oz (86.7 kg)   SpO2 96%   BMI 34.97 kg/m  Wt Readings from Last 3 Encounters:  02/08/23 191 lb 3.2 oz (86.7 kg)  01/01/21 200 lb 6.4 oz (90.9 kg)  04/28/20 204 lb 3.2 oz (92.6 kg)   Physical Exam Constitutional:      Appearance: Normal appearance.  HENT:     Head: Normocephalic and atraumatic.     Right Ear: Tympanic membrane normal.     Left Ear: Tympanic membrane normal.  Eyes:     Pupils: Pupils are equal, round, and reactive to light.  Cardiovascular:     Rate and Rhythm: Normal rate and regular rhythm.  Pulmonary:     Effort: Pulmonary effort is normal.     Breath sounds: Normal breath sounds.  Abdominal:     General: Bowel sounds are normal.  Musculoskeletal:        General: Normal range of motion.     Cervical back: Normal range of motion and neck supple.  Skin:    General:  Skin is warm.  Neurological:     Mental Status: She is alert and oriented to person, place, and time.  Psychiatric:        Mood and Affect: Mood normal.        Behavior: Behavior normal.     Assessment & Plan:  Goar is a 68 year old female who presented to the clinic for medications refill.  She is taking blood pressure medication cholesterol medication as well as anxiety medication.  During last visit at the local ER her potassium was found to be low at 3.2, no lab repeat recorded.  She is stable at home vital signs stable reports no chest pain or shortness of breath today.   Essential Hypertension:  -Amlodipine 10 mg to take 1 tablet by mouth once a day refilled for 90 days.  -Increase physical activities as tolerated and eat diet low in salt.  -Report new or worsening symptoms to the clinic or local ED.  -Low up with primary care provider in 3 months for routine care.  -Labs today including kidney and liver function test  -Coreg 3.125 mg to take 1 tablet by mouth twice a day, medication refilled.  History of Hypokalemia:  -Lab repeated today including potassium.  -Patient reminded to eat foods high in potassium including bananas and vegetables  -Report new or worsening symptoms to the clinic or local emergency room.  Dyslipidemia:  -Atorvastatin 40 mg tablet, take 1 tablet by mouth once a day.  Medication refilled.  -Patient to follow-up with primary care provider between 3 and 12 months for lab repeat.  -Reminded to eat plant based diet, increase physical activities as tolerated, and cut back on red meats consumption.     Patient has been counseled extensively about nutrition and exercise as well as the importance of adherence with medications and regular follow-up. The patient was given clear instructions to go to ER or return to medical center if symptoms don't improve, worsen or new problems develop. The patient verbalized understanding.    Follow-up: Return in about 3  months (around 05/10/2023).      Eleonore Chiquito, DNP, APRN, FNP-C  [facility] [place], [place] [phone]   02/08/2023, 12:16 PM

## 2023-02-09 LAB — CMP14+EGFR
ALT: 28 IU/L (ref 0–32)
AST: 35 IU/L (ref 0–40)
Albumin/Globulin Ratio: 1.6 (ref 1.2–2.2)
Albumin: 4.1 g/dL (ref 3.9–4.9)
Alkaline Phosphatase: 99 IU/L (ref 44–121)
BUN/Creatinine Ratio: 10 — ABNORMAL LOW (ref 12–28)
BUN: 9 mg/dL (ref 8–27)
Bilirubin Total: 0.3 mg/dL (ref 0.0–1.2)
CO2: 21 mmol/L (ref 20–29)
Calcium: 9 mg/dL (ref 8.7–10.3)
Chloride: 103 mmol/L (ref 96–106)
Creatinine, Ser: 0.92 mg/dL (ref 0.57–1.00)
Globulin, Total: 2.6 g/dL (ref 1.5–4.5)
Glucose: 102 mg/dL — ABNORMAL HIGH (ref 70–99)
Potassium: 5 mmol/L (ref 3.5–5.2)
Sodium: 141 mmol/L (ref 134–144)
Total Protein: 6.7 g/dL (ref 6.0–8.5)
eGFR: 69 mL/min/{1.73_m2} (ref >59)

## 2023-02-10 ENCOUNTER — Other Ambulatory Visit: Payer: Self-pay

## 2023-02-11 ENCOUNTER — Other Ambulatory Visit (HOSPITAL_COMMUNITY): Payer: Self-pay | Admitting: Physician Assistant

## 2023-02-11 ENCOUNTER — Telehealth (HOSPITAL_COMMUNITY): Payer: Self-pay | Admitting: Physician Assistant

## 2023-02-11 ENCOUNTER — Other Ambulatory Visit: Payer: Self-pay

## 2023-02-11 DIAGNOSIS — F330 Major depressive disorder, recurrent, mild: Secondary | ICD-10-CM

## 2023-02-11 DIAGNOSIS — F411 Generalized anxiety disorder: Secondary | ICD-10-CM

## 2023-02-11 MED ORDER — FLUOXETINE HCL 20 MG PO CAPS
60.0000 mg | ORAL_CAPSULE | Freq: Every day | ORAL | 2 refills | Status: DC
  Filled 2023-02-11: qty 90, 30d supply, fill #0

## 2023-02-11 MED ORDER — FLUOXETINE HCL 20 MG PO CAPS: 60.0000 mg | ORAL_CAPSULE | Freq: Every day | ORAL | 2 refills | Status: DC

## 2023-02-11 NOTE — Progress Notes (Signed)
Patient's is requesting refills on her Prozac. Patient's medication to be e-prescribed to pharmacy of choice.

## 2023-02-11 NOTE — Progress Notes (Signed)
Patient request for medication to be sent to a different pharmacy.  Patient's medication to be sent to pharmacy of choice.

## 2023-02-12 ENCOUNTER — Other Ambulatory Visit: Payer: Self-pay

## 2023-02-13 ENCOUNTER — Other Ambulatory Visit: Payer: Self-pay

## 2023-02-14 ENCOUNTER — Other Ambulatory Visit: Payer: Self-pay

## 2023-02-18 NOTE — Telephone Encounter (Signed)
Message acknowledged and reviewed.

## 2023-03-13 ENCOUNTER — Telehealth (HOSPITAL_COMMUNITY): Payer: Self-pay | Admitting: *Deleted

## 2023-03-13 ENCOUNTER — Other Ambulatory Visit (HOSPITAL_COMMUNITY): Payer: Self-pay | Admitting: Physician Assistant

## 2023-03-13 DIAGNOSIS — F411 Generalized anxiety disorder: Secondary | ICD-10-CM

## 2023-03-13 NOTE — Telephone Encounter (Signed)
Patient called for refill of Xanax. Next appointment July 2nd.

## 2023-03-13 NOTE — Telephone Encounter (Signed)
Message acknowledged and reviewed.

## 2023-04-11 ENCOUNTER — Telehealth (HOSPITAL_COMMUNITY): Payer: Self-pay

## 2023-04-11 NOTE — Telephone Encounter (Signed)
Patient is calling for a medication refill. Last appointment was 02/11/2023 and her follow up is on 7/2.

## 2023-04-15 ENCOUNTER — Telehealth (HOSPITAL_COMMUNITY): Payer: No Payment, Other | Admitting: Physician Assistant

## 2023-04-16 ENCOUNTER — Other Ambulatory Visit (HOSPITAL_COMMUNITY): Payer: Self-pay

## 2023-04-16 ENCOUNTER — Telehealth (HOSPITAL_COMMUNITY): Payer: Self-pay | Admitting: Physician Assistant

## 2023-04-16 ENCOUNTER — Other Ambulatory Visit (HOSPITAL_COMMUNITY): Payer: Self-pay | Admitting: Physician Assistant

## 2023-04-16 ENCOUNTER — Other Ambulatory Visit: Payer: Self-pay

## 2023-04-16 DIAGNOSIS — F330 Major depressive disorder, recurrent, mild: Secondary | ICD-10-CM

## 2023-04-16 DIAGNOSIS — F411 Generalized anxiety disorder: Secondary | ICD-10-CM

## 2023-04-16 MED ORDER — ALPRAZOLAM 1 MG PO TABS
1.0000 mg | ORAL_TABLET | Freq: Three times a day (TID) | ORAL | 0 refills | Status: DC
  Filled 2023-04-16: qty 90, 30d supply, fill #0

## 2023-04-16 MED ORDER — FLUOXETINE HCL 20 MG PO CAPS
60.0000 mg | ORAL_CAPSULE | Freq: Every day | ORAL | 2 refills | Status: DC
  Filled 2023-04-16: qty 90, 30d supply, fill #0

## 2023-04-16 NOTE — Telephone Encounter (Signed)
Message acknowledged and reviewed.  Patient's medications sent to pharmacy of choice.

## 2023-04-16 NOTE — Progress Notes (Signed)
Patient's medication was sent to pharmacy of choice.

## 2023-04-16 NOTE — Telephone Encounter (Signed)
Message acknowledged and reviewed.

## 2023-04-18 ENCOUNTER — Other Ambulatory Visit: Payer: Self-pay

## 2023-04-24 ENCOUNTER — Telehealth (HOSPITAL_COMMUNITY): Payer: No Payment, Other | Admitting: Physician Assistant

## 2023-04-24 ENCOUNTER — Encounter (HOSPITAL_COMMUNITY): Payer: Self-pay

## 2023-05-13 ENCOUNTER — Encounter: Payer: Self-pay | Admitting: Nurse Practitioner

## 2023-05-13 ENCOUNTER — Other Ambulatory Visit: Payer: Self-pay

## 2023-05-13 ENCOUNTER — Ambulatory Visit: Payer: Medicare PPO | Attending: Nurse Practitioner | Admitting: Nurse Practitioner

## 2023-05-13 ENCOUNTER — Telehealth (HOSPITAL_COMMUNITY): Payer: Self-pay | Admitting: *Deleted

## 2023-05-13 VITALS — BP 123/74 | HR 86 | Ht 62.0 in | Wt 199.0 lb

## 2023-05-13 DIAGNOSIS — I10 Essential (primary) hypertension: Secondary | ICD-10-CM

## 2023-05-13 DIAGNOSIS — Z1231 Encounter for screening mammogram for malignant neoplasm of breast: Secondary | ICD-10-CM

## 2023-05-13 DIAGNOSIS — F17210 Nicotine dependence, cigarettes, uncomplicated: Secondary | ICD-10-CM | POA: Diagnosis not present

## 2023-05-13 DIAGNOSIS — G8929 Other chronic pain: Secondary | ICD-10-CM

## 2023-05-13 DIAGNOSIS — F411 Generalized anxiety disorder: Secondary | ICD-10-CM

## 2023-05-13 DIAGNOSIS — R7303 Prediabetes: Secondary | ICD-10-CM | POA: Diagnosis not present

## 2023-05-13 DIAGNOSIS — M545 Low back pain, unspecified: Secondary | ICD-10-CM

## 2023-05-13 DIAGNOSIS — Z8639 Personal history of other endocrine, nutritional and metabolic disease: Secondary | ICD-10-CM

## 2023-05-13 DIAGNOSIS — F330 Major depressive disorder, recurrent, mild: Secondary | ICD-10-CM

## 2023-05-13 DIAGNOSIS — F17200 Nicotine dependence, unspecified, uncomplicated: Secondary | ICD-10-CM

## 2023-05-13 DIAGNOSIS — J441 Chronic obstructive pulmonary disease with (acute) exacerbation: Secondary | ICD-10-CM

## 2023-05-13 MED ORDER — FLUOXETINE HCL 20 MG PO CAPS
60.0000 mg | ORAL_CAPSULE | Freq: Every day | ORAL | 1 refills | Status: DC
  Filled 2023-05-13: qty 270, 90d supply, fill #0

## 2023-05-13 MED ORDER — AMLODIPINE BESYLATE 10 MG PO TABS
10.0000 mg | ORAL_TABLET | Freq: Every day | ORAL | 1 refills | Status: DC
  Filled 2023-05-13: qty 90, 90d supply, fill #0

## 2023-05-13 MED ORDER — ATORVASTATIN CALCIUM 40 MG PO TABS
40.0000 mg | ORAL_TABLET | Freq: Every day | ORAL | 1 refills | Status: DC
  Filled 2023-05-13: qty 90, 90d supply, fill #0

## 2023-05-13 MED ORDER — FLUTICASONE-SALMETEROL 100-50 MCG/ACT IN AEPB
1.0000 | INHALATION_SPRAY | Freq: Two times a day (BID) | RESPIRATORY_TRACT | 6 refills | Status: DC
  Filled 2023-05-13: qty 60, 30d supply, fill #0

## 2023-05-13 MED ORDER — CARVEDILOL 3.125 MG PO TABS
3.1250 mg | ORAL_TABLET | Freq: Two times a day (BID) | ORAL | 1 refills | Status: DC
  Filled 2023-05-13: qty 180, 90d supply, fill #0

## 2023-05-13 NOTE — Patient Instructions (Addendum)
[facility] Open ? Closes 5:30?PM Phone: [phone] Address: [address], [place], [place] [address]

## 2023-05-13 NOTE — Progress Notes (Signed)
Assessment & Plan:  Kimberly Adkins was seen today for medical management of chronic issues.  Diagnoses and all orders for this visit:  Primary hypertension -     amLODipine (NORVASC) 10 MG tablet; Take 1 tablet (10 mg total) by mouth daily. -     carvedilol (COREG) 3.125 MG tablet; Take 1 tablet (3.125 mg total) by mouth 2 (two) times daily with a meal. Continue all antihypertensives as prescribed.  Reminded to bring in blood pressure log for follow  up appointment.  RECOMMENDATIONS: DASH/Mediterranean Diets are healthier choices for HTN.    Prediabetes -     Hemoglobin A1c  History of hypokalemia -     atorvastatin (LIPITOR) 40 MG tablet; Take 1 tablet (40 mg total) by mouth daily.  Breast cancer screening by mammogram -     MM 3D SCREENING MAMMOGRAM BILATERAL BREAST; Future  Tobacco dependence -     CT CHEST LUNG CA SCREEN LOW DOSE W/O CM; Future -     fluticasone-salmeterol (ADVAIR) 100-50 MCG/ACT AEPB; Inhale 1 puff into the lungs 2 (two) times daily.  Chronic bilateral low back pain without sciatica -     DG Lumbar Spine Complete; Future Work on losing weight to help reduce back pain. May alternate with heat and ice application for pain relief. May also alternate with acetaminophen and Ibuprofen as prescribed for back pain. Other alternatives include massage, acupuncture and water aerobics.     COPD with acute exacerbation (HCC Well controlled -     fluticasone-salmeterol (ADVAIR) 100-50 MCG/ACT AEPB; Inhale 1 puff into the lungs 2 (two) times daily.     Patient has been counseled on age-appropriate routine health concerns for screening and prevention. These are reviewed and up-to-date. Referrals have been placed accordingly. Immunizations are up-to-date or declined.    Subjective:   Chief Complaint  Patient presents with   Medical Management of Chronic Issues   HPI Kimberly Adkins 67 y.o. female presents to office today for follow up to HTN and prediabetes.   She has  a past medical history of Anxiety, Depression, GERD,   Hyperlipidemia, Hypertension, Osteoarthritis, Depression, and PTSD Patient has been counseled on age-appropriate routine health concerns for screening and prevention. These are reviewed and up-to-date. Referrals have been placed accordingly. Immunizations are up-to-date or declined.    MAMMOGRAM: Overdue. She has been referred to [facility].  PAP Smear: Declines  She sees psychiatry for anxiety and depression. Taking prozac and xanax as prescribed.  HTN Blood pressure is well controlled with amlodipine 10 mg daily and carvedilol 3.125 mg BID BP Readings from Last 3 Encounters:  05/13/23 123/74  02/08/23 121/69  01/01/21 (!) 144/82    Prediabetes Well controlled. She is not prescribed any oral glucose lowering medications at this time.  Lab Results  Component Value Date   HGBA1C 5.8 (H) 12/08/2022    She has chronic back pain. 5 years ago she fell off a conveyor belt at her job.  She has never seen an orthopedist for this.  Taking OTC tylenol which is ineffective. Denies any involuntary loss of urine or stool     Review of Systems  Constitutional:  Negative for fever, malaise/fatigue and weight loss.  HENT: Negative.  Negative for nosebleeds.   Eyes: Negative.  Negative for blurred vision, double vision and photophobia.  Respiratory: Negative.  Negative for cough and shortness of breath.   Cardiovascular: Negative.  Negative for chest pain, palpitations and leg swelling.  Gastrointestinal: Negative.  Negative for heartburn,  nausea and vomiting.  Musculoskeletal:  Positive for back pain and myalgias.  Neurological: Negative.  Negative for dizziness, focal weakness, seizures and headaches.  Psychiatric/Behavioral: Negative.  Negative for suicidal ideas.     Past Medical History:  Diagnosis Date   Anxiety    Depression    GERD (gastroesophageal reflux disease)    Hyperlipidemia    Hypertension    Osteoarthritis    PTSD  (post-traumatic stress disorder) 2010    Past Surgical History:  Procedure Laterality Date   ANTERIOR AND POSTERIOR REPAIR  2007   BREAST BIOPSY  1997   right; benign   TUBAL LIGATION  1994    Family History  Problem Relation Age of Onset   Breast cancer Paternal Grandmother        Age 38   Colon polyps Paternal Grandmother 29   Colon polyps Maternal Grandfather 20   Cancer Mother        Breast cancer   Breast cancer Mother        Age 40   Coronary artery disease Father    Hypertension Father    Breast cancer Paternal Aunt        Age 15's   Cancer Maternal Grandmother        colon   Heart attack Daughter     Social History Reviewed with no changes to be made today.   Outpatient Medications Prior to Visit  Medication Sig Dispense Refill   albuterol (VENTOLIN HFA) 108 (90 Base) MCG/ACT inhaler Inhale 2 puffs into the lungs every 4 (four) hours as needed for wheezing or shortness of breath. 18 g 1   ALPRAZolam (XANAX) 1 MG tablet Take 1 tablet (1 mg total) by mouth 3 (three) times daily. 90 tablet 0   amLODipine (NORVASC) 10 MG tablet Take 1 tablet (10 mg total) by mouth daily. 90 tablet 1   atorvastatin (LIPITOR) 40 MG tablet Take 1 tablet (40 mg total) by mouth daily. 90 tablet 1   carvedilol (COREG) 3.125 MG tablet Take 1 tablet (3.125 mg total) by mouth 2 (two) times daily with a meal. 60 tablet 3   FLUoxetine (PROZAC) 20 MG capsule Take 3 capsules (60 mg total) by mouth daily. 90 capsule 2   No facility-administered medications prior to visit.    Allergies  Allergen Reactions   Hydromorphone Itching       Objective:    BP 123/74 (BP Location: Left Arm, Patient Position: Sitting, Cuff Size: Normal)   Pulse 86   Ht 5\' 2"  (1.575 m)   Wt 199 lb (90.3 kg)   SpO2 95%   BMI 36.40 kg/m  Wt Readings from Last 3 Encounters:  05/13/23 199 lb (90.3 kg)  02/08/23 191 lb 3.2 oz (86.7 kg)  01/01/21 200 lb 6.4 oz (90.9 kg)    Physical Exam Vitals and nursing note  reviewed.  Constitutional:      Appearance: She is well-developed.  HENT:     Head: Normocephalic and atraumatic.  Cardiovascular:     Rate and Rhythm: Normal rate and regular rhythm.     Heart sounds: Normal heart sounds. No murmur heard.    No friction rub. No gallop.  Pulmonary:     Effort: Pulmonary effort is normal. No tachypnea or respiratory distress.     Breath sounds: Normal breath sounds. No decreased breath sounds, wheezing, rhonchi or rales.  Chest:     Chest wall: No tenderness.  Abdominal:     General: Bowel sounds are  normal.     Palpations: Abdomen is soft.  Musculoskeletal:        General: Normal range of motion.     Cervical back: Normal range of motion.  Skin:    General: Skin is warm and dry.  Neurological:     Mental Status: She is alert and oriented to person, place, and time.     Coordination: Coordination normal.  Psychiatric:        Behavior: Behavior normal. Behavior is cooperative.        Thought Content: Thought content normal.        Judgment: Judgment normal.          Patient has been counseled extensively about nutrition and exercise as well as the importance of adherence with medications and regular follow-up. The patient was given clear instructions to go to ER or return to medical center if symptoms don't improve, worsen or new problems develop. The patient verbalized understanding.   Follow-up: Return in about 6 months (around 11/13/2023).   [name], FNP-BC [facility] [place], [place] [phone]   05/29/2023, 8:35 PM

## 2023-05-13 NOTE — Telephone Encounter (Signed)
Message acknowledged and reviewed.

## 2023-05-13 NOTE — Telephone Encounter (Signed)
Patient called asking for refill of Xanax. Next appointment has not been scheduled. She did not want to make one at this time.

## 2023-05-14 ENCOUNTER — Other Ambulatory Visit: Payer: Self-pay

## 2023-05-14 ENCOUNTER — Telehealth: Payer: Self-pay | Admitting: Nurse Practitioner

## 2023-05-14 ENCOUNTER — Other Ambulatory Visit (HOSPITAL_COMMUNITY): Payer: Self-pay | Admitting: Physician Assistant

## 2023-05-14 DIAGNOSIS — F411 Generalized anxiety disorder: Secondary | ICD-10-CM

## 2023-05-14 DIAGNOSIS — F330 Major depressive disorder, recurrent, mild: Secondary | ICD-10-CM

## 2023-05-14 MED ORDER — FLUOXETINE HCL 20 MG PO CAPS: 60.0000 mg | ORAL_CAPSULE | Freq: Every day | ORAL | 0 refills | Status: DC

## 2023-05-14 MED ORDER — ALPRAZOLAM 1 MG PO TABS
1.0000 mg | ORAL_TABLET | Freq: Three times a day (TID) | ORAL | 0 refills | Status: DC
  Filled 2023-05-14: qty 90, 30d supply, fill #0

## 2023-05-14 NOTE — Progress Notes (Signed)
Provider was contacted by [place] regarding patient's request for Xanax refill.  Patient no-showed last appointment, therefore patient's next appointment will be in person scheduled for June 10, 2023.  Provider to bridge patient's medication until next appointment.

## 2023-05-14 NOTE — Telephone Encounter (Signed)
Order faxed.

## 2023-05-14 NOTE — Telephone Encounter (Signed)
Copied from CRM [id number]. Topic: General - Other >> May 14, 2023  2:02 PM Everette C wrote: Reason for CRM: Lesly with [facility] has called to request that the order for the patient's CT lung cancer screening be faxed to their office at [phone]   Please contact further at [phone] if needed

## 2023-05-15 ENCOUNTER — Other Ambulatory Visit: Payer: Self-pay

## 2023-05-26 ENCOUNTER — Encounter: Payer: Self-pay | Admitting: Nurse Practitioner

## 2023-05-29 ENCOUNTER — Encounter: Payer: Self-pay | Admitting: Nurse Practitioner

## 2023-06-02 ENCOUNTER — Inpatient Hospital Stay: Admission: RE | Admit: 2023-06-02 | Payer: Self-pay | Source: Ambulatory Visit

## 2023-06-03 ENCOUNTER — Emergency Department (HOSPITAL_COMMUNITY): Payer: Medicare PPO

## 2023-06-03 ENCOUNTER — Encounter (HOSPITAL_COMMUNITY): Payer: Self-pay | Admitting: Emergency Medicine

## 2023-06-03 ENCOUNTER — Inpatient Hospital Stay (HOSPITAL_COMMUNITY)
Admission: EM | Admit: 2023-06-03 | Discharge: 2023-06-06 | DRG: 071 | Disposition: A | Discharge status: Home / Self Care | Payer: Medicare PPO | Attending: Internal Medicine | Admitting: Internal Medicine

## 2023-06-03 DIAGNOSIS — E785 Hyperlipidemia, unspecified: Secondary | ICD-10-CM | POA: Diagnosis present

## 2023-06-03 DIAGNOSIS — E860 Dehydration: Secondary | ICD-10-CM | POA: Insufficient documentation

## 2023-06-03 DIAGNOSIS — F419 Anxiety disorder, unspecified: Secondary | ICD-10-CM | POA: Diagnosis present

## 2023-06-03 DIAGNOSIS — G8929 Other chronic pain: Secondary | ICD-10-CM | POA: Diagnosis present

## 2023-06-03 DIAGNOSIS — G9340 Encephalopathy, unspecified: Secondary | ICD-10-CM

## 2023-06-03 DIAGNOSIS — F17210 Nicotine dependence, cigarettes, uncomplicated: Secondary | ICD-10-CM | POA: Diagnosis present

## 2023-06-03 DIAGNOSIS — N179 Acute kidney failure, unspecified: Secondary | ICD-10-CM | POA: Diagnosis present

## 2023-06-03 DIAGNOSIS — Z8249 Family history of ischemic heart disease and other diseases of the circulatory system: Secondary | ICD-10-CM | POA: Diagnosis not present

## 2023-06-03 DIAGNOSIS — G9341 Metabolic encephalopathy: Secondary | ICD-10-CM | POA: Diagnosis present

## 2023-06-03 DIAGNOSIS — Z6832 Body mass index (BMI) 32.0-32.9, adult: Secondary | ICD-10-CM

## 2023-06-03 DIAGNOSIS — B9620 Unspecified Escherichia coli [E. coli] as the cause of diseases classified elsewhere: Secondary | ICD-10-CM | POA: Diagnosis present

## 2023-06-03 DIAGNOSIS — E876 Hypokalemia: Secondary | ICD-10-CM | POA: Insufficient documentation

## 2023-06-03 DIAGNOSIS — Z7951 Long term (current) use of inhaled steroids: Secondary | ICD-10-CM

## 2023-06-03 DIAGNOSIS — I10 Essential (primary) hypertension: Secondary | ICD-10-CM | POA: Diagnosis present

## 2023-06-03 DIAGNOSIS — E861 Hypovolemia: Secondary | ICD-10-CM | POA: Diagnosis present

## 2023-06-03 DIAGNOSIS — M545 Low back pain, unspecified: Secondary | ICD-10-CM | POA: Diagnosis present

## 2023-06-03 DIAGNOSIS — M1990 Unspecified osteoarthritis, unspecified site: Secondary | ICD-10-CM | POA: Diagnosis present

## 2023-06-03 DIAGNOSIS — E878 Other disorders of electrolyte and fluid balance, not elsewhere classified: Secondary | ICD-10-CM | POA: Diagnosis present

## 2023-06-03 DIAGNOSIS — F4310 Post-traumatic stress disorder, unspecified: Secondary | ICD-10-CM | POA: Diagnosis present

## 2023-06-03 DIAGNOSIS — K219 Gastro-esophageal reflux disease without esophagitis: Secondary | ICD-10-CM | POA: Diagnosis present

## 2023-06-03 DIAGNOSIS — Z885 Allergy status to narcotic agent status: Secondary | ICD-10-CM

## 2023-06-03 DIAGNOSIS — F1320 Sedative, hypnotic or anxiolytic dependence, uncomplicated: Secondary | ICD-10-CM | POA: Diagnosis present

## 2023-06-03 DIAGNOSIS — E871 Hypo-osmolality and hyponatremia: Secondary | ICD-10-CM | POA: Diagnosis present

## 2023-06-03 DIAGNOSIS — N390 Urinary tract infection, site not specified: Secondary | ICD-10-CM | POA: Insufficient documentation

## 2023-06-03 DIAGNOSIS — R410 Disorientation, unspecified: Secondary | ICD-10-CM | POA: Diagnosis present

## 2023-06-03 DIAGNOSIS — Z79899 Other long term (current) drug therapy: Secondary | ICD-10-CM

## 2023-06-03 DIAGNOSIS — Z5901 Sheltered homelessness: Secondary | ICD-10-CM

## 2023-06-03 DIAGNOSIS — R9431 Abnormal electrocardiogram [ECG] [EKG]: Secondary | ICD-10-CM | POA: Insufficient documentation

## 2023-06-03 DIAGNOSIS — F319 Bipolar disorder, unspecified: Secondary | ICD-10-CM | POA: Diagnosis present

## 2023-06-03 DIAGNOSIS — Z9151 Personal history of suicidal behavior: Secondary | ICD-10-CM

## 2023-06-03 DIAGNOSIS — E669 Obesity, unspecified: Secondary | ICD-10-CM | POA: Diagnosis present

## 2023-06-03 LAB — URINALYSIS, ROUTINE W REFLEX MICROSCOPIC
Bilirubin Urine: NEGATIVE
Glucose, UA: NEGATIVE mg/dL
Ketones, ur: NEGATIVE mg/dL
Leukocytes,Ua: NEGATIVE
Nitrite: NEGATIVE
Protein, ur: 30 mg/dL — AB
Specific Gravity, Urine: 1.009 (ref 1.005–1.030)
pH: 6 (ref 5.0–8.0)

## 2023-06-03 LAB — RAPID URINE DRUG SCREEN, HOSP PERFORMED
Amphetamines: NOT DETECTED
Barbiturates: NOT DETECTED
Benzodiazepines: POSITIVE — AB
Cocaine: NOT DETECTED
Opiates: NOT DETECTED
Tetrahydrocannabinol: NOT DETECTED

## 2023-06-03 LAB — CBC WITH DIFFERENTIAL/PLATELET
Abs Immature Granulocytes: 0.11 10*3/uL — ABNORMAL HIGH (ref 0.00–0.07)
Basophils Absolute: 0.1 10*3/uL (ref 0.0–0.1)
Basophils Relative: 0 %
Eosinophils Absolute: 0 10*3/uL (ref 0.0–0.5)
Eosinophils Relative: 0 %
HCT: 28.8 % — ABNORMAL LOW (ref 36.0–46.0)
Hemoglobin: 9.7 g/dL — ABNORMAL LOW (ref 12.0–15.0)
Immature Granulocytes: 1 %
Lymphocytes Relative: 9 %
Lymphs Abs: 1.5 10*3/uL (ref 0.7–4.0)
MCH: 26.4 pg (ref 26.0–34.0)
MCHC: 33.7 g/dL (ref 30.0–36.0)
MCV: 78.3 fL — ABNORMAL LOW (ref 80.0–100.0)
Monocytes Absolute: 1.1 10*3/uL — ABNORMAL HIGH (ref 0.1–1.0)
Monocytes Relative: 7 %
Neutro Abs: 13.4 10*3/uL — ABNORMAL HIGH (ref 1.7–7.7)
Neutrophils Relative %: 83 %
Platelets: 679 10*3/uL — ABNORMAL HIGH (ref 150–400)
RBC: 3.68 MIL/uL — ABNORMAL LOW (ref 3.87–5.11)
RDW: 15.7 % — ABNORMAL HIGH (ref 11.5–15.5)
WBC: 16.1 10*3/uL — ABNORMAL HIGH (ref 4.0–10.5)
nRBC: 0 % (ref 0.0–0.2)

## 2023-06-03 LAB — COMPREHENSIVE METABOLIC PANEL
ALT: 24 U/L (ref 0–44)
AST: 30 U/L (ref 15–41)
Albumin: 2.7 g/dL — ABNORMAL LOW (ref 3.5–5.0)
Alkaline Phosphatase: 114 U/L (ref 38–126)
Anion gap: 15 (ref 5–15)
BUN: 38 mg/dL — ABNORMAL HIGH (ref 8–23)
CO2: 22 mmol/L (ref 22–32)
Calcium: 8.6 mg/dL — ABNORMAL LOW (ref 8.9–10.3)
Chloride: 90 mmol/L — ABNORMAL LOW (ref 98–111)
Creatinine, Ser: 1.48 mg/dL — ABNORMAL HIGH (ref 0.44–1.00)
GFR, Estimated: 39 mL/min — ABNORMAL LOW (ref >60)
Glucose, Bld: 103 mg/dL — ABNORMAL HIGH (ref 70–99)
Potassium: 2.1 mmol/L — CL (ref 3.5–5.1)
Sodium: 127 mmol/L — ABNORMAL LOW (ref 135–145)
Total Bilirubin: 0.7 mg/dL (ref 0.3–1.2)
Total Protein: 6.7 g/dL (ref 6.5–8.1)

## 2023-06-03 LAB — AMMONIA: Ammonia: <10 umol/L (ref 9–35)

## 2023-06-03 LAB — MAGNESIUM: Magnesium: 1.9 mg/dL (ref 1.7–2.4)

## 2023-06-03 LAB — ETHANOL: Alcohol, Ethyl (B): <10 mg/dL (ref <10)

## 2023-06-03 LAB — CBG MONITORING, ED: Glucose-Capillary: 104 mg/dL — ABNORMAL HIGH (ref 70–99)

## 2023-06-03 MED ORDER — ACETAMINOPHEN 650 MG RE SUPP: 650.0000 mg | Freq: Four times a day (QID) | RECTAL | Status: DC | PRN

## 2023-06-03 MED ORDER — POTASSIUM CHLORIDE 10 MEQ/100ML IV SOLN
10.0000 meq | Freq: Once | INTRAVENOUS | Status: AC
  Administered 2023-06-03: 10 meq via INTRAVENOUS
  Filled 2023-06-03: qty 100

## 2023-06-03 MED ORDER — POTASSIUM CHLORIDE 10 MEQ/100ML IV SOLN
10.0000 meq | Freq: Once | INTRAVENOUS | Status: AC
  Administered 2023-06-03: 10 meq via INTRAVENOUS

## 2023-06-03 MED ORDER — SODIUM CHLORIDE 0.9 % IV BOLUS
1000.0000 mL | Freq: Once | INTRAVENOUS | Status: AC
  Administered 2023-06-03: 1000 mL via INTRAVENOUS

## 2023-06-03 MED ORDER — ACETAMINOPHEN 325 MG PO TABS
650.0000 mg | ORAL_TABLET | Freq: Four times a day (QID) | ORAL | Status: DC | PRN
  Administered 2023-06-03 – 2023-06-04 (×3): 650 mg via ORAL
  Filled 2023-06-03 (×3): qty 2

## 2023-06-03 MED ORDER — POTASSIUM CHLORIDE 10 MEQ/100ML IV SOLN
INTRAVENOUS | Status: AC
  Filled 2023-06-03: qty 100

## 2023-06-03 MED ORDER — OXYCODONE HCL 5 MG PO TABS: 5.0000 mg | ORAL_TABLET | ORAL | Status: DC | PRN

## 2023-06-03 MED ORDER — MOMETASONE FURO-FORMOTEROL FUM 100-5 MCG/ACT IN AERO
2.0000 | INHALATION_SPRAY | Freq: Two times a day (BID) | RESPIRATORY_TRACT | Status: DC
  Administered 2023-06-04 – 2023-06-06 (×5): 2 via RESPIRATORY_TRACT
  Filled 2023-06-03 (×2): qty 8.8

## 2023-06-03 MED ORDER — POTASSIUM CHLORIDE IN NACL 40-0.9 MEQ/L-% IV SOLN
INTRAVENOUS | Status: DC
  Administered 2023-06-05: 100 mL/h via INTRAVENOUS

## 2023-06-03 MED ORDER — SODIUM CHLORIDE 0.9 % IV SOLN
1.0000 g | Freq: Once | INTRAVENOUS | Status: AC
  Administered 2023-06-03: 1 g via INTRAVENOUS
  Filled 2023-06-03: qty 10

## 2023-06-03 MED ORDER — CHLORHEXIDINE GLUCONATE CLOTH 2 % EX PADS
6.0000 | MEDICATED_PAD | Freq: Every day | CUTANEOUS | Status: DC
  Administered 2023-06-04 – 2023-06-06 (×3): 6 via TOPICAL

## 2023-06-03 MED ORDER — LOSARTAN POTASSIUM 50 MG PO TABS
50.0000 mg | ORAL_TABLET | Freq: Every day | ORAL | Status: DC
  Filled 2023-06-03: qty 1

## 2023-06-03 MED ORDER — HEPARIN SODIUM (PORCINE) 5000 UNIT/ML IJ SOLN
5000.0000 [IU] | Freq: Three times a day (TID) | INTRAMUSCULAR | Status: DC
  Administered 2023-06-04 – 2023-06-06 (×7): 5000 [IU] via SUBCUTANEOUS
  Filled 2023-06-03 (×7): qty 1

## 2023-06-03 NOTE — ED Notes (Signed)
Changed brief and placed patient on Bed Pan to obtain urine sample, could not provide at this time.

## 2023-06-03 NOTE — ED Triage Notes (Signed)
Pt arrives with sister who reports pt showed up to her house Thursday sick. Pt would not cooperate for sister and get out of car and sister reports possible UTI. Pt followed commands for RN and got out of car by self. Pt endorses back pain and ovary pain. Denies n/v/d.

## 2023-06-03 NOTE — ED Notes (Signed)
Upon in & out pt had around 200 ml in bladder. Opt then bladder scanned and residual showed .

## 2023-06-03 NOTE — ED Notes (Signed)
Date and time results received: 06/03/23 1955   Test: potassium Critical Value: 2.1  Name of Provider Notified: Hyacinth Meeker  Orders Received? Or Actions Taken?: NA

## 2023-06-03 NOTE — ED Notes (Signed)
Patient transported to CT 

## 2023-06-03 NOTE — ED Provider Notes (Signed)
[place] EMERGENCY DEPARTMENT AT [facility] Provider Note   CSN: [id number] Arrival date & time: 06/03/23  1829     History  Chief Complaint  Patient presents with   Back Pain    MORIREOLUWA HOLLIE is a 67 y.o. female.   Back Pain  This patient is a 67 year old female, she has a history of anxiety on Xanax 1 mg 3 times a day, amlodipine for hypertension, supposed to be taking carvedilol and atorvastatin as well.  She presents in the care of her family member at whose house she showed up at about 5 days ago.  Evidently this patient has been homeless for quite some time.  She had previously been living with her daughter who kicked her out of the house, she then went to live in an empty house after her parents had been placed in rehab for failure to thrive.  She was supposed to take care of them when they came back home, after a couple of days of trying to care for them she left the house, evidently it was full of feces and urine some of which was the patient's, no one was cleaning up after anybody, the patient is otherwise homeless.  She is not able to give much in the way of information as she is very groggy and somnolent but the sister reports that she showed up at her house after driving her car 5 days ago, she was essentially in this condition.  She is complaining of chronic lower back pain, there has been no fevers nausea or vomiting.  She has been taking her own medications and the sister reports that she was able to count her alprazolam, there are 68 tablets left out of 90 after being filled 2 weeks ago.  She does have a history of intentional overdose in February of this year at which time she had been admitted to the hospital.  The patient denies suicidality and according to the sister has not been talking about anything suicidal.  She is concerned because of her progressive delirium and somnolent state and thus took her to the urgent care where she had a urinalysis.  They were  told that the urinalysis was positive for infection but that they would not treat it as she needed to go to the ER for the severity of her symptoms.    Home Medications Prior to Admission medications   Medication Sig Start Date End Date Taking? Authorizing Provider  albuterol (VENTOLIN HFA) 108 (90 Base) MCG/ACT inhaler Inhale 2 puffs into the lungs every 4 (four) hours as needed for wheezing or shortness of breath. 12/09/22   Clapacs, Jackquline Denmark, MD  ALPRAZolam Prudy Feeler) 1 MG tablet Take 1 tablet (1 mg total) by mouth 3 (three) times daily. 05/14/23   Nwoko, Tommas Olp, PA  amLODipine (NORVASC) 10 MG tablet Take 1 tablet (10 mg total) by mouth daily. 05/13/23 08/13/23  [name], NP  atorvastatin (LIPITOR) 40 MG tablet Take 1 tablet (40 mg total) by mouth daily. 05/13/23   [name], NP  atorvastatin (LIPITOR) 40 MG tablet Take 40 mg by mouth daily. 02/16/20   [provider]  carvedilol (COREG) 3.125 MG tablet Take 1 tablet (3.125 mg total) by mouth 2 (two) times daily with a meal. 05/13/23   [name], NP  FLUoxetine (PROZAC) 20 MG capsule Take 3 capsules (60 mg total) by mouth daily. 05/14/23   Nwoko, Tommas Olp, PA  fluticasone-salmeterol (ADVAIR) 100-50 MCG/ACT AEPB  Inhale 1 puff into the lungs 2 (two) times daily. 05/13/23   [name], NP  losartan (COZAAR) 50 MG tablet Take 50 mg by mouth daily. 02/16/20   [provider]      Allergies    Hydromorphone    Review of Systems   Review of Systems  Unable to perform ROS: Mental status change  Musculoskeletal:  Positive for back pain.    Physical Exam Updated Vital Signs BP 130/74   Pulse 75   Temp 97.6 F (36.4 C) (Oral)   Resp (!) 21   Ht 1.575 m (5\' 2" )   Wt 90 kg   SpO2 95%   BMI 36.29 kg/m  Physical Exam Vitals and nursing note reviewed.  Constitutional:      General: She is in acute distress.     Appearance: She is well-developed.  HENT:     Head: Normocephalic and atraumatic.      Mouth/Throat:     Mouth: Mucous membranes are dry.     Pharynx: No oropharyngeal exudate.  Eyes:     General: No scleral icterus.       Right eye: No discharge.        Left eye: No discharge.     Conjunctiva/sclera: Conjunctivae normal.     Pupils: Pupils are equal, round, and reactive to light.  Neck:     Thyroid: No thyromegaly.     Vascular: No JVD.  Cardiovascular:     Rate and Rhythm: Normal rate and regular rhythm.     Heart sounds: Normal heart sounds. No murmur heard.    No friction rub. No gallop.  Pulmonary:     Effort: Pulmonary effort is normal. No respiratory distress.     Breath sounds: Normal breath sounds. No wheezing or rales.  Abdominal:     General: Bowel sounds are normal. There is no distension.     Palpations: Abdomen is soft. There is no mass.     Tenderness: There is no abdominal tenderness.  Musculoskeletal:        General: No tenderness. Normal range of motion.     Cervical back: Normal range of motion and neck supple.     Right lower leg: No edema.     Left lower leg: No edema.  Lymphadenopathy:     Cervical: No cervical adenopathy.  Skin:    General: Skin is warm and dry.     Findings: No erythema or rash.  Neurological:     Mental Status: She is alert.     Coordination: Coordination normal.     Comments: Somnolent, arousable to voice, able to follow commands but diffusely generally weak.  She appears extremely sleepy and has slight slurred speech.  There is no facial droop or asymmetry of the strength of the muscles of the arms or the legs.  Psychiatric:        Behavior: Behavior normal.     ED Results / Procedures / Treatments   Labs (all labs ordered are listed, but only abnormal results are displayed) Labs Reviewed  CBC WITH DIFFERENTIAL/PLATELET - Abnormal; Notable for the following components:      Result Value   WBC 16.1 (*)    RBC 3.68 (*)    Hemoglobin 9.7 (*)    HCT 28.8 (*)    MCV 78.3 (*)    RDW 15.7 (*)    Platelets 679  (*)    Neutro Abs 13.4 (*)    Monocytes Absolute 1.1 (*)  Abs Immature Granulocytes 0.11 (*)    All other components within normal limits  COMPREHENSIVE METABOLIC PANEL - Abnormal; Notable for the following components:   Sodium 127 (*)    Potassium 2.1 (*)    Chloride 90 (*)    Glucose, Bld 103 (*)    BUN 38 (*)    Creatinine, Ser 1.48 (*)    Calcium 8.6 (*)    Albumin 2.7 (*)    GFR, Estimated 39 (*)    All other components within normal limits  URINALYSIS, ROUTINE W REFLEX MICROSCOPIC - Abnormal; Notable for the following components:   Hgb urine dipstick SMALL (*)    Protein, ur 30 (*)    Bacteria, UA FEW (*)    All other components within normal limits  RAPID URINE DRUG SCREEN, HOSP PERFORMED - Abnormal; Notable for the following components:   Benzodiazepines POSITIVE (*)    All other components within normal limits  CBG MONITORING, ED - Abnormal; Notable for the following components:   Glucose-Capillary 104 (*)    All other components within normal limits  AMMONIA  ETHANOL  MAGNESIUM    EKG EKG Interpretation Date/Time:  Tuesday June 03 2023 19:25:10 EDT Ventricular Rate:  81 PR Interval:  133 QRS Duration:  101 QT Interval:  565 QTC Calculation: 656 R Axis:   72  Text Interpretation: Sinus rhythm Nonspecific T abnormalities, lateral leads Prolonged QT interval Confirmed by Eber Hong ([id number]) on 06/03/2023 7:57:33 PM  Radiology No results found.  Procedures .Critical Care  Performed by: Eber Hong, MD Authorized by: Eber Hong, MD   Critical care provider statement:    Critical care time (minutes):  45   Critical care time was exclusive of:  Separately billable procedures and treating other patients and teaching time   Critical care was necessary to treat or prevent imminent or life-threatening deterioration of the following conditions: severe hypokalemia, prolonged QT, acute encephalopathy.   Critical care was time spent personally by me on the  following activities:  Development of treatment plan with patient or surrogate, discussions with consultants, evaluation of patient's response to treatment, examination of patient, obtaining history from patient or surrogate, review of old charts, re-evaluation of patient's condition, pulse oximetry, ordering and review of radiographic studies, ordering and review of laboratory studies and ordering and performing treatments and interventions   Care discussed with: admitting provider   Comments:           Medications Ordered in ED Medications  potassium chloride 10 mEq in 100 mL IVPB (has no administration in time range)  sodium chloride 0.9 % bolus 1,000 mL (0 mLs Intravenous Stopped 06/03/23 2155)  sodium chloride 0.9 % bolus 1,000 mL (0 mLs Intravenous Stopped 06/03/23 2155)  cefTRIAXone (ROCEPHIN) 1 g in sodium chloride 0.9 % 100 mL IVPB (0 g Intravenous Stopped 06/03/23 2051)  potassium chloride 10 mEq in 100 mL IVPB (0 mEq Intravenous Stopped 06/03/23 2123)    ED Course/ Medical Decision Making/ A&P                                 Medical Decision Making Amount and/or Complexity of Data Reviewed Labs: ordered. Radiology: ordered. ECG/medicine tests: ordered.  Risk Prescription drug management. Decision regarding hospitalization.    This patient presents to the ED for concern of altered mental status and back pain, this involves an extensive number of treatment options, and is a complaint that carries  with it a high risk of complications and morbidity.  The differential diagnosis includes urinary tract infection, drug overdose, less likely to be stroke, hypercapnic respiratory failure, substance abuse,   Co morbidities that complicate the patient evaluation  Severe anxiety and depression   Additional history obtained:  Additional history obtained from medical record External records from outside source obtained and reviewed including admission to the hospital in February  2024, the patient is known to have benzodiazepine dependence, still getting prescriptions filled, follows with providers at behavioral health most recently seen in July 2024   Lab Tests:  I Ordered, and personally interpreted labs.  The pertinent results include: Kaseem of 2.1, magnesium of 1.9, liver function is unremarkable, creatinine has gone up from 0.9-1.48 over the last 4 months, the BUN is jumped to 38 suggesting dehydration.  Sodium is low at 127, albumin is low at 2.7, CBC shows a leukocytosis with a thrombocytosis, she is anemic at 9.7, glucose is normal ammonia is negative and drug screen is positive for benzodiazepines, urinalysis essentially negative for infection, culture will be sent   Imaging Studies ordered:  I ordered imaging studies including CT scan of the brain I independently visualized and interpreted imaging which showed no acute stroke or significant abnormalities or hemorrhage I agree with the radiologist interpretation   Cardiac Monitoring: / EKG:  The patient was maintained on a cardiac monitor.  I personally viewed and interpreted the cardiac monitored which showed an underlying rhythm of: Sinus rhythm rate of about 80 bpm, no hypoxia or hypotension   Consultations Obtained:  I requested consultation with the hospitalist,  and discussed lab and imaging findings as well as pertinent plan - they recommend: Admission   Problem List / ED Course / Critical interventions / Medication management  Due to the patient's encephalopathy a workup was initiated including a CT scan as well as laboratory workup and an EKG.  It revealed that the patient was fairly severely hypokalemic with a potassium of 2.1, her EKG did confirm that she had a prolonged QT greater than 600 ms.  This is concerning for developing arrhythmia so she was placed on a cardiac monitor and potassium was repleted.  The process began with 10 mEq IV and was repeated as well as checking a magnesium level,  she also had a CT scan which showed no acute abnormalities.  A chest x-ray will be ordered however the patient has a positive screen for benzodiazepines with a history of overdose on this medication.  She has not needed supplemental oxygen, she has not needed any interventions to maintain a mental status as she does awake and talks to you when you going to the room albeit persistently somnolent.  She is critically ill requiring multiple doses of potassium and close monitoring to potentially avoid arrhythmia.  She will be be admitted to the hospital. I have reviewed the patients home medicines and have made adjustments as needed   Social Determinants of Health:  Known homelessness and drug use including benzodiazepine prescription abuse   Test / Admission - Considered:  Admit to higher level of care         Final Clinical Impression(s) / ED Diagnoses Final diagnoses:  Hypokalemia  Prolonged QT interval  AKI (acute kidney injury) (HCC)  Acute encephalopathy      Eber Hong, MD 06/03/23 2156

## 2023-06-04 DIAGNOSIS — G9341 Metabolic encephalopathy: Secondary | ICD-10-CM

## 2023-06-04 DIAGNOSIS — E876 Hypokalemia: Secondary | ICD-10-CM | POA: Diagnosis not present

## 2023-06-04 DIAGNOSIS — N179 Acute kidney failure, unspecified: Secondary | ICD-10-CM | POA: Diagnosis not present

## 2023-06-04 DIAGNOSIS — F1320 Sedative, hypnotic or anxiolytic dependence, uncomplicated: Secondary | ICD-10-CM | POA: Diagnosis not present

## 2023-06-04 DIAGNOSIS — N390 Urinary tract infection, site not specified: Secondary | ICD-10-CM | POA: Insufficient documentation

## 2023-06-04 DIAGNOSIS — I10 Essential (primary) hypertension: Secondary | ICD-10-CM

## 2023-06-04 DIAGNOSIS — R9431 Abnormal electrocardiogram [ECG] [EKG]: Secondary | ICD-10-CM | POA: Insufficient documentation

## 2023-06-04 DIAGNOSIS — E860 Dehydration: Secondary | ICD-10-CM | POA: Insufficient documentation

## 2023-06-04 LAB — CBC WITH DIFFERENTIAL/PLATELET
Abs Immature Granulocytes: 0.09 10*3/uL — ABNORMAL HIGH (ref 0.00–0.07)
Basophils Absolute: 0.1 10*3/uL (ref 0.0–0.1)
Basophils Relative: 0 %
Eosinophils Absolute: 0.1 10*3/uL (ref 0.0–0.5)
Eosinophils Relative: 1 %
HCT: 28 % — ABNORMAL LOW (ref 36.0–46.0)
Hemoglobin: 9.1 g/dL — ABNORMAL LOW (ref 12.0–15.0)
Immature Granulocytes: 1 %
Lymphocytes Relative: 11 %
Lymphs Abs: 1.6 10*3/uL (ref 0.7–4.0)
MCH: 26 pg (ref 26.0–34.0)
MCHC: 32.5 g/dL (ref 30.0–36.0)
MCV: 80 fL (ref 80.0–100.0)
Monocytes Absolute: 0.9 10*3/uL (ref 0.1–1.0)
Monocytes Relative: 6 %
Neutro Abs: 12.1 10*3/uL — ABNORMAL HIGH (ref 1.7–7.7)
Neutrophils Relative %: 81 %
Platelets: 640 10*3/uL — ABNORMAL HIGH (ref 150–400)
RBC: 3.5 MIL/uL — ABNORMAL LOW (ref 3.87–5.11)
RDW: 15.5 % (ref 11.5–15.5)
WBC: 14.8 10*3/uL — ABNORMAL HIGH (ref 4.0–10.5)
nRBC: 0 % (ref 0.0–0.2)

## 2023-06-04 LAB — BASIC METABOLIC PANEL WITH GFR
Anion gap: 12 (ref 5–15)
Anion gap: 12 (ref 5–15)
BUN: 26 mg/dL — ABNORMAL HIGH (ref 8–23)
BUN: 23 mg/dL (ref 8–23)
CO2: 18 mmol/L — ABNORMAL LOW (ref 22–32)
CO2: 20 mmol/L — ABNORMAL LOW (ref 22–32)
Calcium: 7.9 mg/dL — ABNORMAL LOW (ref 8.9–10.3)
Calcium: 8.5 mg/dL — ABNORMAL LOW (ref 8.9–10.3)
Chloride: 101 mmol/L (ref 98–111)
Chloride: 103 mmol/L (ref 98–111)
Creatinine, Ser: 0.94 mg/dL (ref 0.44–1.00)
Creatinine, Ser: 0.87 mg/dL (ref 0.44–1.00)
GFR, Estimated: >60 mL/min
GFR, Estimated: >60 mL/min
Glucose, Bld: 148 mg/dL — ABNORMAL HIGH (ref 70–99)
Glucose, Bld: 128 mg/dL — ABNORMAL HIGH (ref 70–99)
Potassium: 2.5 mmol/L — CL (ref 3.5–5.1)
Potassium: 2.6 mmol/L — CL (ref 3.5–5.1)
Sodium: 131 mmol/L — ABNORMAL LOW (ref 135–145)
Sodium: 135 mmol/L (ref 135–145)

## 2023-06-04 LAB — COMPREHENSIVE METABOLIC PANEL WITH GFR
ALT: 22 U/L (ref 0–44)
AST: 29 U/L (ref 15–41)
Albumin: 2.3 g/dL — ABNORMAL LOW (ref 3.5–5.0)
Alkaline Phosphatase: 92 U/L (ref 38–126)
Anion gap: 8 (ref 5–15)
BUN: 19 mg/dL (ref 8–23)
CO2: 21 mmol/L — ABNORMAL LOW (ref 22–32)
Calcium: 8 mg/dL — ABNORMAL LOW (ref 8.9–10.3)
Chloride: 104 mmol/L (ref 98–111)
Creatinine, Ser: 0.79 mg/dL (ref 0.44–1.00)
GFR, Estimated: >60 mL/min
Glucose, Bld: 126 mg/dL — ABNORMAL HIGH (ref 70–99)
Potassium: 2.8 mmol/L — ABNORMAL LOW (ref 3.5–5.1)
Sodium: 133 mmol/L — ABNORMAL LOW (ref 135–145)
Total Bilirubin: 0.2 mg/dL — ABNORMAL LOW (ref 0.3–1.2)
Total Protein: 5.8 g/dL — ABNORMAL LOW (ref 6.5–8.1)

## 2023-06-04 LAB — HIV ANTIBODY (ROUTINE TESTING W REFLEX): HIV Screen 4th Generation wRfx: NONREACTIVE

## 2023-06-04 LAB — MRSA NEXT GEN BY PCR, NASAL: MRSA by PCR Next Gen: NOT DETECTED

## 2023-06-04 LAB — MAGNESIUM: Magnesium: 1.7 mg/dL (ref 1.7–2.4)

## 2023-06-04 MED ORDER — POTASSIUM CHLORIDE CRYS ER 20 MEQ PO TBCR
20.0000 meq | EXTENDED_RELEASE_TABLET | Freq: Once | ORAL | Status: AC
  Administered 2023-06-04: 20 meq via ORAL
  Filled 2023-06-04: qty 1

## 2023-06-04 MED ORDER — AMLODIPINE BESYLATE 5 MG PO TABS
10.0000 mg | ORAL_TABLET | Freq: Every day | ORAL | Status: DC
  Filled 2023-06-04: qty 2

## 2023-06-04 MED ORDER — NICOTINE 21 MG/24HR TD PT24
21.0000 mg | MEDICATED_PATCH | Freq: Every day | TRANSDERMAL | Status: DC
  Administered 2023-06-04 – 2023-06-06 (×3): 21 mg via TRANSDERMAL
  Filled 2023-06-04 (×3): qty 1

## 2023-06-04 MED ORDER — AMLODIPINE BESYLATE 5 MG PO TABS
5.0000 mg | ORAL_TABLET | Freq: Every day | ORAL | Status: DC
  Administered 2023-06-04 – 2023-06-06 (×3): 5 mg via ORAL
  Filled 2023-06-04 (×2): qty 1

## 2023-06-04 MED ORDER — MAGNESIUM SULFATE 2 GM/50ML IV SOLN
2.0000 g | Freq: Once | INTRAVENOUS | Status: AC
  Administered 2023-06-04: 2 g via INTRAVENOUS
  Filled 2023-06-04: qty 50

## 2023-06-04 MED ORDER — ADULT MULTIVITAMIN W/MINERALS CH
1.0000 | ORAL_TABLET | Freq: Every day | ORAL | Status: DC
  Administered 2023-06-04 – 2023-06-06 (×3): 1 via ORAL
  Filled 2023-06-04 (×3): qty 1

## 2023-06-04 MED ORDER — SODIUM CHLORIDE 0.9 % IV SOLN
1.0000 g | INTRAVENOUS | Status: DC
  Administered 2023-06-04: 1 g via INTRAVENOUS
  Filled 2023-06-04: qty 10

## 2023-06-04 MED ORDER — ENSURE ENLIVE PO LIQD
237.0000 mL | Freq: Two times a day (BID) | ORAL | Status: DC
  Administered 2023-06-04 (×2): 237 mL via ORAL

## 2023-06-04 MED ORDER — POTASSIUM CHLORIDE CRYS ER 20 MEQ PO TBCR
40.0000 meq | EXTENDED_RELEASE_TABLET | Freq: Once | ORAL | Status: AC
  Administered 2023-06-04: 40 meq via ORAL
  Filled 2023-06-04: qty 2

## 2023-06-04 MED ORDER — OXYCODONE HCL 5 MG PO TABS
5.0000 mg | ORAL_TABLET | Freq: Four times a day (QID) | ORAL | Status: DC | PRN
  Administered 2023-06-04 – 2023-06-05 (×3): 5 mg via ORAL
  Filled 2023-06-04 (×3): qty 1

## 2023-06-04 NOTE — Assessment & Plan Note (Signed)
-  Qtc 656 -Hypokalemia 2.1 -Monitor on tele -Monitor and Replete electrolytes as indicated -Hold QT prolonging agents when possible

## 2023-06-04 NOTE — Plan of Care (Signed)

## 2023-06-04 NOTE — Progress Notes (Signed)
Patient seen and examined; admitted after midnight secondary to altered mental status changes, back pain and increased frequency.  On my examination patient reports no dysuria and expressing general malaise.  After fluid resuscitation she has started feeling better and was oriented x 2.  Hemodynamically stable otherwise.  Electrolytes were found to be severely abnormal and she is actively receiving repletion.  Please refer to H&P written by Dr.[name] for further info/details on admission.  Plan: -Continue electrolyte repletion -Continue telemetry monitoring -Continue IV antibiotics and follow urine culture results. -Continue fluid resuscitation and supportive care. -Will be judicious with any medications that can alter mentation.  Kimberly Loll MD [phone]

## 2023-06-04 NOTE — Assessment & Plan Note (Signed)
-  UA borderline -Acute metabolic encephalopathy  -Leukocytosis 16.1 -Rocephin started in the ED -Urine culture pending -Continue rocephin -Continue to monitor

## 2023-06-04 NOTE — Assessment & Plan Note (Signed)
-   Continue amlodipine -Vital signs stable -Maintain adequate hydration -Heart healthy/low-sodium diet discussed with patient.

## 2023-06-04 NOTE — Assessment & Plan Note (Signed)
-  CT head - No acute intracranial abnormality -Benzos on UDS - unknown if she is taking them more than prescribed -Also possible dehydration and UTI playing a role in patient's altered mentation. -Continue treatment for UTI, replete electrolytes and provide fluid resuscitation -Patient mentation improving and very possible back to baseline.  On today's examination she was just confused to place.

## 2023-06-04 NOTE — Assessment & Plan Note (Signed)
Patient still appears hypovolemic Continue IV fluids

## 2023-06-04 NOTE — Progress Notes (Signed)
Date and time results received: 06/04/23 1810 (use smartphrase ".now" to insert current time)  Test: Potassium Critical Value: 2.6  Name of Provider Notified: Dr. Gwenlyn Perking  Orders Received? Or Actions Taken?:   Dr. Bevely Palmer aware. Awaiting orders.

## 2023-06-04 NOTE — H&P (Signed)
History and Physical    Patient: Kimberly Adkins ZOX:[id number] DOB: March 18, 1956 DOA: 06/03/2023 DOS: the patient was seen and examined on 06/04/2023 PCP: [name], NP  Patient coming from: Home  Chief Complaint:  Chief Complaint  Patient presents with   Back Pain   HPI: Kimberly Adkins is a 67 y.o. female with medical history significant of anxiety, depression, GERD, hyperlipidemia, hypertension, PTSD, presents the ED with a chief complaint of a fall and confusion.  Patient is still confused and not able to provide much history.  When she is first asked why she came into the hospital she says because of a "real bad UTI."  The ED provider reports that they had taken patient to urgent care where she was told that she had a UTI but told that she was too sick to be treated there and that she needed to come into the ER.  When asked what the patient's symptoms actually were she said she could not get off the floor.  When I asked her how she got onto the floor she says she does not know.  When asked if she fell she reports she does not know.  Then she is goes silent when you ask about more details.  She is able to say that she was at her sister's house which seems to be consistent with the story family gave to ER earlier.  Patient denies dysuria, diarrhea, nausea, vomiting but the accuracy of these to review of systems is unknown.  She reports she takes Xanax and Prozac at home.  She thinks she has been taking her Xanax as prescribed.  Her sister manages her Xanax and says that the pill count is correct, but then there was also mention of a possible unaccounted for bottle.  Patient has been hospitalized for Xanax overdose before.  No further history could be obtained at this time.  Patient does smoke a pack per day.  She would like a nicotine patch. Review of Systems: Review of systems as per HPI but accuracy is unknown and the patient's present confused state. Past Medical History:  Diagnosis Date    Anxiety    Depression    GERD (gastroesophageal reflux disease)    Hyperlipidemia    Hypertension    Osteoarthritis    PTSD (post-traumatic stress disorder) 2010   Past Surgical History:  Procedure Laterality Date   ANTERIOR AND POSTERIOR REPAIR  2007   BREAST BIOPSY  1997   right; benign   TUBAL LIGATION  1994   Social History:  reports that she has been smoking cigarettes. She has a 33 pack-year smoking history. She has never used smokeless tobacco. She reports current alcohol use. She reports that she does not use drugs.  Allergies  Allergen Reactions   Hydromorphone Itching    Family History  Problem Relation Age of Onset   Breast cancer Paternal Grandmother        Age 36   Colon polyps Paternal Grandmother 15   Colon polyps Maternal Grandfather 77   Cancer Mother        Breast cancer   Breast cancer Mother        Age 6   Coronary artery disease Father    Hypertension Father    Breast cancer Paternal Aunt        Age 68's   Cancer Maternal Grandmother        colon   Heart attack Daughter     Prior to Admission medications  Medication Sig Start Date End Date Taking? Authorizing Provider  ALPRAZolam Prudy Feeler) 1 MG tablet Take 1 tablet (1 mg total) by mouth 3 (three) times daily. 05/14/23  Yes Nwoko, Tommas Olp, PA  FLUoxetine (PROZAC) 20 MG capsule Take 3 capsules (60 mg total) by mouth daily. 05/14/23  Yes Nwoko, Uchenna E, PA  fluticasone-salmeterol (ADVAIR) 100-50 MCG/ACT AEPB Inhale 1 puff into the lungs 2 (two) times daily. 05/13/23  Yes [name], NP  losartan (COZAAR) 50 MG tablet Take 50 mg by mouth daily. 02/16/20  Yes [provider]  amLODipine (NORVASC) 10 MG tablet Take 1 tablet (10 mg total) by mouth daily. Patient not taking: Reported on 06/03/2023 05/13/23 08/13/23  [name], NP  atorvastatin (LIPITOR) 40 MG tablet Take 1 tablet (40 mg total) by mouth daily. Patient not taking: Reported on 06/03/2023 05/13/23   [name], NP   carvedilol (COREG) 3.125 MG tablet Take 1 tablet (3.125 mg total) by mouth 2 (two) times daily with a meal. Patient not taking: Reported on 06/03/2023 05/13/23   [name], NP    Physical Exam: Vitals:   06/03/23 2321 06/04/23 0409 06/04/23 0458 06/04/23 0500  BP: (!) 124/59 105/84 (!) 123/48 (!) 111/45  Pulse: 81 84 72 78  Resp: 19 19 19  (!) 27  Temp: 97.8 F (36.6 C)     TempSrc: Axillary     SpO2: 97% 96% 95% 96%  Weight: 82.1 kg     Height:       1.  General: Patient lying supine in bed, appears dry, no acute distress   2. Psychiatric: Somnolent and oriented x 3, mood and behavior normal for situation, pleasant and cooperative with exam   3. Neurologic: Speech and language are normal, face is symmetric, moves all 4 extremities voluntarily, at baseline without acute deficits on limited exam   4. HEENMT:  Head is atraumatic, normocephalic, pupils reactive to light, neck is supple, trachea is midline, mucous membranes are dry   5. Respiratory : Lungs are clear to auscultation bilaterally without wheezing, rhonchi, rales, no cyanosis, no increase in work of breathing or accessory muscle use   6. Cardiovascular : Heart rate normal, rhythm is regular, no murmurs, rubs or gallops, no peripheral edema, peripheral pulses palpated   7. Gastrointestinal:  Abdomen is soft, nondistended, nontender to palpation bowel sounds active, no masses or organomegaly palpated   8. Skin:  Skin is warm, dry and intact without rashes, acute lesions, or ulcers on limited exam   9.Musculoskeletal:  No acute deformities or trauma, no asymmetry in tone, no peripheral edema, peripheral pulses palpated, no tenderness to palpation in the extremities  Data Reviewed: In the ED patient is afebrile, heart rate is normal, respiratory rate is normal, blood pressure is unremarkable, maintaining oxygen sats on room air UDS was positive for benzos Patient did have a leukocytosis at 16.1, hyponatremia  127, hypokalemia at 2.1, hypochloremia at 90 AKI with a creatinine of 1.48 CT head showed no acute intracranial abnormality EKG showed a prolonged QT at 656 Admission requested for acute metabolic encephalopathy  Assessment and Plan: * Acute metabolic encephalopathy -CT head - No acute intracranial abnormality -Benzos on UDS - unknown if she is taking them more than prescribed -Also possible UTI -Treat UTI, hydrated with IV fluids, check TSH, replete electrolytes, and allow time for benzos to washout  Dehydration Patient still appears hypovolemic Continue IV fluids  UTI (urinary tract infection) -UA borderline -Acute metabolic encephalopathy  -  Leukocytosis 16.1 -Rocephin started in the ED -Urine culture pending -Continue rocephin -Continue to monitor  Hypokalemia -Potassium 2.1 -Replete and recheck -Continue to monitor  Prolonged QT interval -Qtc 656 -Hypokalemia 2.1 -Monitor on tele -Monitor and Replete electrolytes as indicated -Hold QT prolonging agents when possible  AKI (acute kidney injury) (HCC) -Cr at baseline 0.92 -Today Cr 1.48 -Poor PO intake suspected, given electrolyte deficiencies and altered mental status -Continue IV fluids -Trend in the AM  Benzodiazepine dependence (HCC) -UDS positive -Likely contributing to encephalopathy -Patient is quite somnolent now, if she starts exhibiting signs of withdrawal, will consider adding low dose benzo -Continue to monitor   Hypertension - Although her med rec says she is not taking any medications at home she reports that she is taking her amlodipine - Start amlodipine from home medications      Advance Care Planning:   Code Status: Full Code  Consults: None at this time  Family Communication: No family at bedside  Severity of Illness: The appropriate patient status for this patient is INPATIENT. Inpatient status is judged to be reasonable and necessary in order to provide the required intensity of  service to ensure the patient's safety. The patient's presenting symptoms, physical exam findings, and initial radiographic and laboratory data in the context of their chronic comorbidities is felt to place them at high risk for further clinical deterioration. Furthermore, it is not anticipated that the patient will be medically stable for discharge from the hospital within 2 midnights of admission.   * I certify that at the point of admission it is my clinical judgment that the patient will require inpatient hospital care spanning beyond 2 midnights from the point of admission due to high intensity of service, high risk for further deterioration and high frequency of surveillance required.*  Author: Lilyan Gilford, DO 06/04/2023 5:36 AM  For on call review [email or web address].

## 2023-06-04 NOTE — Assessment & Plan Note (Signed)
-  UDS positive -Likely contributing to encephalopathy -Patient is quite somnolent now, if she starts exhibiting signs of withdrawal, will consider adding low dose benzo -Continue to monitor

## 2023-06-04 NOTE — Progress Notes (Signed)
   06/04/23 1439  TOC Brief Assessment  Insurance and Status Reviewed  Patient has primary care physician Yes  Home environment has been reviewed alone  Prior level of function: independent  Prior/Current Home Services No current home services  Social Determinants of Health Reivew SDOH reviewed no interventions necessary  Readmission risk has been reviewed No  Transition of care needs no transition of care needs at this time   Transition of Care Department [facility]) has reviewed patient and no TOC needs have been identified at this time. We will continue to monitor patient advancement through interdisciplinary progression rounds. If new patient transition needs arise, please place a TOC consult.

## 2023-06-04 NOTE — Assessment & Plan Note (Signed)
-  Cr at baseline 0.92 -Today Cr 1.48 -Poor PO intake suspected, given electrolyte deficiencies and altered mental status -Continue IV fluids -Trend in the AM

## 2023-06-04 NOTE — Assessment & Plan Note (Signed)
-  Potassium 2.1 -Replete and recheck -Continue to monitor

## 2023-06-04 NOTE — Plan of Care (Signed)
  Problem: Education: Goal: Knowledge of General Education information will improve Description: Including pain rating scale, medication(s)/side effects and non-pharmacologic comfort measures Outcome: Progressing   Problem: Health Behavior/Discharge Planning: Goal: Ability to manage health-related needs will improve Outcome: Progressing   Problem: Clinical Measurements: Goal: Ability to maintain clinical measurements within normal limits will improve Outcome: Progressing Goal: Will remain free from infection Outcome: Progressing Goal: Diagnostic test results will improve Outcome: Progressing Goal: Respiratory complications will improve Outcome: Progressing Goal: Cardiovascular complication will be avoided Outcome: Progressing   Problem: Nutrition: Goal: Adequate nutrition will be maintained Outcome: Progressing   Problem: Activity: Goal: Risk for activity intolerance will decrease Outcome: Progressing   Problem: Coping: Goal: Level of anxiety will decrease Outcome: Progressing   Problem: Pain Managment: Goal: General experience of comfort will improve Outcome: Progressing   Problem: Safety: Goal: Ability to remain free from injury will improve Outcome: Progressing   Problem: Skin Integrity: Goal: Risk for impaired skin integrity will decrease Outcome: Progressing

## 2023-06-04 NOTE — Progress Notes (Signed)
Date and time results received: 06/04/23 1147 (use smartphrase ".now" to insert current time)  Test: Potassium Critical Value: 2.5  Name of Provider Notified: Dr. Gwenlyn Perking  Orders Received? Or Actions Taken?: MD notified, awaiting orders

## 2023-06-04 NOTE — Progress Notes (Signed)
Initial Nutrition Assessment  DOCUMENTATION CODES:   Obesity unspecified  INTERVENTION:   -Continue Ensure Enlive po BID, each supplement provides 350 kcal and 20 grams of protein -MVI with minerals daily -Liberalize diet to regular for wider variety of meal selections  NUTRITION DIAGNOSIS:   Increased nutrient needs related to acute illness as evidenced by estimated needs.  GOAL:   Patient will meet greater than or equal to 90% of their needs  MONITOR:   PO intake, Supplement acceptance  REASON FOR ASSESSMENT:   Malnutrition Screening Tool    ASSESSMENT:   Pt with medical history significant of anxiety, depression, GERD, hyperlipidemia, hypertension, PTSD, presents with a chief complaint of a fall and confusion.  Pt admitted with acute metabolic encephalopathy, dehydration, UTI.   Reviewed I/O's: +2.5 L x 24 hours   Pt unavailable at time of visit. Attempted to speak with pt via call to hospital room phone, however, unable to reach. RD unable to obtain further nutrition-related history or complete nutrition-focused physical exam at this time.    Pt currently on a heart healthy diet. No meal completion data available to assess at this time.   Per MD notes, pt is a poor historian secondary to confusion.   Reviewed wt hx; pt has experienced a 5.3% wt loss over the past 3 months, which is not significant for time frame.   Obesity is a complex, chronic medical condition that is optimally managed by a multidisciplinary care team. Weight loss is not an ideal goal for an acute inpatient hospitalization. However, if further work-up for obesity is warranted, consider outpatient referral to [facility].    Medications reviewed and include heparin and 0.9% NaCl with KCl 40 mEq/L infusion @ 100 ml/hr.   Lab Results  Component Value Date   HGBA1C 5.8 (H) 12/08/2022   PTA DM medications are none.   Labs reviewed: Na: 127, K: 2.1,  CBGS: 104 (inpatient orders for glycemic control are none).    Diet Order:   Diet Order             Diet Heart Room service appropriate? Yes; Fluid consistency: Thin  Diet effective now                   EDUCATION NEEDS:   No education needs have been identified at this time  Skin:  Skin Assessment: Reviewed RN Assessment  Last BM:  06/03/23  Height:   Ht Readings from Last 1 Encounters:  06/03/23 5\' 2"  (1.575 m)    Weight:   Wt Readings from Last 1 Encounters:  06/04/23 82.1 kg    Ideal Body Weight:  50 kg  BMI:  Body mass index is 33.1 kg/m.  Estimated Nutritional Needs:   Kcal:  1550-1750  Protein:  85-100 grams  Fluid:  > 1.5 L    Levada Schilling, RD, LDN, CDCES Registered Dietitian II Certified Diabetes Care and Education Specialist Please refer to [facility] for RD and/or RD on-call/weekend/after hours pager

## 2023-06-05 ENCOUNTER — Other Ambulatory Visit: Payer: Self-pay

## 2023-06-05 DIAGNOSIS — G9341 Metabolic encephalopathy: Secondary | ICD-10-CM | POA: Diagnosis not present

## 2023-06-05 DIAGNOSIS — R9431 Abnormal electrocardiogram [ECG] [EKG]: Secondary | ICD-10-CM | POA: Diagnosis not present

## 2023-06-05 DIAGNOSIS — E876 Hypokalemia: Secondary | ICD-10-CM | POA: Diagnosis not present

## 2023-06-05 DIAGNOSIS — N179 Acute kidney failure, unspecified: Secondary | ICD-10-CM | POA: Diagnosis not present

## 2023-06-05 LAB — CBC
HCT: 28.2 % — ABNORMAL LOW (ref 36.0–46.0)
Hemoglobin: 9.1 g/dL — ABNORMAL LOW (ref 12.0–15.0)
MCH: 25.9 pg — ABNORMAL LOW (ref 26.0–34.0)
MCHC: 32.3 g/dL (ref 30.0–36.0)
MCV: 80.1 fL (ref 80.0–100.0)
Platelets: 679 10*3/uL — ABNORMAL HIGH (ref 150–400)
RBC: 3.52 MIL/uL — ABNORMAL LOW (ref 3.87–5.11)
RDW: 15.8 % — ABNORMAL HIGH (ref 11.5–15.5)
WBC: 15.1 10*3/uL — ABNORMAL HIGH (ref 4.0–10.5)
nRBC: 0 % (ref 0.0–0.2)

## 2023-06-05 LAB — BASIC METABOLIC PANEL
Anion gap: 8 (ref 5–15)
BUN: 14 mg/dL (ref 8–23)
CO2: 19 mmol/L — ABNORMAL LOW (ref 22–32)
Calcium: 8 mg/dL — ABNORMAL LOW (ref 8.9–10.3)
Chloride: 108 mmol/L (ref 98–111)
Creatinine, Ser: 0.83 mg/dL (ref 0.44–1.00)
GFR, Estimated: >60 mL/min (ref >60)
Glucose, Bld: 137 mg/dL — ABNORMAL HIGH (ref 70–99)
Potassium: 3 mmol/L — ABNORMAL LOW (ref 3.5–5.1)
Sodium: 135 mmol/L (ref 135–145)

## 2023-06-05 MED ORDER — POTASSIUM CHLORIDE CRYS ER 20 MEQ PO TBCR
30.0000 meq | EXTENDED_RELEASE_TABLET | Freq: Once | ORAL | Status: AC
  Administered 2023-06-05: 30 meq via ORAL
  Filled 2023-06-05: qty 1

## 2023-06-05 MED ORDER — HALOPERIDOL LACTATE 5 MG/ML IJ SOLN
0.6000 mg | Freq: Three times a day (TID) | INTRAMUSCULAR | Status: DC | PRN
  Administered 2023-06-06: 0.6 mg via INTRAVENOUS
  Filled 2023-06-05: qty 1

## 2023-06-05 MED ORDER — ALPRAZOLAM 0.5 MG PO TABS
0.5000 mg | ORAL_TABLET | Freq: Three times a day (TID) | ORAL | Status: DC | PRN
  Administered 2023-06-05 – 2023-06-06 (×3): 0.5 mg via ORAL
  Filled 2023-06-05 (×3): qty 1

## 2023-06-05 MED ORDER — PROSOURCE PLUS PO LIQD
30.0000 mL | Freq: Two times a day (BID) | ORAL | Status: DC
  Administered 2023-06-06: 30 mL via ORAL
  Filled 2023-06-05 (×2): qty 30

## 2023-06-05 MED ORDER — SODIUM CHLORIDE 0.9 % IV SOLN
1.0000 g | INTRAVENOUS | Status: DC
  Administered 2023-06-05: 1 g via INTRAVENOUS
  Filled 2023-06-05: qty 10

## 2023-06-05 NOTE — Progress Notes (Signed)
Progress Note   Patient: Kimberly Adkins:[id number] DOB: 18-May-1956 DOA: 06/03/2023     2 DOS: the patient was seen and examined on 06/05/2023   Brief hospital admission narrative: Please refer to H&P written by Dr.[name] for further info/details on admission.  But briefly, 67 year old female with past medical history significant for bipolar disorder, GERD, hypertension, hyperlipidemia and PTSD; who presented to the hospital secondary to metabolic encephalopathy and UTI.  Found to be significantly dehydrated with acute kidney injury and severe electrolyte abnormalities.  Assessment and Plan: * Acute metabolic encephalopathy -CT head - No acute intracranial abnormality -Benzos on UDS - unknown if she is taking them more than prescribed -Also possible dehydration and UTI playing a role in patient's altered mentation. -Continue treatment for UTI, replete electrolytes and provide fluid resuscitation -Patient mentation improving and very possible back to baseline.  On today's examination she was just confused to place.   Dehydration -Continue fluid resuscitation's -Diet has been advanced and so far tolerating it well.  UTI (urinary tract infection) -Leukocytosis 16.1 at time of admission. -Continue IV Rocephin while waiting on culture result -Continue fluid resuscitation and supportive care.  Hypokalemia -In the setting of decreased oral intake and dehydration -Will provide electrolyte repletion -Follow electrolytes trend -Continue telemetry monitoring.  Prolonged QT interval -Qtc 656 -Continue minimizing medications that could potentially prolong QT even further -Replete electrolytes -Continue telemetry monitoring.  AKI (acute kidney injury) (HCC) -Cr at baseline 0.92 -Peaked Cr 1.48 at time of admission -In the setting of UTI, decreased oral intake and dehydration -Continue to maintain adequate hydration; minimize nephrotoxic agents and follow renal function  trend -Creatinine back to baseline at the moment.  Benzodiazepine dependence (HCC) -UDS positive for benzos -Likely contributing to encephalopathy. -Will continue to be judicious about benzodiazepine usage -Patient mentation improvement after fluid resuscitation and treatment for UTI.    Hypertension - Continue amlodipine -Vital signs stable -Maintain adequate hydration -Heart healthy/low-sodium diet discussed with patient.    Subjective:  Oriented x 2; no chest pain, no nausea or vomiting; no fever.Marland Kitchen  Anxious/restless and intermittently agitated.  Wants to go home.  Physical Exam: Vitals:   06/05/23 1000 06/05/23 1016 06/05/23 1100 06/05/23 1129  BP: (!) 123/51  (!) 122/56   Pulse: 82  94   Resp: 14  19   Temp:    98 F (36.7 C)  TempSrc:    Oral  SpO2: 94% 96% 95%   Weight:      Height:       General exam: Alert, awake, oriented x 2; no chest pain, no nausea, no vomiting.  Afebrile. Respiratory system: Clear to auscultation. Respiratory effort normal.  Good saturation on room air. Cardiovascular system:RRR. No rubs or gallops. Gastrointestinal system: Abdomen is nondistended, soft and nontender. No organomegaly or masses felt. Normal bowel sounds heard. Central nervous system: Alert and oriented. No focal neurological deficits. Extremities: No cyanosis or clubbing. Skin: No petechiae. Psychiatry: Judgement and insight appear normal. Mood & affect appropriate.   Data Reviewed: CBC: WBCs 15.1, hemoglobin 9.1 and platelet count 679K -Basic metabolic panel: Sodium 135, potassium 3.0, chloride 108, bicarb 19, BUN 14, creatinine 0.83 and GFR >60  Family Communication: Sister (Ms. Whitney Post) updated over the phone.  Disposition: Status is: Inpatient Remains inpatient appropriate because: Continue IV antibiotics.    Planned Discharge Destination: Home   Time spent: 50 minutes  Author: [name], MD 06/05/2023 12:17 PM  For on call review [email or web address].

## 2023-06-05 NOTE — Progress Notes (Signed)
Nutrition Follow-up  DOCUMENTATION CODES:   Obesity unspecified  INTERVENTION:   -Continue regular diet -Continue MVI with minerals daily -D/c Ensure -Magic cup TID with meals, each supplement provides 290 kcal and 9 grams of protein  -30 ml Prosource Plus BID, each supplement provides 100 kcals and 15 grams protein -Feeding assistance with meals  NUTRITION DIAGNOSIS:   Increased nutrient needs related to acute illness as evidenced by estimated needs.  Ongoing  GOAL:   Patient will meet greater than or equal to 90% of their needs  Progressing   MONITOR:   PO intake, Supplement acceptance  REASON FOR ASSESSMENT:   Malnutrition Screening Tool    ASSESSMENT:   Pt with medical history significant of anxiety, depression, GERD, hyperlipidemia, hypertension, PTSD, presents with a chief complaint of a fall and confusion.  Reviewed I/O's: +2.1 L x 24 hours +4.6 L since admission  UOP: 350 ml x 24 hours   Per MD notes, CT of head revealed no acute abnormality.   Pt remains on a regular diet. Noted meal completions 15%. She is refusing Ensure supplements.   Pt lying in bed at time of visit. Pt mumbled but unable to understand what she was saying. She did not engage with this RD or answer questions. No family at bedside. Noted lunch tray was untouched.   Reviewed wt hx; pt has experienced a 5.3% wt loss over the past 3 months, which is not significant for time frame.   Per discussion in rounds, possible discharge over the weekend.    Obesity is a complex, chronic medical condition that is optimally managed by a multidisciplinary care team. Weight loss is not an ideal goal for an acute inpatient hospitalization. However, if further work-up for obesity is warranted, consider outpatient referral to [facility].    Medications reviewed and include 0.9% NaCl with KCl 40 mEq/L infusion @ 100 ml/hr.   Labs reviewed: K: 3.0, CBGS: 104.     NUTRITION - FOCUSED PHYSICAL EXAM:  Flowsheet Row Most Recent Value  Orbital Region No depletion  Upper Arm Region No depletion  Thoracic and Lumbar Region No depletion  Buccal Region No depletion  Temple Region No depletion  Clavicle Bone Region No depletion  Clavicle and Acromion Bone Region No depletion  Scapular Bone Region No depletion  Dorsal Hand No depletion  Patellar Region No depletion  Anterior Thigh Region No depletion  Posterior Calf Region Mild depletion  Edema (RD Assessment) Mild  Hair Reviewed  Eyes Reviewed  Mouth Reviewed  Skin Reviewed  Nails Reviewed       Diet Order:   Diet Order             Diet regular Fluid consistency: Thin  Diet effective now                   EDUCATION NEEDS:   No education needs have been identified at this time  Skin:  Skin Assessment: Reviewed RN Assessment  Last BM:  06/03/23  Height:   Ht Readings from Last 1 Encounters:  06/03/23 5\' 2"  (1.575 m)    Weight:   Wt Readings from Last 1 Encounters:  06/05/23 81.2 kg    Ideal Body Weight:  50 kg  BMI:  Body mass index is 32.74 kg/m.  Estimated Nutritional Needs:   Kcal:  1550-1750  Protein:  85-100 grams  Fluid:  > 1.5 L    Levada Schilling, RD, LDN, CDCES Registered Dietitian II  Certified Diabetes Care and Education Specialist Please refer to [facility] for RD and/or RD on-call/weekend/after hours pager

## 2023-06-05 NOTE — Plan of Care (Signed)

## 2023-06-05 NOTE — Care Management Important Message (Signed)
Important Message  Patient Details  Name: Kimberly Adkins MRN: [id number] Date of Birth: October 23, 1955   Medicare Important Message Given:  N/A - LOS <3 / Initial given by admissions     Corey Harold 06/05/2023, 3:12 PM

## 2023-06-06 ENCOUNTER — Other Ambulatory Visit: Payer: Self-pay

## 2023-06-06 DIAGNOSIS — E876 Hypokalemia: Secondary | ICD-10-CM | POA: Diagnosis not present

## 2023-06-06 DIAGNOSIS — R9431 Abnormal electrocardiogram [ECG] [EKG]: Secondary | ICD-10-CM | POA: Diagnosis not present

## 2023-06-06 DIAGNOSIS — N179 Acute kidney failure, unspecified: Secondary | ICD-10-CM | POA: Diagnosis not present

## 2023-06-06 DIAGNOSIS — G9341 Metabolic encephalopathy: Secondary | ICD-10-CM | POA: Diagnosis not present

## 2023-06-06 LAB — BASIC METABOLIC PANEL
Anion gap: 8 (ref 5–15)
BUN: 6 mg/dL — ABNORMAL LOW (ref 8–23)
CO2: 24 mmol/L (ref 22–32)
Calcium: 7.9 mg/dL — ABNORMAL LOW (ref 8.9–10.3)
Chloride: 106 mmol/L (ref 98–111)
Creatinine, Ser: 0.64 mg/dL (ref 0.44–1.00)
GFR, Estimated: >60 mL/min (ref >60)
Glucose, Bld: 117 mg/dL — ABNORMAL HIGH (ref 70–99)
Potassium: 3.3 mmol/L — ABNORMAL LOW (ref 3.5–5.1)
Sodium: 138 mmol/L (ref 135–145)

## 2023-06-06 LAB — URINE CULTURE: Culture: >=100000 — AB

## 2023-06-06 LAB — MAGNESIUM: Magnesium: 1 mg/dL — ABNORMAL LOW (ref 1.7–2.4)

## 2023-06-06 MED ORDER — POTASSIUM CHLORIDE CRYS ER 20 MEQ PO TBCR
40.0000 meq | EXTENDED_RELEASE_TABLET | Freq: Every day | ORAL | Status: DC
  Administered 2023-06-06: 40 meq via ORAL
  Filled 2023-06-06: qty 2

## 2023-06-06 MED ORDER — POTASSIUM CHLORIDE CRYS ER 20 MEQ PO TBCR
20.0000 meq | EXTENDED_RELEASE_TABLET | Freq: Every day | ORAL | 0 refills | Status: DC
  Filled 2023-06-06: qty 20, 20d supply, fill #0

## 2023-06-06 MED ORDER — AMLODIPINE BESYLATE 5 MG PO TABS: 5.0000 mg | ORAL_TABLET | Freq: Every day | ORAL | 2 refills | Status: DC

## 2023-06-06 MED ORDER — NICOTINE 21 MG/24HR TD PT24: 21.0000 mg | MEDICATED_PATCH | Freq: Every day | TRANSDERMAL | 0 refills | Status: DC

## 2023-06-06 MED ORDER — CEFADROXIL 500 MG PO CAPS: 500.0000 mg | ORAL_CAPSULE | Freq: Two times a day (BID) | ORAL | 0 refills | Status: AC

## 2023-06-06 MED ORDER — CEFADROXIL 500 MG PO CAPS
500.0000 mg | ORAL_CAPSULE | Freq: Two times a day (BID) | ORAL | 0 refills | Status: DC
Start: 2023-06-06 — End: 2023-06-06
  Filled 2023-06-06: qty 10, 5d supply, fill #0

## 2023-06-06 MED ORDER — NICOTINE 21 MG/24HR TD PT24
21.0000 mg | MEDICATED_PATCH | Freq: Every day | TRANSDERMAL | 0 refills | Status: DC
  Filled 2023-06-06: qty 28, 28d supply, fill #0

## 2023-06-06 MED ORDER — MAGNESIUM SULFATE 2 GM/50ML IV SOLN
2.0000 g | Freq: Once | INTRAVENOUS | Status: AC
  Administered 2023-06-06: 2 g via INTRAVENOUS
  Filled 2023-06-06: qty 50

## 2023-06-06 MED ORDER — AMLODIPINE BESYLATE 5 MG PO TABS
5.0000 mg | ORAL_TABLET | Freq: Every day | ORAL | 2 refills | Status: DC
  Filled 2023-06-06: qty 30, 30d supply, fill #0

## 2023-06-06 MED ORDER — POTASSIUM CHLORIDE CRYS ER 20 MEQ PO TBCR: 20.0000 meq | EXTENDED_RELEASE_TABLET | Freq: Every day | ORAL | 0 refills | Status: DC

## 2023-06-06 NOTE — Discharge Summary (Signed)
Physician Discharge Summary   Patient: Kimberly Adkins MRN: [id number] DOB: Oct 26, 1955  Admit date:     06/03/2023  Discharge date: 06/06/23  Discharge Physician: [name]   PCP: [name], NP   Recommendations at discharge:  Reassess blood pressure and adjust antihypertensive treatment as needed Repeat basic metabolic panel to follow ultralights renal function Repeat CBC to follow hemoglobin trend/stability.  Discharge Diagnoses: Acute metabolic encephalopathy Hypertension Benzodiazepine dependence (HCC) AKI (acute kidney injury) (HCC) Prolonged QT interval Hypokalemia E. coli UTI (urinary tract infection) Dehydration  Brief hospital admission narrative: Please refer to H&P written by Dr.[name] for further info/details on admission.  But briefly, 67 year old female with past medical history significant for bipolar disorder, GERD, hypertension, hyperlipidemia and PTSD; who presented to the hospital secondary to metabolic encephalopathy and UTI.  Found to be significantly dehydrated with acute kidney injury and severe electrolyte abnormalities.  Assessment and Plan: * Acute metabolic encephalopathy -CT head - No acute intracranial abnormality -Benzos on UDS - unknown if she is taking them more than prescribed -Also possible dehydration and UTI playing a role in patient's altered mentation. -Continue treatment for UTI, replete electrolytes and provide fluid resuscitation -Patient mentation improving and very possible back to baseline.  On today's examination she was just confused to place and expressed wanting to go home; patient became agitated with nursing staff when not allowing her to do what she wants..  Dehydration -Continue to maintain adequate hydration. -Diet has been advanced and so far well-tolerated; no nausea or vomiting.  UTI (urinary tract infection) -Leukocytosis 16.1 at time of admission. -Trending down as expected with adequate hydration and  antibiotic therapy -Repeat CBC at follow-up visit to assess WBCs trend/stability and complete resolution. -No fever, able to tolerate by mouth medications.  Hypokalemia -In the setting of decreased oral intake and dehydration -Electrolytes have been repleted; daily supplementation started -Repeat basic metabolic panel at follow-up visit to assess stability. -Potassium 3.3 at discharge.  Prolonged QT interval -Qtc 656 -Continue minimizing medications that could potentially prolong QT even further -Replete electrolytes  AKI (acute kidney injury) (HCC) -Cr at baseline 0.92 -Peaked Cr 1.48 at time of admission -In the setting of UTI, decreased oral intake and dehydration -Creatinine back to baseline at time of discharge. -Advised to maintain adequate hydration.  Benzodiazepine dependence (HCC) -UDS positive for benzos -Likely contributing to encephalopathy. -Patient has been instructed to be judicious about benzodiazepine usage -Patient mentation improvement after fluid resuscitation and treatment for UTI. -Continue patient follow-up with PCP and if needed outpatient referral to psychiatry service to further adjust her medications.  Hypertension - Continue amlodipine -Vital signs stable -Advised to follow heart healthy diet and to maintain adequate hydration. -Reassess blood pressure at follow-up visit.  Class I obesity -Body mass index is 32.74 kg/m. -Low-calorie diet, portion control and increase physical activity discussed with patient.  Tobacco abuse -Cessation counseling provided -Nicotine patch prescribed at discharge.  Consultants: None Procedures performed: See below for x-ray report. Disposition: Home Diet recommendation: Heart healthy diet.   DISCHARGE MEDICATION: Allergies as of 06/06/2023       Reactions   Hydromorphone Itching        Medication List     STOP taking these medications    carvedilol 3.125 MG tablet Commonly known as: COREG    losartan 50 MG tablet Commonly known as: COZAAR       TAKE these medications    ALPRAZolam 1 MG tablet Commonly known as: XANAX Take 1 tablet (1  mg total) by mouth 3 (three) times daily.   amLODipine 5 MG tablet Commonly known as: NORVASC Take 1 tablet (5 mg total) by mouth daily. What changed:  medication strength how much to take   atorvastatin 40 MG tablet Commonly known as: LIPITOR Take 1 tablet (40 mg total) by mouth daily.   cefadroxil 500 MG capsule Commonly known as: DURICEF Take 1 capsule (500 mg total) by mouth 2 (two) times daily for 5 days.   FLUoxetine 20 MG capsule Commonly known as: PROZAC Take 3 capsules (60 mg total) by mouth daily.   fluticasone-salmeterol 100-50 MCG/ACT Aepb Commonly known as: ADVAIR Inhale 1 puff into the lungs 2 (two) times daily.   nicotine 21 mg/24hr patch Commonly known as: NICODERM CQ - dosed in mg/24 hours Place 1 patch (21 mg total) onto the skin daily. Start taking on: June 07, 2023   potassium chloride SA 20 MEQ tablet Commonly known as: KLOR-CON M Take 1 tablet (20 mEq total) by mouth daily.        Follow-up Information     [name], NP. Schedule an appointment as soon as possible for a visit in 10 day(s).   Specialty: Nurse Practitioner Contact information: [address] [place] [address] [phone]                Discharge Exam: Filed Weights   06/03/23 2321 06/04/23 0500 06/05/23 0432  Weight: 82.1 kg 82.1 kg 81.2 kg   General exam: No fever, no chest pain, no nausea, no vomiting.  Wants to go home. Respiratory system: Clear to auscultation. Respiratory effort normal.  Good saturation on room air. Cardiovascular system:RRR. No rubs or gallops. Gastrointestinal system: Abdomen is obese, nondistended, soft and nontender. No organomegaly or masses felt. Normal bowel sounds heard. Central nervous system: Alert and oriented. No focal neurological deficits. Extremities: No  cyanosis or clubbing. Skin: No petechiae. Psychiatry: Flat affect appreciated; Patient answering questions adequately and demonstrated competency.  Condition at discharge: Stable and improved.  The results of significant diagnostics from this hospitalization (including imaging, microbiology, ancillary and laboratory) are listed below for reference.   Imaging Studies: DG Chest Port 1 View  Result Date: 06/03/2023 CLINICAL DATA:  Altered mental status, back pain EXAM: PORTABLE CHEST 1 VIEW COMPARISON:  11/28/2022 FINDINGS: The heart size and mediastinal contours are within normal limits. Both lungs are clear. The visualized skeletal structures are unremarkable. IMPRESSION: No active disease. Electronically Signed   By: Helyn Numbers M.D.   On: 06/03/2023 22:35   CT Head Wo Contrast  Result Date: 06/03/2023 CLINICAL DATA:  Altered mental status. EXAM: CT HEAD WITHOUT CONTRAST TECHNIQUE: Contiguous axial images were obtained from the base of the skull through the vertex without intravenous contrast. RADIATION DOSE REDUCTION: This exam was performed according to the departmental dose-optimization program which includes automated exposure control, adjustment of the mA and/or kV according to patient size and/or use of iterative reconstruction technique. COMPARISON:  Head CT 11/29/2022 FINDINGS: Brain: No evidence of acute infarction, hemorrhage, hydrocephalus, extra-axial collection or mass lesion/mass effect. There is mild diffuse atrophy. There is mild periventricular white matter hypodensity, likely chronic small vessel ischemic change. 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 Vascular: Atherosclerotic calcifications are present within the cavernous internal carotid arteries. Skull: Normal. Negative for fracture or focal lesion. Sinuses/Orbits: No acute finding. Other: None. IMPRESSION: 1. No acute intracranial process. 2. Mild atrophy and chronic small vessel ischemic changes.  Electronically Signed  By: Darliss Cheney M.D.   On: 06/03/2023 22:10    Microbiology: Results for orders placed or performed during the hospital encounter of 06/03/23  Urine Culture (for pregnant, neutropenic or urologic patients or patients with an indwelling urinary catheter)     Status: Abnormal   Collection Time: 06/03/23 10:23 PM   Specimen: Urine, Catheterized  Result Value Ref Range Status   Specimen Description   Final    URINE, CATHETERIZED Performed at [facility], [address], [place], [place] [address]    Special Requests   Final    NONE Performed at [facility], [address], [place], [place] [address]    Culture >=100,000 COLONIES/mL ESCHERICHIA COLI (A)  Final   Report Status 06/06/2023 FINAL  Final   Organism ID, Bacteria ESCHERICHIA COLI (A)  Final      Susceptibility   Escherichia coli - MIC*    AMPICILLIN >=32 RESISTANT Resistant     CEFAZOLIN 8 SENSITIVE Sensitive     CEFEPIME <=0.12 SENSITIVE Sensitive     CEFTRIAXONE <=0.25 SENSITIVE Sensitive     CIPROFLOXACIN <=0.25 SENSITIVE Sensitive     GENTAMICIN <=1 SENSITIVE Sensitive     IMIPENEM <=0.25 SENSITIVE Sensitive     NITROFURANTOIN <=16 SENSITIVE Sensitive     TRIMETH/SULFA <=20 SENSITIVE Sensitive     AMPICILLIN/SULBACTAM >=32 RESISTANT Resistant     PIP/TAZO 8 SENSITIVE Sensitive     * >=100,000 COLONIES/mL ESCHERICHIA COLI  MRSA Next Gen by PCR, Nasal     Status: None   Collection Time: 06/03/23 11:54 PM   Specimen: Nasal Mucosa; Nasal Swab  Result Value Ref Range Status   MRSA by PCR Next Gen NOT DETECTED NOT DETECTED Final    Comment: (NOTE) The GeneXpert MRSA Assay (FDA approved for NASAL specimens only), is one component of a comprehensive MRSA colonization surveillance program. It is not intended to diagnose MRSA infection nor to guide or monitor treatment for MRSA infections. Test performance is not FDA approved in patients less than 108 years old. Performed at [facility], [address] [place], [place] [address]     Labs: CBC: Recent Labs  Lab 06/03/23 1927 06/04/23 0454 06/05/23 0443  WBC 16.1* 14.8* 15.1*  NEUTROABS 13.4* 12.1*  --   HGB 9.7* 9.1* 9.1*  HCT 28.8* 28.0* 28.2*  MCV 78.3* 80.0 80.1  PLT 679* 640* 679*   Basic Metabolic Panel: Recent Labs  Lab 06/03/23 1927 06/04/23 0454 06/04/23 1129 06/04/23 1727 06/04/23 2114 06/05/23 0443 06/06/23 0439  NA 127*  --  131* 135 133* 135 138  K 2.1*  --  2.5* 2.6* 2.8* 3.0* 3.3*  CL 90*  --  101 103 104 108 106  CO2 22  --  18* 20* 21* 19* 24  GLUCOSE 103*  --  148* 128* 126* 137* 117*  BUN 38*  --  26* 23 19 14  6*  CREATININE 1.48*  --  0.94 0.87 0.79 0.83 0.64  CALCIUM 8.6*  --  7.9* 8.5* 8.0* 8.0* 7.9*  MG 1.9 1.7  --   --   --   --  1.0*   Liver Function Tests: Recent Labs  Lab 06/03/23 1927 06/04/23 2114  AST 30 29  ALT 24 22  ALKPHOS 114 92  BILITOT 0.7 0.2*  PROT 6.7 5.8*  ALBUMIN 2.7* 2.3*   CBG: Recent Labs  Lab 06/03/23 1953  GLUCAP 104*    Discharge time spent: greater than 30 minutes.  Signed: [name],  MD [facility] 06/06/2023

## 2023-06-06 NOTE — Progress Notes (Addendum)
[id number] Patient being cleaned up by RN, unavailable for MDI at this time.

## 2023-06-06 NOTE — Plan of Care (Signed)
  Problem: Education: Goal: Knowledge of General Education information will improve Description: Including pain rating scale, medication(s)/side effects and non-pharmacologic comfort measures Outcome: Not Met (add Reason)   Problem: Health Behavior/Discharge Planning: Goal: Ability to manage health-related needs will improve Outcome: Not Met (add Reason)   Problem: Clinical Measurements: Goal: Ability to maintain clinical measurements within normal limits will improve Outcome: Not Met (add Reason)   Problem: Activity: Goal: Risk for activity intolerance will decrease Outcome: Not Met (add Reason)   Problem: Nutrition: Goal: Adequate nutrition will be maintained Outcome: Not Met (add Reason)

## 2023-06-09 ENCOUNTER — Telehealth: Payer: Self-pay

## 2023-06-09 NOTE — Transitions of Care (Post Inpatient/ED Visit) (Signed)
06/09/2023  Name: MELAINIE ALPAUGH MRN: [id number] DOB: 1956/04/14  Today's TOC FU Call Status: Today's TOC FU Call Status:: Unsuccessful Call (1st Attempt) Unsuccessful Call (1st Attempt) Date: 06/09/23  Attempted to reach the patient regarding the most recent Inpatient/ED visit.  Follow Up Plan: Additional outreach attempts will be made to reach the patient to complete the Transitions of Care (Post Inpatient/ED visit) call.   Signature  Robyne Peers, RN

## 2023-06-10 ENCOUNTER — Encounter (HOSPITAL_COMMUNITY): Payer: Self-pay | Admitting: Physician Assistant

## 2023-06-10 ENCOUNTER — Telehealth: Payer: Self-pay

## 2023-06-10 ENCOUNTER — Ambulatory Visit (HOSPITAL_COMMUNITY): Payer: No Payment, Other | Admitting: Physician Assistant

## 2023-06-10 ENCOUNTER — Other Ambulatory Visit: Payer: Self-pay

## 2023-06-10 DIAGNOSIS — F411 Generalized anxiety disorder: Secondary | ICD-10-CM | POA: Diagnosis not present

## 2023-06-10 DIAGNOSIS — F330 Major depressive disorder, recurrent, mild: Secondary | ICD-10-CM | POA: Diagnosis not present

## 2023-06-10 MED ORDER — ALPRAZOLAM 1 MG PO TABS
1.0000 mg | ORAL_TABLET | Freq: Three times a day (TID) | ORAL | 0 refills | Status: DC
Start: 2023-06-10 — End: 2023-08-10
  Filled 2023-06-10 – 2023-06-24 (×2): qty 90, 30d supply, fill #0

## 2023-06-10 MED ORDER — FLUOXETINE HCL 20 MG PO CAPS
60.0000 mg | ORAL_CAPSULE | Freq: Every day | ORAL | 3 refills | Status: DC
  Filled 2023-06-10: qty 90, 30d supply, fill #0

## 2023-06-10 NOTE — Transitions of Care (Post Inpatient/ED Visit) (Signed)
06/10/2023  Name: Kimberly Adkins MRN: [id number] DOB: [birth year]  Today's TOC FU Call Status: Today's TOC FU Call Status:: Unsuccessful Call (2nd Attempt) Unsuccessful Call (1st Attempt) Date: 06/09/23 Unsuccessful Call (2nd Attempt) Date: 06/10/23  Attempted to reach the patient regarding the most recent Inpatient/ED visit.  Follow Up Plan: Additional outreach attempts will be made to reach the patient to complete the Transitions of Care (Post Inpatient/ED visit) call.   Signature Robyne Peers, RN
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
## 2023-06-11 ENCOUNTER — Other Ambulatory Visit: Payer: Self-pay

## 2023-06-11 ENCOUNTER — Telehealth: Payer: Self-pay

## 2023-06-11 NOTE — Transitions of Care (Post Inpatient/ED Visit) (Signed)
06/11/2023  Name: Kimberly Adkins MRN: [id number] DOB: 02/25/1956  Today's TOC FU Call Status: Today's TOC FU Call Status:: Successful TOC FU Call Completed Unsuccessful Call (1st Attempt) Date: 06/09/23 Unsuccessful Call (2nd Attempt) Date: 06/10/23 [facility] FU Call Complete Date: 06/11/23 Patient's Name and Date of Birth confirmed.  Transition Care Management Follow-up Telephone Call Date of Discharge: 06/06/23 [facility] Type of Discharge: Inpatient Admission Primary Inpatient Discharge Diagnosis:: acute metabolic encephalopathy How have you been since you were released from the hospital?: Better Any questions or concerns?: No  Items Reviewed: Did you receive and understand the discharge instructions provided?: No (She said that the hospital did not give her any instructions) Medications obtained,verified, and reconciled?: No Medications Not Reviewed Reasons:: Other: (She said she has all of her medications and did not need to review the med list and did not have any questions about the med regime,) Any new allergies since your discharge?: No Dietary orders reviewed?: Yes Type of Diet Ordered:: heart healthy Do you have support at home?: Yes People in Home: sibling(s) Name of Support/Comfort Primary Source: She is currently staying with her sister  Medications Reviewed Today: Medications Reviewed Today   Medications were not reviewed in this encounter     Home Care and Equipment/Supplies: Were Home Health Services Ordered?: No Any new equipment or medical supplies ordered?: No  Functional Questionnaire: Do you need assistance with bathing/showering or dressing?: No Do you need assistance with meal preparation?: No Do you need assistance with eating?: No Do you have difficulty maintaining continence: No Do you need assistance with getting out of bed/getting out of a chair/moving?: No Do you have difficulty managing or taking your medications?:  No  Follow up appointments reviewed: PCP Follow-up appointment confirmed?: No (She has the phone number for [facility] and said she will call when she is ready for an appointment,) MD Provider Line Number:[phone] Given: No Specialist Hospital Follow-up appointment confirmed?: NA Do you need transportation to your follow-up appointment?: No Do you understand care options if your condition(s) worsen?: Yes-patient verbalized understanding    SIGNATURE Robyne Peers, RN

## 2023-06-13 ENCOUNTER — Other Ambulatory Visit: Payer: Self-pay

## 2023-06-19 ENCOUNTER — Other Ambulatory Visit: Payer: Self-pay

## 2023-06-24 ENCOUNTER — Other Ambulatory Visit: Payer: Self-pay

## 2023-06-27 ENCOUNTER — Other Ambulatory Visit (HOSPITAL_COMMUNITY): Payer: Self-pay

## 2023-07-09 ENCOUNTER — Other Ambulatory Visit: Payer: Self-pay

## 2023-07-21 ENCOUNTER — Emergency Department (HOSPITAL_COMMUNITY): Payer: Medicare PPO

## 2023-07-21 ENCOUNTER — Encounter (HOSPITAL_COMMUNITY): Payer: Self-pay

## 2023-07-21 ENCOUNTER — Other Ambulatory Visit: Payer: Self-pay

## 2023-07-21 ENCOUNTER — Inpatient Hospital Stay (HOSPITAL_COMMUNITY)
Admission: EM | Admit: 2023-07-21 | Discharge: 2023-07-30 | DRG: 641 | Disposition: A | Discharge status: Other Acute Inpatient Hospital | Payer: Medicare PPO | Attending: Family Medicine | Admitting: Family Medicine

## 2023-07-21 DIAGNOSIS — F1320 Sedative, hypnotic or anxiolytic dependence, uncomplicated: Secondary | ICD-10-CM | POA: Diagnosis not present

## 2023-07-21 DIAGNOSIS — F17210 Nicotine dependence, cigarettes, uncomplicated: Secondary | ICD-10-CM | POA: Diagnosis present

## 2023-07-21 DIAGNOSIS — C7801 Secondary malignant neoplasm of right lung: Secondary | ICD-10-CM | POA: Diagnosis not present

## 2023-07-21 DIAGNOSIS — R531 Weakness: Secondary | ICD-10-CM | POA: Diagnosis not present

## 2023-07-21 DIAGNOSIS — R630 Anorexia: Secondary | ICD-10-CM | POA: Diagnosis present

## 2023-07-21 DIAGNOSIS — F4310 Post-traumatic stress disorder, unspecified: Secondary | ICD-10-CM | POA: Diagnosis present

## 2023-07-21 DIAGNOSIS — K219 Gastro-esophageal reflux disease without esophagitis: Secondary | ICD-10-CM | POA: Diagnosis present

## 2023-07-21 DIAGNOSIS — R000 Tachycardia, unspecified: Secondary | ICD-10-CM | POA: Diagnosis not present

## 2023-07-21 DIAGNOSIS — J984 Other disorders of lung: Secondary | ICD-10-CM | POA: Diagnosis not present

## 2023-07-21 DIAGNOSIS — C531 Malignant neoplasm of exocervix: Secondary | ICD-10-CM | POA: Diagnosis not present

## 2023-07-21 DIAGNOSIS — Z6827 Body mass index (BMI) 27.0-27.9, adult: Secondary | ICD-10-CM

## 2023-07-21 DIAGNOSIS — R599 Enlarged lymph nodes, unspecified: Secondary | ICD-10-CM | POA: Diagnosis not present

## 2023-07-21 DIAGNOSIS — Z885 Allergy status to narcotic agent status: Secondary | ICD-10-CM

## 2023-07-21 DIAGNOSIS — N179 Acute kidney failure, unspecified: Secondary | ICD-10-CM | POA: Diagnosis not present

## 2023-07-21 DIAGNOSIS — J449 Chronic obstructive pulmonary disease, unspecified: Secondary | ICD-10-CM | POA: Diagnosis not present

## 2023-07-21 DIAGNOSIS — R0602 Shortness of breath: Secondary | ICD-10-CM | POA: Diagnosis not present

## 2023-07-21 DIAGNOSIS — Z79899 Other long term (current) drug therapy: Secondary | ICD-10-CM

## 2023-07-21 DIAGNOSIS — C787 Secondary malignant neoplasm of liver and intrahepatic bile duct: Secondary | ICD-10-CM | POA: Diagnosis present

## 2023-07-21 DIAGNOSIS — C786 Secondary malignant neoplasm of retroperitoneum and peritoneum: Secondary | ICD-10-CM | POA: Diagnosis not present

## 2023-07-21 DIAGNOSIS — R188 Other ascites: Secondary | ICD-10-CM | POA: Diagnosis present

## 2023-07-21 DIAGNOSIS — Z8249 Family history of ischemic heart disease and other diseases of the circulatory system: Secondary | ICD-10-CM

## 2023-07-21 DIAGNOSIS — I491 Atrial premature depolarization: Secondary | ICD-10-CM | POA: Diagnosis not present

## 2023-07-21 DIAGNOSIS — E785 Hyperlipidemia, unspecified: Secondary | ICD-10-CM | POA: Diagnosis present

## 2023-07-21 DIAGNOSIS — D72829 Elevated white blood cell count, unspecified: Secondary | ICD-10-CM | POA: Diagnosis present

## 2023-07-21 DIAGNOSIS — I499 Cardiac arrhythmia, unspecified: Secondary | ICD-10-CM | POA: Diagnosis not present

## 2023-07-21 DIAGNOSIS — J90 Pleural effusion, not elsewhere classified: Secondary | ICD-10-CM | POA: Diagnosis present

## 2023-07-21 DIAGNOSIS — I10 Essential (primary) hypertension: Secondary | ICD-10-CM | POA: Diagnosis present

## 2023-07-21 DIAGNOSIS — F1090 Alcohol use, unspecified, uncomplicated: Secondary | ICD-10-CM | POA: Diagnosis present

## 2023-07-21 DIAGNOSIS — E876 Hypokalemia: Principal | ICD-10-CM | POA: Diagnosis present

## 2023-07-21 DIAGNOSIS — N390 Urinary tract infection, site not specified: Secondary | ICD-10-CM | POA: Diagnosis present

## 2023-07-21 DIAGNOSIS — M1990 Unspecified osteoarthritis, unspecified site: Secondary | ICD-10-CM | POA: Diagnosis present

## 2023-07-21 DIAGNOSIS — F1010 Alcohol abuse, uncomplicated: Secondary | ICD-10-CM | POA: Diagnosis present

## 2023-07-21 DIAGNOSIS — C539 Malignant neoplasm of cervix uteri, unspecified: Secondary | ICD-10-CM | POA: Diagnosis not present

## 2023-07-21 DIAGNOSIS — Z91148 Patient's other noncompliance with medication regimen for other reason: Secondary | ICD-10-CM

## 2023-07-21 DIAGNOSIS — C7800 Secondary malignant neoplasm of unspecified lung: Secondary | ICD-10-CM | POA: Diagnosis not present

## 2023-07-21 DIAGNOSIS — D638 Anemia in other chronic diseases classified elsewhere: Secondary | ICD-10-CM | POA: Diagnosis present

## 2023-07-21 DIAGNOSIS — R140 Abdominal distension (gaseous): Secondary | ICD-10-CM | POA: Diagnosis present

## 2023-07-21 DIAGNOSIS — R911 Solitary pulmonary nodule: Secondary | ICD-10-CM | POA: Diagnosis not present

## 2023-07-21 DIAGNOSIS — F330 Major depressive disorder, recurrent, mild: Secondary | ICD-10-CM | POA: Diagnosis not present

## 2023-07-21 DIAGNOSIS — Z803 Family history of malignant neoplasm of breast: Secondary | ICD-10-CM

## 2023-07-21 DIAGNOSIS — Z7951 Long term (current) use of inhaled steroids: Secondary | ICD-10-CM

## 2023-07-21 DIAGNOSIS — F419 Anxiety disorder, unspecified: Secondary | ICD-10-CM | POA: Diagnosis present

## 2023-07-21 DIAGNOSIS — N1330 Unspecified hydronephrosis: Secondary | ICD-10-CM | POA: Diagnosis not present

## 2023-07-21 DIAGNOSIS — C7802 Secondary malignant neoplasm of left lung: Secondary | ICD-10-CM | POA: Diagnosis not present

## 2023-07-21 DIAGNOSIS — S0990XA Unspecified injury of head, initial encounter: Secondary | ICD-10-CM | POA: Diagnosis not present

## 2023-07-21 DIAGNOSIS — S199XXA Unspecified injury of neck, initial encounter: Secondary | ICD-10-CM | POA: Diagnosis not present

## 2023-07-21 DIAGNOSIS — R410 Disorientation, unspecified: Secondary | ICD-10-CM | POA: Diagnosis not present

## 2023-07-21 DIAGNOSIS — D75839 Thrombocytosis, unspecified: Secondary | ICD-10-CM | POA: Diagnosis present

## 2023-07-21 LAB — BLOOD GAS, VENOUS
Acid-Base Excess: 4 mmol/L — ABNORMAL HIGH (ref 0.0–2.0)
Bicarbonate: 29.7 mmol/L — ABNORMAL HIGH (ref 20.0–28.0)
Drawn by: 1828
O2 Saturation: 42.1 %
Patient temperature: 36.5
pCO2, Ven: 48 mm[Hg] (ref 44–60)
pH, Ven: 7.4 (ref 7.25–7.43)
pO2, Ven: <31 mm[Hg] — CL (ref 32–45)

## 2023-07-21 LAB — MAGNESIUM: Magnesium: 2.3 mg/dL (ref 1.7–2.4)

## 2023-07-21 LAB — I-STAT CHEM 8, ED
BUN: 8 mg/dL (ref 8–23)
Calcium, Ion: 1.47 mmol/L — ABNORMAL HIGH (ref 1.15–1.40)
Chloride: 98 mmol/L (ref 98–111)
Creatinine, Ser: 1.1 mg/dL — ABNORMAL HIGH (ref 0.44–1.00)
Glucose, Bld: 114 mg/dL — ABNORMAL HIGH (ref 70–99)
HCT: 28 % — ABNORMAL LOW (ref 36.0–46.0)
Hemoglobin: 9.5 g/dL — ABNORMAL LOW (ref 12.0–15.0)
Potassium: 2.2 mmol/L — CL (ref 3.5–5.1)
Sodium: 134 mmol/L — ABNORMAL LOW (ref 135–145)
TCO2: 25 mmol/L (ref 22–32)

## 2023-07-21 LAB — CBC WITH DIFFERENTIAL/PLATELET
Abs Immature Granulocytes: 0.13 10*3/uL — ABNORMAL HIGH (ref 0.00–0.07)
Basophils Absolute: 0 10*3/uL (ref 0.0–0.1)
Basophils Relative: 0 %
Eosinophils Absolute: 0 10*3/uL (ref 0.0–0.5)
Eosinophils Relative: 0 %
HCT: 28.1 % — ABNORMAL LOW (ref 36.0–46.0)
Hemoglobin: 8.6 g/dL — ABNORMAL LOW (ref 12.0–15.0)
Immature Granulocytes: 1 %
Lymphocytes Relative: 11 %
Lymphs Abs: 1.3 10*3/uL (ref 0.7–4.0)
MCH: 23.6 pg — ABNORMAL LOW (ref 26.0–34.0)
MCHC: 30.6 g/dL (ref 30.0–36.0)
MCV: 77.2 fL — ABNORMAL LOW (ref 80.0–100.0)
Monocytes Absolute: 0.7 10*3/uL (ref 0.1–1.0)
Monocytes Relative: 6 %
Neutro Abs: 10.1 10*3/uL — ABNORMAL HIGH (ref 1.7–7.7)
Neutrophils Relative %: 82 %
Platelets: 735 10*3/uL — ABNORMAL HIGH (ref 150–400)
RBC: 3.64 MIL/uL — ABNORMAL LOW (ref 3.87–5.11)
RDW: 17.4 % — ABNORMAL HIGH (ref 11.5–15.5)
WBC: 12.2 10*3/uL — ABNORMAL HIGH (ref 4.0–10.5)
nRBC: 0 % (ref 0.0–0.2)

## 2023-07-21 LAB — AMMONIA: Ammonia: <10 umol/L (ref 9–35)

## 2023-07-21 LAB — HEPATIC FUNCTION PANEL
ALT: 11 U/L (ref 0–44)
AST: 24 U/L (ref 15–41)
Albumin: 2.6 g/dL — ABNORMAL LOW (ref 3.5–5.0)
Alkaline Phosphatase: 93 U/L (ref 38–126)
Bilirubin, Direct: 0.1 mg/dL (ref 0.0–0.2)
Indirect Bilirubin: 0.6 mg/dL (ref 0.3–0.9)
Total Bilirubin: 0.7 mg/dL (ref 0.3–1.2)
Total Protein: 6.8 g/dL (ref 6.5–8.1)

## 2023-07-21 LAB — BASIC METABOLIC PANEL
Anion gap: 13 (ref 5–15)
BUN: 10 mg/dL (ref 8–23)
CO2: 25 mmol/L (ref 22–32)
Calcium: 10.6 mg/dL — ABNORMAL HIGH (ref 8.9–10.3)
Chloride: 94 mmol/L — ABNORMAL LOW (ref 98–111)
Creatinine, Ser: 1.04 mg/dL — ABNORMAL HIGH (ref 0.44–1.00)
GFR, Estimated: 60 mL/min — ABNORMAL LOW (ref >60)
Glucose, Bld: 114 mg/dL — ABNORMAL HIGH (ref 70–99)
Potassium: 2 mmol/L — CL (ref 3.5–5.1)
Sodium: 132 mmol/L — ABNORMAL LOW (ref 135–145)

## 2023-07-21 LAB — ETHANOL: Alcohol, Ethyl (B): <10 mg/dL (ref <10)

## 2023-07-21 LAB — TROPONIN I (HIGH SENSITIVITY)
Troponin I (High Sensitivity): 8 ng/L (ref <18)
Troponin I (High Sensitivity): 8 ng/L (ref <18)

## 2023-07-21 LAB — CBG MONITORING, ED: Glucose-Capillary: 107 mg/dL — ABNORMAL HIGH (ref 70–99)

## 2023-07-21 MED ORDER — LACTATED RINGERS IV BOLUS
1000.0000 mL | Freq: Once | INTRAVENOUS | Status: AC
  Administered 2023-07-21: 1000 mL via INTRAVENOUS

## 2023-07-21 MED ORDER — POTASSIUM CHLORIDE 10 MEQ/100ML IV SOLN
10.0000 meq | INTRAVENOUS | Status: AC
  Administered 2023-07-21 – 2023-07-22 (×6): 10 meq via INTRAVENOUS
  Filled 2023-07-21 (×6): qty 100

## 2023-07-21 MED ORDER — SODIUM CHLORIDE 0.9 % IV SOLN: INTRAVENOUS | Status: DC

## 2023-07-21 MED ORDER — SODIUM CHLORIDE 0.9 % IV BOLUS: 1000.0000 mL | Freq: Once | INTRAVENOUS | Status: DC

## 2023-07-21 NOTE — ED Notes (Signed)
Pt  was taken in a wheelchair to the bathroom. Pt sat and tried to urinate but could not. Pt is now back in bed and family is at bedside.

## 2023-07-21 NOTE — ED Triage Notes (Signed)
Pt BIB EMS for weakness. Per EMS pt drinks every night and has been drinking tonight. Pt states weakness and AMS. Per EMS pt also smells of UTI.

## 2023-07-21 NOTE — ED Provider Notes (Signed)
[place] EMERGENCY DEPARTMENT AT [facility] Provider Note   CSN: [id number] Arrival date & time: 07/21/23  1832     History  Chief Complaint  Patient presents with   Weakness    Kimberly Adkins is a 67 y.o. female.  HPI Patient presents for generalized weakness.  Medical history includes depression, benzodiazepine use, alcohol use, COPD, GERD, anxiety, HLD, arthritis.  She states that she has been staying at her sister's house lately.  She states that she typically has no trouble with ambulation.  She has had generalized weakness over the past several days.  This has led to 8 falls.  She denies any areas of new pain or suspected injury.  She does not recall the last time she had a bowel movement.  She did have some vomiting last night.  EMS reports normal CBG and normal vital signs.  They did note some mild confusion.  Patient did endorse some alcohol use with EMS but denies any currently.    Home Medications Prior to Admission medications   Medication Sig Start Date End Date Taking? Authorizing Provider  ALPRAZolam Prudy Feeler) 1 MG tablet Take 1 tablet (1 mg total) by mouth 3 (three) times daily. 06/10/23   Nwoko, Tommas Olp, PA  amLODipine (NORVASC) 5 MG tablet Take 1 tablet (5 mg total) by mouth daily. 06/06/23   [name], MD  atorvastatin (LIPITOR) 40 MG tablet Take 1 tablet (40 mg total) by mouth daily. Patient not taking: Reported on 06/03/2023 05/13/23   [name], NP  FLUoxetine (PROZAC) 20 MG capsule Take 3 capsules (60 mg total) by mouth daily. 06/10/23   Nwoko, Tommas Olp, PA  fluticasone-salmeterol (ADVAIR) 100-50 MCG/ACT AEPB Inhale 1 puff into the lungs 2 (two) times daily. 05/13/23   [name], NP  nicotine (NICODERM CQ - DOSED IN MG/24 HOURS) 21 mg/24hr patch Place 1 patch (21 mg total) onto the skin daily. 06/07/23   [name], MD  potassium chloride SA (KLOR-CON M) 20 MEQ tablet Take 1 tablet (20 mEq total) by mouth daily. 06/06/23   [name], MD      Allergies    Hydromorphone    Review of Systems   Review of Systems  Unable to perform ROS: Mental status change  Neurological:  Positive for weakness (Generalized).  Psychiatric/Behavioral:  Positive for confusion.   All other systems reviewed and are negative.   Physical Exam Updated Vital Signs BP (!) 141/66   Pulse 85   Temp 97.7 F (36.5 C) (Oral)   Resp 20   Ht 5\' 4"  (1.626 m)   Wt 80.7 kg   SpO2 95%   BMI 30.54 kg/m  Physical Exam Vitals and nursing note reviewed.  Constitutional:      General: She is not in acute distress.    Appearance: Normal appearance. She is well-developed. She is not toxic-appearing or diaphoretic.  HENT:     Head: Normocephalic and atraumatic.     Right Ear: External ear normal.     Left Ear: External ear normal.     Nose: Nose normal.     Mouth/Throat:     Mouth: Mucous membranes are dry.  Eyes:     Extraocular Movements: Extraocular movements intact.     Conjunctiva/sclera: Conjunctivae normal.  Cardiovascular:     Rate and Rhythm: Normal rate and regular rhythm.     Heart sounds: No murmur heard. Pulmonary:     Effort: Pulmonary effort is normal. No respiratory  distress.     Breath sounds: Decreased breath sounds present.  Abdominal:     General: There is distension.     Palpations: Abdomen is soft.     Tenderness: There is no abdominal tenderness. There is no guarding or rebound.  Musculoskeletal:        General: No swelling. Normal range of motion.     Cervical back: Normal range of motion and neck supple.     Right lower leg: No edema.     Left lower leg: No edema.  Skin:    General: Skin is warm and dry.     Coloration: Skin is pale. Skin is not jaundiced.  Neurological:     General: No focal deficit present.     Mental Status: She is alert and oriented to person, place, and time.  Psychiatric:        Mood and Affect: Mood and affect normal.        Speech: Speech is slurred.        Behavior:  Behavior normal. Behavior is cooperative.     ED Results / Procedures / Treatments   Labs (all labs ordered are listed, but only abnormal results are displayed) Labs Reviewed  CBC WITH DIFFERENTIAL/PLATELET - Abnormal; Notable for the following components:      Result Value   WBC 12.2 (*)    RBC 3.64 (*)    Hemoglobin 8.6 (*)    HCT 28.1 (*)    MCV 77.2 (*)    MCH 23.6 (*)    RDW 17.4 (*)    Platelets 735 (*)    Neutro Abs 10.1 (*)    Abs Immature Granulocytes 0.13 (*)    All other components within normal limits  BASIC METABOLIC PANEL - Abnormal; Notable for the following components:   Sodium 132 (*)    Potassium 2.0 (*)    Chloride 94 (*)    Glucose, Bld 114 (*)    Creatinine, Ser 1.04 (*)    Calcium 10.6 (*)    GFR, Estimated 60 (*)    All other components within normal limits  HEPATIC FUNCTION PANEL - Abnormal; Notable for the following components:   Albumin 2.6 (*)    All other components within normal limits  BLOOD GAS, VENOUS - Abnormal; Notable for the following components:   pO2, Ven <31 (*)    Bicarbonate 29.7 (*)    Acid-Base Excess 4.0 (*)    All other components within normal limits  CBG MONITORING, ED - Abnormal; Notable for the following components:   Glucose-Capillary 107 (*)    All other components within normal limits  I-STAT CHEM 8, ED - Abnormal; Notable for the following components:   Sodium 134 (*)    Potassium 2.2 (*)    Creatinine, Ser 1.10 (*)    Glucose, Bld 114 (*)    Calcium, Ion 1.47 (*)    Hemoglobin 9.5 (*)    HCT 28.0 (*)    All other components within normal limits  AMMONIA  MAGNESIUM  ETHANOL  URINALYSIS, ROUTINE W REFLEX MICROSCOPIC  RAPID URINE DRUG SCREEN, HOSP PERFORMED  TROPONIN I (HIGH SENSITIVITY)  TROPONIN I (HIGH SENSITIVITY)    EKG EKG Interpretation Date/Time:  Monday July 21 2023 18:52:58 EDT Ventricular Rate:  90 PR Interval:  124 QRS Duration:  93 QT Interval:  395 QTC Calculation: 484 R  Axis:   44  Text Interpretation: Sinus rhythm Low voltage, precordial leads Anteroseptal infarct, age indeterminate Confirmed by Durwin Nora,  Rossanna Spitzley [phone]) on 07/21/2023 7:02:45 PM  Radiology CT CHEST ABDOMEN PELVIS WO CONTRAST  Result Date: 07/21/2023 CLINICAL DATA:  Unintentional weight loss. Weakness and altered mental status. EXAM: CT CHEST, ABDOMEN AND PELVIS WITHOUT CONTRAST TECHNIQUE: Multidetector CT imaging of the chest, abdomen and pelvis was performed following the standard protocol without IV contrast. RADIATION DOSE REDUCTION: This exam was performed according to the departmental dose-optimization program which includes automated exposure control, adjustment of the mA and/or kV according to patient size and/or use of iterative reconstruction technique. COMPARISON:  Chest radiograph dated 07/21/2023. FINDINGS: Evaluation of this exam is limited in the absence of intravenous contrast. CT CHEST FINDINGS Cardiovascular: There is no cardiomegaly or pericardial effusion. Mild atherosclerotic calcification of the thoracic aorta. The central pulmonary arteries are grossly unremarkable on this noncontrast CT. Mediastinum/Nodes: No hilar or mediastinal adenopathy. The esophagus is grossly unremarkable. No mediastinal fluid collection. Lungs/Pleura: Small left pleural effusion with minimal compressive atelectasis of the left lower lobe. Several scattered pulmonary nodules measure up to 9 mm consistent with metastatic disease. There is no pneumothorax. The central airways are patent. Musculoskeletal: No acute osseous pathology. CT ABDOMEN PELVIS FINDINGS No intra-abdominal free air.  Small ascites. Hepatobiliary: Ill-defined hepatic hypodense lesions consistent with metastatic disease. No ductal dilatation. No calcified gallstone. Pancreas: The pancreas is unremarkable. Spleen: Normal in size without focal abnormality. Adrenals/Urinary Tract: The adrenal glands are unremarkable. There is mild left hydronephrosis.  Gradual transition at the ureteropelvic junction. No stone. There is no hydronephrosis or nephrolithiasis on the right. The urinary bladder is minimally distended. Stomach/Bowel: Evaluation of the bowel is limited in the absence of oral contrast. There is no bowel obstruction. Vascular/Lymphatic: Moderate aortoiliac atherosclerotic disease. The IVC is unremarkable. No portal venous gas. Mildly enlarged aortocaval lymph node measures 14 mm. Reproductive: The uterus is poorly visualized. There appears to be an ill-defined hypodense mass in the lower uterus/cervical region measuring approximately 6.7 x 6.7 cm concerning for malignancy. Other: There is extensive omental and mesenteric nodularity consistent with implants. Musculoskeletal: Degenerative changes of the lower lumbar spine. No acute osseous pathology. IMPRESSION: 1. 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 Multidisciplinary consult is advised. 2. Small malignant left pleural effusion and small ascites. 3. Mild left hydronephrosis. 4.  Aortic Atherosclerosis (ICD10-I70.0). Electronically Signed   By: Elgie Collard M.D.   On: 07/21/2023 22:53   CT HEAD WO CONTRAST  Result Date: 07/21/2023 CLINICAL DATA:  Head trauma, minor (Age >= 65y); Neck trauma (Age >= 65y) Per EMS pt drinks every night and has been drinking tonight. Pt states weakness and AMS. Per EMS pt also smells of UTI. EXAM: CT HEAD WITHOUT CONTRAST CT CERVICAL SPINE WITHOUT CONTRAST TECHNIQUE: Multidetector CT imaging of the head and cervical spine was performed following the standard protocol without intravenous contrast. Multiplanar CT image reconstructions of the cervical spine were also generated. RADIATION DOSE REDUCTION: This exam was performed according to the departmental dose-optimization program which includes automated exposure control, adjustment of the mA and/or kV according to patient size and/or use  of iterative reconstruction technique. COMPARISON:  None Available. FINDINGS: CT HEAD FINDINGS Brain: Cerebral ventricle sizes are concordant with the degree of cerebral volume loss. Patchy and confluent areas of decreased attenuation are noted throughout the deep and periventricular white matter of the cerebral hemispheres bilaterally, compatible with chronic microvascular ischemic disease. Left occipital lobe encephalomalacia. No evidence of large-territorial acute infarction. No parenchymal hemorrhage. No mass lesion. No extra-axial collection. No  mass effect or midline shift. No hydrocephalus. Basilar cisterns are patent. Vascular: No hyperdense vessel. Skull: No acute fracture or focal lesion. Sinuses/Orbits: Paranasal sinuses and mastoid air cells are clear. The orbits are unremarkable. Other: None. CT CERVICAL SPINE FINDINGS Alignment: Reversal of normal cervical lordosis likely due to positioning and degenerative changes. Skull base and vertebrae: Mild to moderate degenerative changes spine most prominent at the C5 through C7 levels. No associated severe osseous neural foraminal or central canal stenosis. No acute fracture. No aggressive appearing focal osseous lesion or focal pathologic process. Soft tissues and spinal canal: No prevertebral fluid or swelling. No visible canal hematoma. Upper chest: Subpleural 0.7 cm left upper lobe pulmonary nodule. Please see separately dictated CT chest 07/21/2023. Other: Atherosclerotic plaque of the carotid arteries within the neck. IMPRESSION: 1. No acute intracranial abnormality. 2. No acute displaced fracture or traumatic listhesis of the cervical spine. 3. Subpleural 0.7 cm left upper lobe pulmonary nodule. Please see separately dictated CT chest 07/21/2023. Electronically Signed   By: Tish Frederickson M.D.   On: 07/21/2023 22:46   CT CERVICAL SPINE WO CONTRAST  Result Date: 07/21/2023 CLINICAL DATA:  Head trauma, minor (Age >= 65y); Neck trauma (Age >= 65y) Per  EMS pt drinks every night and has been drinking tonight. Pt states weakness and AMS. Per EMS pt also smells of UTI. EXAM: CT HEAD WITHOUT CONTRAST CT CERVICAL SPINE WITHOUT CONTRAST TECHNIQUE: Multidetector CT imaging of the head and cervical spine was performed following the standard protocol without intravenous contrast. Multiplanar CT image reconstructions of the cervical spine were also generated. RADIATION DOSE REDUCTION: This exam was performed according to the departmental dose-optimization program which includes automated exposure control, adjustment of the mA and/or kV according to patient size and/or use of iterative reconstruction technique. COMPARISON:  None Available. FINDINGS: CT HEAD FINDINGS Brain: Cerebral ventricle sizes are concordant with the degree of cerebral volume loss. Patchy and confluent areas of decreased attenuation are noted throughout the deep and periventricular white matter of the cerebral hemispheres bilaterally, compatible with chronic microvascular ischemic disease. Left occipital lobe encephalomalacia. No evidence of large-territorial acute infarction. No parenchymal hemorrhage. No mass lesion. No extra-axial collection. No mass effect or midline shift. No hydrocephalus. Basilar cisterns are patent. Vascular: No hyperdense vessel. Skull: No acute fracture or focal lesion. Sinuses/Orbits: Paranasal sinuses and mastoid air cells are clear. The orbits are unremarkable. Other: None. CT CERVICAL SPINE FINDINGS Alignment: Reversal of normal cervical lordosis likely due to positioning and degenerative changes. Skull base and vertebrae: Mild to moderate degenerative changes spine most prominent at the C5 through C7 levels. No associated severe osseous neural foraminal or central canal stenosis. No acute fracture. No aggressive appearing focal osseous lesion or focal pathologic process. Soft tissues and spinal canal: No prevertebral fluid or swelling. No visible canal hematoma. Upper  chest: Subpleural 0.7 cm left upper lobe pulmonary nodule. Please see separately dictated CT chest 07/21/2023. Other: Atherosclerotic plaque of the carotid arteries within the neck. IMPRESSION: 1. No acute intracranial abnormality. 2. No acute displaced fracture or traumatic listhesis of the cervical spine. 3. Subpleural 0.7 cm left upper lobe pulmonary nodule. Please see separately dictated CT chest 07/21/2023. Electronically Signed   By: Tish Frederickson M.D.   On: 07/21/2023 22:46   DG Chest Port 1 View  Result Date: 07/21/2023 CLINICAL DATA:  Fall, weakness EXAM: PORTABLE CHEST 1 VIEW COMPARISON:  06/03/2023 FINDINGS: The heart size and mediastinal contours are within normal limits. Both lungs are clear. Unchanged  eventration of the right hemidiaphragm. The visualized skeletal structures are unremarkable. IMPRESSION: No acute abnormality of the lungs in [facility] portable projection. Electronically Signed   By: Jearld Lesch M.D.   On: 07/21/2023 20:35    Procedures Procedures    Medications Ordered in ED Medications  potassium chloride 10 mEq in 100 mL IVPB (10 mEq Intravenous New Bag/Given 07/21/23 2136)  0.9 %  sodium chloride infusion ( Intravenous New Bag/Given 07/21/23 2036)  lactated ringers bolus 1,000 mL (0 mLs Intravenous Stopped 07/21/23 1955)    ED Course/ Medical Decision Making/ A&P                                 Medical Decision Making Amount and/or Complexity of Data Reviewed Labs: ordered. Radiology: ordered.  Risk Prescription drug management.   This patient presents to the ED for concern of generalized weakness, this involves an extensive number of treatment options, and is a complaint that carries with it a high risk of complications and morbidity.  The differential diagnosis includes infection, dehydration, metabolic derangements, anemia, deconditioning, intoxication   Co morbidities that complicate the patient evaluation  depression, benzodiazepine use, alcohol use,  COPD, GERD, anxiety, HLD, arthritis   Additional history obtained:  Additional history obtained from EMS External records from outside source obtained and reviewed including EMR   Lab Tests:  I Ordered, and personally interpreted labs.  The pertinent results include: Severe hypokalemia and AKI are present.  Anemia is baseline.  A mild leukocytosis is present consistent with prior lab work.  Thrombocytosis is present, also consistent with prior lab work   Imaging Studies ordered:  I ordered imaging studies including CT of head, cervical spine, chest, abdomen, pelvis I independently visualized and interpreted imaging which showed findings consistent with extensive metastatic disease involving lungs, liver, and peritoneum.  Hypodense mass in lower uterus concerning for cervical neoplasm.  Mild ascites and left pleural effusion. I agree with the radiologist interpretation   Cardiac Monitoring: / EKG:  The patient was maintained on a cardiac monitor.  I personally viewed and interpreted the cardiac monitored which showed an underlying rhythm of: Sinus rhythm  Problem List / ED Course / Critical interventions / Medication management  Patient presents via EMS from her sisters home for report of generalized weakness.  EMS reports that when they arrived on scene, she was seated on the edge of a bathtub.  She was unable to stand.  She reports that she typically ambulates without difficulty.  On arrival, she does have dry oromucosa.  She had some vomiting last night.  Her abdomen appears distended but she states that that is normal for her.  She does not have abdominal tenderness.  She does not recall the last time she had a bowel movement.  Her speech is mildly slurred.  She does have a history of alcohol and benzodiazepine use but denies any recently.  She has no focal neurologic deficits.  She is alert and oriented at this time.  Workup was initiated.  Lab work was notable for hypokalemia.   Replacement potassium was initiated.  CBC shows a leukocytosis, anemia, and thrombocytosis.  This is consistent with her prior lab work from a month ago.  Per chart review, she was admitted for hypokalemia in August.  On CT imaging she was found to have findings consistent with extensive metastatic disease.  This would explain her distended abdomen.  Given her severe hypokalemia,  patient was admitted to medicine for further management. I ordered medication including IV fluids for hydration; potassium chloride for hypokalemia Reevaluation of the patient after these medicines showed that the patient improved I have reviewed the patients home medicines and have made adjustments as needed   Social Determinants of Health:  Lives with sister  CRITICAL CARE Performed by: Gloris Manchester   Total critical care time: 32 minutes  Critical care time was exclusive of separately billable procedures and treating other patients.  Critical care was necessary to treat or prevent imminent or life-threatening deterioration.  Critical care was time spent personally by me on the following activities: development of treatment plan with patient and/or surrogate as well as nursing, discussions with consultants, evaluation of patient's response to treatment, examination of patient, obtaining history from patient or surrogate, ordering and performing treatments and interventions, ordering and review of laboratory studies, ordering and review of radiographic studies, pulse oximetry and re-evaluation of patient's condition.         Final Clinical Impression(s) / ED Diagnoses Final diagnoses:  AKI (acute kidney injury) (HCC)  Hypokalemia    Rx / DC Orders ED Discharge Orders     None         Gloris Manchester, MD 07/21/23 2309

## 2023-07-22 DIAGNOSIS — E876 Hypokalemia: Secondary | ICD-10-CM

## 2023-07-22 LAB — BASIC METABOLIC PANEL
Anion gap: 12 (ref 5–15)
BUN: 9 mg/dL (ref 8–23)
CO2: 24 mmol/L (ref 22–32)
Calcium: 10.2 mg/dL (ref 8.9–10.3)
Chloride: 97 mmol/L — ABNORMAL LOW (ref 98–111)
Creatinine, Ser: 1.02 mg/dL — ABNORMAL HIGH (ref 0.44–1.00)
GFR, Estimated: >60 mL/min (ref >60)
Glucose, Bld: 102 mg/dL — ABNORMAL HIGH (ref 70–99)
Potassium: 2.4 mmol/L — CL (ref 3.5–5.1)
Sodium: 133 mmol/L — ABNORMAL LOW (ref 135–145)

## 2023-07-22 LAB — URINALYSIS, ROUTINE W REFLEX MICROSCOPIC
Bilirubin Urine: NEGATIVE
Glucose, UA: NEGATIVE mg/dL
Ketones, ur: 20 mg/dL — AB
Leukocytes,Ua: NEGATIVE
Nitrite: NEGATIVE
Protein, ur: 100 mg/dL — AB
Specific Gravity, Urine: 1.013 (ref 1.005–1.030)
pH: 6 (ref 5.0–8.0)

## 2023-07-22 LAB — RAPID URINE DRUG SCREEN, HOSP PERFORMED
Amphetamines: NOT DETECTED
Barbiturates: NOT DETECTED
Benzodiazepines: POSITIVE — AB
Cocaine: NOT DETECTED
Opiates: NOT DETECTED
Tetrahydrocannabinol: NOT DETECTED

## 2023-07-22 LAB — POTASSIUM: Potassium: 2.8 mmol/L — ABNORMAL LOW (ref 3.5–5.1)

## 2023-07-22 MED ORDER — POTASSIUM CHLORIDE CRYS ER 20 MEQ PO TBCR
40.0000 meq | EXTENDED_RELEASE_TABLET | Freq: Once | ORAL | Status: AC
  Administered 2023-07-22: 40 meq via ORAL
  Filled 2023-07-22: qty 2

## 2023-07-22 MED ORDER — ACETAMINOPHEN 325 MG PO TABS: 650.0000 mg | ORAL_TABLET | Freq: Four times a day (QID) | ORAL | Status: DC | PRN

## 2023-07-22 MED ORDER — POTASSIUM CHLORIDE 10 MEQ/100ML IV SOLN
10.0000 meq | INTRAVENOUS | Status: AC
  Administered 2023-07-22 (×6): 10 meq via INTRAVENOUS
  Filled 2023-07-22 (×6): qty 100

## 2023-07-22 MED ORDER — CHLORHEXIDINE GLUCONATE CLOTH 2 % EX PADS
6.0000 | MEDICATED_PAD | Freq: Every day | CUTANEOUS | Status: DC
  Administered 2023-07-22 – 2023-07-26 (×5): 6 via TOPICAL

## 2023-07-22 MED ORDER — MOMETASONE FURO-FORMOTEROL FUM 100-5 MCG/ACT IN AERO
2.0000 | INHALATION_SPRAY | Freq: Two times a day (BID) | RESPIRATORY_TRACT | Status: DC
  Administered 2023-07-22 – 2023-07-29 (×14): 2 via RESPIRATORY_TRACT
  Filled 2023-07-22: qty 8.8

## 2023-07-22 MED ORDER — AMLODIPINE BESYLATE 5 MG PO TABS
5.0000 mg | ORAL_TABLET | Freq: Every day | ORAL | Status: DC
  Administered 2023-07-22 – 2023-07-25 (×4): 5 mg via ORAL
  Filled 2023-07-22 (×4): qty 1

## 2023-07-22 MED ORDER — POTASSIUM CHLORIDE 10 MEQ/100ML IV SOLN
10.0000 meq | INTRAVENOUS | Status: AC
  Administered 2023-07-22 (×3): 10 meq via INTRAVENOUS
  Filled 2023-07-22 (×3): qty 100

## 2023-07-22 MED ORDER — ACETAMINOPHEN 650 MG RE SUPP: 650.0000 mg | Freq: Four times a day (QID) | RECTAL | Status: DC | PRN

## 2023-07-22 MED ORDER — ALPRAZOLAM 1 MG PO TABS
1.0000 mg | ORAL_TABLET | Freq: Three times a day (TID) | ORAL | Status: DC
  Administered 2023-07-22 – 2023-07-26 (×12): 1 mg via ORAL
  Filled 2023-07-22 (×2): qty 2
  Filled 2023-07-22 (×2): qty 1
  Filled 2023-07-22: qty 2
  Filled 2023-07-22 (×2): qty 1
  Filled 2023-07-22 (×3): qty 2
  Filled 2023-07-22 (×2): qty 1

## 2023-07-22 MED ORDER — FLUOXETINE HCL 20 MG PO CAPS
60.0000 mg | ORAL_CAPSULE | Freq: Every day | ORAL | Status: DC
  Administered 2023-07-22 – 2023-07-29 (×8): 60 mg via ORAL
  Filled 2023-07-22 (×8): qty 3

## 2023-07-22 MED ORDER — ENOXAPARIN SODIUM 40 MG/0.4ML IJ SOSY
40.0000 mg | PREFILLED_SYRINGE | INTRAMUSCULAR | Status: DC
  Administered 2023-07-22: 40 mg via SUBCUTANEOUS
  Filled 2023-07-22: qty 0.4

## 2023-07-22 MED ORDER — NICOTINE 21 MG/24HR TD PT24
21.0000 mg | MEDICATED_PATCH | Freq: Every day | TRANSDERMAL | Status: DC
  Administered 2023-07-23 – 2023-07-29 (×7): 21 mg via TRANSDERMAL
  Filled 2023-07-22 (×7): qty 1

## 2023-07-22 NOTE — H&P (Signed)
History and Physical    Kimberly Adkins NWG:[id number] DOB: 1956-02-11 DOA: 07/21/2023  PCP: [name], NP   Patient coming from: Home  Chief Complaint: Weakness  HPI: Kimberly Adkins is a 67 y.o. female with medical history significant for depression/anxiety, prior alcohol use, tobacco abuse, COPD, GERD, dyslipidemia, hypertension, osteoarthritis, and "ovary problem" who states that she has had generalized weakness over the last several days which has led to over 8 falls.  She denies any new pain or injury.  She states she has not been taking her home potassium and does not state a reason why.  She has had an episode of nausea and vomiting as well as diarrhea.  She was also noted to have some mild confusion and states that she usually has some mild abdominal distention.   ED Course: Vital signs stable and patient afebrile.  Potassium noted to be 2.4 and she has been started on repletion.  She has also been started on some IV fluid in the ED.  Hemoglobin 9.5.  Review of Systems: Reviewed as noted above, otherwise negative.  Past Medical History:  Diagnosis Date   Anxiety    Depression    GERD (gastroesophageal reflux disease)    Hyperlipidemia    Hypertension    Osteoarthritis    PTSD (post-traumatic stress disorder) 2010    Past Surgical History:  Procedure Laterality Date   ANTERIOR AND POSTERIOR REPAIR  2007   BREAST BIOPSY  1997   right; benign   TUBAL LIGATION  1994     reports that she has been smoking cigarettes. She has a 33 pack-year smoking history. She has never used smokeless tobacco. She reports current alcohol use. She reports that she does not use drugs.  Allergies  Allergen Reactions   Hydromorphone Itching    Family History  Problem Relation Age of Onset   Breast cancer Paternal Grandmother        Age 26   Colon polyps Paternal Grandmother 19   Colon polyps Maternal Grandfather 1   Cancer Mother        Breast cancer   Breast cancer Mother         Age 33   Coronary artery disease Father    Hypertension Father    Breast cancer Paternal Aunt        Age 35's   Cancer Maternal Grandmother        colon   Heart attack Daughter     Prior to Admission medications   Medication Sig Start Date End Date Taking? Authorizing Provider  ALPRAZolam Prudy Feeler) 1 MG tablet Take 1 tablet (1 mg total) by mouth 3 (three) times daily. 06/10/23   Nwoko, Tommas Olp, PA  amLODipine (NORVASC) 5 MG tablet Take 1 tablet (5 mg total) by mouth daily. 06/06/23   [name], MD  atorvastatin (LIPITOR) 40 MG tablet Take 1 tablet (40 mg total) by mouth daily. Patient not taking: Reported on 06/03/2023 05/13/23   [name], NP  FLUoxetine (PROZAC) 20 MG capsule Take 3 capsules (60 mg total) by mouth daily. 06/10/23   Nwoko, Tommas Olp, PA  fluticasone-salmeterol (ADVAIR) 100-50 MCG/ACT AEPB Inhale 1 puff into the lungs 2 (two) times daily. 05/13/23   [name], NP  nicotine (NICODERM CQ - DOSED IN MG/24 HOURS) 21 mg/24hr patch Place 1 patch (21 mg total) onto the skin daily. 06/07/23   [name], MD  potassium chloride SA (KLOR-CON M) 20 MEQ tablet Take 1 tablet (  20 mEq total) by mouth daily. 06/06/23   [name], MD    Physical Exam: Vitals:   07/22/23 0854 07/22/23 0855 07/22/23 0856 07/22/23 0856  BP: (!) 150/74   (!) 150/74  Pulse: 93 94  94  Resp:  19  18  Temp:   97.8 F (36.6 C)   TempSrc:   Oral   SpO2: 95% 93%  96%  Weight:      Height:        Constitutional: NAD, calm, comfortable Vitals:   07/22/23 0854 07/22/23 0855 07/22/23 0856 07/22/23 0856  BP: (!) 150/74   (!) 150/74  Pulse: 93 94  94  Resp:  19  18  Temp:   97.8 F (36.6 C)   TempSrc:   Oral   SpO2: 95% 93%  96%  Weight:      Height:       Eyes: lids and conjunctivae normal Neck: normal, supple Respiratory: clear to auscultation bilaterally. Normal respiratory effort. No accessory muscle use.  Cardiovascular: Regular rate and rhythm, no murmurs. Abdomen:  no tenderness, moderately distended. Bowel sounds positive.  Musculoskeletal:  No edema. Skin: no rashes, lesions, ulcers.  Psychiatric: Flat affect  Labs on Admission: I have personally reviewed following labs and imaging studies  CBC: Recent Labs  Lab 07/21/23 1917 07/21/23 1924  WBC 12.2*  --   NEUTROABS 10.1*  --   HGB 8.6* 9.5*  HCT 28.1* 28.0*  MCV 77.2*  --   PLT 735*  --    Basic Metabolic Panel: Recent Labs  Lab 07/21/23 1917 07/21/23 1924 07/22/23 0546  NA 132* 134* 133*  K 2.0* 2.2* 2.4*  CL 94* 98 97*  CO2 25  --  24  GLUCOSE 114* 114* 102*  BUN 10 8 9   CREATININE 1.04* 1.10* 1.02*  CALCIUM 10.6*  --  10.2  MG 2.3  --   --    GFR: Estimated Creatinine Clearance: 56.5 mL/min (A) (by C-G formula based on SCr of 1.02 mg/dL (H)). Liver Function Tests: Recent Labs  Lab 07/21/23 1917  AST 24  ALT 11  ALKPHOS 93  BILITOT 0.7  PROT 6.8  ALBUMIN 2.6*   No results for input(s): "LIPASE", "AMYLASE" in the last 168 hours. Recent Labs  Lab 07/21/23 1917  AMMONIA <10   Coagulation Profile: No results for input(s): "INR", "PROTIME" in the last 168 hours. Cardiac Enzymes: No results for input(s): "CKTOTAL", "CKMB", "CKMBINDEX", "TROPONINI" in the last 168 hours. BNP (last 3 results) No results for input(s): "PROBNP" in the last 8760 hours. HbA1C: No results for input(s): "HGBA1C" in the last 72 hours. CBG: Recent Labs  Lab 07/21/23 1920  GLUCAP 107*   Lipid Profile: No results for input(s): "CHOL", "HDL", "LDLCALC", "TRIG", "CHOLHDL", "LDLDIRECT" in the last 72 hours. Thyroid Function Tests: No results for input(s): "TSH", "T4TOTAL", "FREET4", "T3FREE", "THYROIDAB" in the last 72 hours. Anemia Panel: No results for input(s): "VITAMINB12", "FOLATE", "FERRITIN", "TIBC", "IRON", "RETICCTPCT" in the last 72 hours. Urine analysis:    Component Value Date/Time   COLORURINE YELLOW 07/22/2023 0433   APPEARANCEUR CLEAR 07/22/2023 0433   LABSPEC 1.013  07/22/2023 0433   PHURINE 6.0 07/22/2023 0433   GLUCOSEU NEGATIVE 07/22/2023 0433   GLUCOSEU NEGATIVE 01/22/2012 1459   HGBUR SMALL (A) 07/22/2023 0433   BILIRUBINUR NEGATIVE 07/22/2023 0433   KETONESUR 20 (A) 07/22/2023 0433   PROTEINUR 100 (A) 07/22/2023 0433   UROBILINOGEN 0.2 01/22/2012 1459   NITRITE NEGATIVE 07/22/2023 0433  LEUKOCYTESUR NEGATIVE 07/22/2023 0433    Radiological Exams on Admission: CT CHEST ABDOMEN PELVIS WO CONTRAST  Result Date: 07/21/2023 CLINICAL DATA:  Unintentional weight loss. Weakness and altered mental status. EXAM: CT CHEST, ABDOMEN AND PELVIS WITHOUT CONTRAST TECHNIQUE: Multidetector CT imaging of the chest, abdomen and pelvis was performed following the standard protocol without IV contrast. RADIATION DOSE REDUCTION: This exam was performed according to the departmental dose-optimization program which includes automated exposure control, adjustment of the mA and/or kV according to patient size and/or use of iterative reconstruction technique. COMPARISON:  Chest radiograph dated 07/21/2023. FINDINGS: Evaluation of this exam is limited in the absence of intravenous contrast. CT CHEST FINDINGS Cardiovascular: There is no cardiomegaly or pericardial effusion. Mild atherosclerotic calcification of the thoracic aorta. The central pulmonary arteries are grossly unremarkable on this noncontrast CT. Mediastinum/Nodes: No hilar or mediastinal adenopathy. The esophagus is grossly unremarkable. No mediastinal fluid collection. Lungs/Pleura: Small left pleural effusion with minimal compressive atelectasis of the left lower lobe. Several scattered pulmonary nodules measure up to 9 mm consistent with metastatic disease. There is no pneumothorax. The central airways are patent. Musculoskeletal: No acute osseous pathology. CT ABDOMEN PELVIS FINDINGS No intra-abdominal free air.  Small ascites. Hepatobiliary: Ill-defined hepatic hypodense lesions consistent with metastatic disease.  No ductal dilatation. No calcified gallstone. Pancreas: The pancreas is unremarkable. Spleen: Normal in size without focal abnormality. Adrenals/Urinary Tract: The adrenal glands are unremarkable. There is mild left hydronephrosis. Gradual transition at the ureteropelvic junction. No stone. There is no hydronephrosis or nephrolithiasis on the right. The urinary bladder is minimally distended. Stomach/Bowel: Evaluation of the bowel is limited in the absence of oral contrast. There is no bowel obstruction. Vascular/Lymphatic: Moderate aortoiliac atherosclerotic disease. The IVC is unremarkable. No portal venous gas. Mildly enlarged aortocaval lymph node measures 14 mm. Reproductive: The uterus is poorly visualized. There appears to be an ill-defined hypodense mass in the lower uterus/cervical region measuring approximately 6.7 x 6.7 cm concerning for malignancy. Other: There is extensive omental and mesenteric nodularity consistent with implants. Musculoskeletal: Degenerative changes of the lower lumbar spine. No acute osseous pathology. IMPRESSION: 1. 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 Multidisciplinary consult is advised. 2. Small malignant left pleural effusion and small ascites. 3. Mild left hydronephrosis. 4.  Aortic Atherosclerosis (ICD10-I70.0). Electronically Signed   By: Elgie Collard M.D.   On: 07/21/2023 22:53   CT HEAD WO CONTRAST  Result Date: 07/21/2023 CLINICAL DATA:  Head trauma, minor (Age >= 65y); Neck trauma (Age >= 65y) Per EMS pt drinks every night and has been drinking tonight. Pt states weakness and AMS. Per EMS pt also smells of UTI. EXAM: CT HEAD WITHOUT CONTRAST CT CERVICAL SPINE WITHOUT CONTRAST TECHNIQUE: Multidetector CT imaging of the head and cervical spine was performed following the standard protocol without intravenous contrast. Multiplanar CT image reconstructions of the cervical spine were  also generated. RADIATION DOSE REDUCTION: This exam was performed according to the departmental dose-optimization program which includes automated exposure control, adjustment of the mA and/or kV according to patient size and/or use of iterative reconstruction technique. COMPARISON:  None Available. FINDINGS: CT HEAD FINDINGS Brain: Cerebral ventricle sizes are concordant with the degree of cerebral volume loss. Patchy and confluent areas of decreased attenuation are noted throughout the deep and periventricular white matter of the cerebral hemispheres bilaterally, compatible with chronic microvascular ischemic disease. Left occipital lobe encephalomalacia. No evidence of large-territorial acute infarction. No parenchymal hemorrhage. No mass lesion. No  extra-axial collection. No mass effect or midline shift. No hydrocephalus. Basilar cisterns are patent. Vascular: No hyperdense vessel. Skull: No acute fracture or focal lesion. Sinuses/Orbits: Paranasal sinuses and mastoid air cells are clear. The orbits are unremarkable. Other: None. CT CERVICAL SPINE FINDINGS Alignment: Reversal of normal cervical lordosis likely due to positioning and degenerative changes. Skull base and vertebrae: Mild to moderate degenerative changes spine most prominent at the C5 through C7 levels. No associated severe osseous neural foraminal or central canal stenosis. No acute fracture. No aggressive appearing focal osseous lesion or focal pathologic process. Soft tissues and spinal canal: No prevertebral fluid or swelling. No visible canal hematoma. Upper chest: Subpleural 0.7 cm left upper lobe pulmonary nodule. Please see separately dictated CT chest 07/21/2023. Other: Atherosclerotic plaque of the carotid arteries within the neck. IMPRESSION: 1. No acute intracranial abnormality. 2. No acute displaced fracture or traumatic listhesis of the cervical spine. 3. Subpleural 0.7 cm left upper lobe pulmonary nodule. Please see separately  dictated CT chest 07/21/2023. Electronically Signed   By: Tish Frederickson M.D.   On: 07/21/2023 22:46   CT CERVICAL SPINE WO CONTRAST  Result Date: 07/21/2023 CLINICAL DATA:  Head trauma, minor (Age >= 65y); Neck trauma (Age >= 65y) Per EMS pt drinks every night and has been drinking tonight. Pt states weakness and AMS. Per EMS pt also smells of UTI. EXAM: CT HEAD WITHOUT CONTRAST CT CERVICAL SPINE WITHOUT CONTRAST TECHNIQUE: Multidetector CT imaging of the head and cervical spine was performed following the standard protocol without intravenous contrast. Multiplanar CT image reconstructions of the cervical spine were also generated. RADIATION DOSE REDUCTION: This exam was performed according to the departmental dose-optimization program which includes automated exposure control, adjustment of the mA and/or kV according to patient size and/or use of iterative reconstruction technique. COMPARISON:  None Available. FINDINGS: CT HEAD FINDINGS Brain: Cerebral ventricle sizes are concordant with the degree of cerebral volume loss. Patchy and confluent areas of decreased attenuation are noted throughout the deep and periventricular white matter of the cerebral hemispheres bilaterally, compatible with chronic microvascular ischemic disease. Left occipital lobe encephalomalacia. No evidence of large-territorial acute infarction. No parenchymal hemorrhage. No mass lesion. No extra-axial collection. No mass effect or midline shift. No hydrocephalus. Basilar cisterns are patent. Vascular: No hyperdense vessel. Skull: No acute fracture or focal lesion. Sinuses/Orbits: Paranasal sinuses and mastoid air cells are clear. The orbits are unremarkable. Other: None. CT CERVICAL SPINE FINDINGS Alignment: Reversal of normal cervical lordosis likely due to positioning and degenerative changes. Skull base and vertebrae: Mild to moderate degenerative changes spine most prominent at the C5 through C7 levels. No associated severe osseous  neural foraminal or central canal stenosis. No acute fracture. No aggressive appearing focal osseous lesion or focal pathologic process. Soft tissues and spinal canal: No prevertebral fluid or swelling. No visible canal hematoma. Upper chest: Subpleural 0.7 cm left upper lobe pulmonary nodule. Please see separately dictated CT chest 07/21/2023. Other: Atherosclerotic plaque of the carotid arteries within the neck. IMPRESSION: 1. No acute intracranial abnormality. 2. No acute displaced fracture or traumatic listhesis of the cervical spine. 3. Subpleural 0.7 cm left upper lobe pulmonary nodule. Please see separately dictated CT chest 07/21/2023. Electronically Signed   By: Tish Frederickson M.D.   On: 07/21/2023 22:46   DG Chest Port 1 View  Result Date: 07/21/2023 CLINICAL DATA:  Fall, weakness EXAM: PORTABLE CHEST 1 VIEW COMPARISON:  06/03/2023 FINDINGS: The heart size and mediastinal contours are within normal limits. Both lungs  are clear. Unchanged eventration of the right hemidiaphragm. The visualized skeletal structures are unremarkable. IMPRESSION: No acute abnormality of the lungs in AP portable projection. Electronically Signed   By: Jearld Lesch M.D.   On: 07/21/2023 20:35    EKG: Independently reviewed. SR 90bpm.  Assessment/Plan Principal Problem:   Hypokalemia Active Problems:   GERD   Osteoarthritis   COPD (chronic obstructive pulmonary disease) (HCC)   Hypertension   Dyslipidemia   Benzodiazepine dependence (HCC)   Alcohol use disorder   MDD (major depressive disorder), recurrent episode, mild (HCC)    Generalized weakness secondary to severe hypokalemia -Apparently has been noncompliant with home potassium supplementation -Mild GI losses with nausea and vomiting as well as episode of diarrhea -Continue to replete -PT/OT evaluation  Abdominal distention secondary to concern for cervical neoplasm with metastatic disease to lungs, liver, and peritoneum -Appreciate gynecology  evaluation and recommendations  Anemia -No overt bleeding noted, but hemoglobin 9.5 -Continue to follow CBC and check anemia panel  Depression/anxiety -Continue Xanax and Prozac  COPD -DuoNebs as needed -Continue Dulera  Dyslipidemia -Continue atorvastatin  Hypertension -Continue amlodipine  Tobacco abuse -Smokes 1 pack/day, counseled on cessation   DVT prophylaxis: Lovenox Code Status: Full Family Communication: None at bedside Disposition Plan: Admit for treatment of hypokalemia and further evaluation of gynecologic cancer with metastatic disease Consults called: Gynecology Dr. Despina Hidden Admission status: Inpatient, SDU  Severity of Illness: The appropriate patient status for this patient is INPATIENT. Inpatient status is judged to be reasonable and necessary in order to provide the required intensity of service to ensure the patient's safety. The patient's presenting symptoms, physical exam findings, and initial radiographic and laboratory data in the context of their chronic comorbidities is felt to place them at high risk for further clinical deterioration. Furthermore, it is not anticipated that the patient will be medically stable for discharge from the hospital within 2 midnights of admission.   * I certify that at the point of admission it is my clinical judgment that the patient will require inpatient hospital care spanning beyond 2 midnights from the point of admission due to high intensity of service, high risk for further deterioration and high frequency of surveillance required.*   Shermon Bozzi D Anda Sobotta DO [facility]  If 7PM-7AM, please contact night-coverage [email or web address]  07/22/2023, 9:58 AM

## 2023-07-22 NOTE — ED Notes (Signed)
ED TO INPATIENT HANDOFF REPORT  ED Nurse Name and Phone #: Wandra Mannan, Paramedic  S Name/Age/Gender Kimberly Adkins 67 y.o. female Room/Bed: APA07/APA07  Code Status   Code Status: Full Code  Home/SNF/Other Home Patient oriented to: self Is this baseline? No   Triage Complete: Triage complete  Chief Complaint Hypokalemia [E87.6]  Triage Note Pt BIB EMS for weakness. Per EMS pt drinks every night and has been drinking tonight. Pt states weakness and AMS. Per EMS pt also smells of UTI.   Allergies Allergies  Allergen Reactions   Hydromorphone Itching    Level of Care/Admitting Diagnosis ED Disposition     ED Disposition  Admit   Condition  --   Comment  Hospital Area: [facility] [[id number]]  Level of Care: Stepdown [14]  Covid Evaluation: Asymptomatic - no recent exposure (last 10 days) testing not required  Diagnosis: Hypokalemia [172180]  Admitting Physician: Frankey Shown [[id number]]  Attending Physician: Frankey Shown [[id number]]  Certification:: I certify this patient will need inpatient services for at least 2 midnights  Expected Medical Readiness: 07/24/2023          B Medical/Surgery History Past Medical History:  Diagnosis Date   Anxiety    Depression    GERD (gastroesophageal reflux disease)    Hyperlipidemia    Hypertension    Osteoarthritis    PTSD (post-traumatic stress disorder) 2010   Past Surgical History:  Procedure Laterality Date   ANTERIOR AND POSTERIOR REPAIR  2007   BREAST BIOPSY  1997   right; benign   TUBAL LIGATION  1994     A IV Location/Drains/Wounds Patient Lines/Drains/Airways Status     Active Line/Drains/Airways     Name Placement date Placement time Site Days   Peripheral IV 07/21/23 20 G Right Antecubital 07/21/23  1915  Antecubital  1            Intake/Output Last 24 hours  Intake/Output Summary (Last 24 hours) at 07/22/2023 1550 Last data filed at 07/22/2023 0150 Gross per 24  hour  Intake 1100 ml  Output --  Net 1100 ml    Labs/Imaging Results for orders placed or performed during the hospital encounter of 07/21/23 (from the past 48 hour(s))  CBC WITH DIFFERENTIAL     Status: Abnormal   Collection Time: 07/21/23  7:17 PM  Result Value Ref Range   WBC 12.2 (H) 4.0 - 10.5 K/uL   RBC 3.64 (L) 3.87 - 5.11 MIL/uL   Hemoglobin 8.6 (L) 12.0 - 15.0 g/dL    Comment: Reticulocyte Hemoglobin testing may be clinically indicated, consider ordering this additional test [id number]    HCT 28.1 (L) 36.0 - 46.0 %   MCV 77.2 (L) 80.0 - 100.0 fL   MCH 23.6 (L) 26.0 - 34.0 pg   MCHC 30.6 30.0 - 36.0 g/dL   RDW 40.3 (H) 47.4 - 25.9 %   Platelets 735 (H) 150 - 400 K/uL   nRBC 0.0 0.0 - 0.2 %   Neutrophils Relative % 82 %   Neutro Abs 10.1 (H) 1.7 - 7.7 K/uL   Lymphocytes Relative 11 %   Lymphs Abs 1.3 0.7 - 4.0 K/uL   Monocytes Relative 6 %   Monocytes Absolute 0.7 0.1 - 1.0 K/uL   Eosinophils Relative 0 %   Eosinophils Absolute 0.0 0.0 - 0.5 K/uL   Basophils Relative 0 %   Basophils Absolute 0.0 0.0 - 0.1 K/uL   Immature Granulocytes 1 %   Abs Immature Granulocytes 0.13 (  H) 0.00 - 0.07 K/uL    Comment: Performed at [facility], [address] [place], [place] [address]  Ammonia     Status: None   Collection Time: 07/21/23  7:17 PM  Result Value Ref Range   Ammonia <10 9 - 35 umol/L    Comment: Performed at [facility], [address], [place], [place] [address]  Basic metabolic panel     Status: Abnormal   Collection Time: 07/21/23  7:17 PM  Result Value Ref Range   Sodium 132 (L) 135 - 145 mmol/L   Potassium 2.0 (LL) 3.5 - 5.1 mmol/L    Comment: CRITICAL RESULT CALLED TO, READ BACK BY AND VERIFIED WITH J. [facility] AT 2056 ON 10.07.24 BY ADGER J   Chloride 94 (L) 98 - 111 mmol/L   CO2 25 22 - 32 mmol/L   Glucose, Bld 114 (H) 70 - 99 mg/dL    Comment: Glucose reference range applies only to samples taken after fasting for at least 8 hours.   BUN 10 8 - 23 mg/dL    Creatinine, Ser 0.98 (H) 0.44 - 1.00 mg/dL   Calcium 11.9 (H) 8.9 - 10.3 mg/dL   GFR, Estimated 60 (L) >60 mL/min    Comment: (NOTE) Calculated using the CKD-EPI Creatinine Equation (2021)    Anion gap 13 5 - 15    Comment: Performed at [facility] [address], [place], [place] [address]  Hepatic function panel     Status: Abnormal   Collection Time: 07/21/23  7:17 PM  Result Value Ref Range   Total Protein 6.8 6.5 - 8.1 g/dL   Albumin 2.6 (L) 3.5 - 5.0 g/dL   AST 24 15 - 41 U/L   ALT 11 0 - 44 U/L   Alkaline Phosphatase 93 38 - 126 U/L   Total Bilirubin 0.7 0.3 - 1.2 mg/dL   Bilirubin, Direct 0.1 0.0 - 0.2 mg/dL   Indirect Bilirubin 0.6 0.3 - 0.9 mg/dL    Comment: Performed at [facility], [address], [place], [place] [address]  Magnesium     Status: None   Collection Time: 07/21/23  7:17 PM  Result Value Ref Range   Magnesium 2.3 1.7 - 2.4 mg/dL    Comment: Performed at [facility], [address], [place], [place] [address]  Blood gas, venous     Status: Abnormal   Collection Time: 07/21/23  7:17 PM  Result Value Ref Range   pH, Ven 7.4 7.25 - 7.43   pCO2, Ven 48 44 - 60 mmHg   pO2, Ven <31 (LL) 32 - 45 mmHg    Comment: CRITICAL RESULT CALLED TO, READ BACK BY AND VERIFIED WITH: M.RAGLAND AT 2034 ON 10.07.24 BY ADGER J     Bicarbonate 29.7 (H) 20.0 - 28.0 mmol/L   Acid-Base Excess 4.0 (H) 0.0 - 2.0 mmol/L   O2 Saturation 42.1 %   Patient temperature 36.5    Collection site RIGHT ANTECUBITAL    Drawn by [id number]     Comment: Performed at [facility], [address], [place], [place] [address]  Ethanol     Status: None   Collection Time: 07/21/23  7:17 PM  Result Value Ref Range   Alcohol, Ethyl (B) <10 <10 mg/dL    Comment: (NOTE) Lowest detectable limit for serum alcohol is 10 mg/dL.  For medical purposes only. Performed at [facility], [address], [place], [place] [address]   Troponin I (High Sensitivity)  Status: None   Collection Time: 07/21/23   7:17 PM  Result Value Ref Range   Troponin I (High Sensitivity) 8 <18 ng/L    Comment: (NOTE) Elevated high sensitivity troponin I (hsTnI) values and significant  changes across serial measurements may suggest ACS but many other  chronic and acute conditions are known to elevate hsTnI results.  Refer to the "Links" section for chest pain algorithms and additional  guidance. Performed at [facility], [address], [place], [place] [address]   CBG monitoring, ED     Status: Abnormal   Collection Time: 07/21/23  7:20 PM  Result Value Ref Range   Glucose-Capillary 107 (H) 70 - 99 mg/dL    Comment: Glucose reference range applies only to samples taken after fasting for at least 8 hours.  I-stat chem 8, ED     Status: Abnormal   Collection Time: 07/21/23  7:24 PM  Result Value Ref Range   Sodium 134 (L) 135 - 145 mmol/L   Potassium 2.2 (LL) 3.5 - 5.1 mmol/L   Chloride 98 98 - 111 mmol/L   BUN 8 8 - 23 mg/dL   Creatinine, Ser 6.04 (H) 0.44 - 1.00 mg/dL   Glucose, Bld 540 (H) 70 - 99 mg/dL    Comment: Glucose reference range applies only to samples taken after fasting for at least 8 hours.   Calcium, Ion 1.47 (H) 1.15 - 1.40 mmol/L   TCO2 25 22 - 32 mmol/L   Hemoglobin 9.5 (L) 12.0 - 15.0 g/dL   HCT 98.1 (L) 19.1 - 47.8 %   Comment NOTIFIED PHYSICIAN   Troponin I (High Sensitivity)     Status: None   Collection Time: 07/21/23  9:09 PM  Result Value Ref Range   Troponin I (High Sensitivity) 8 <18 ng/L    Comment: (NOTE) Elevated high sensitivity troponin I (hsTnI) values and significant  changes across serial measurements may suggest ACS but many other  chronic and acute conditions are known to elevate hsTnI results.  Refer to the "Links" section for chest pain algorithms and additional  guidance. Performed at [facility], [address] [place], [place] [address]   Urinalysis, Routine w reflex microscopic -Urine, Clean Catch     Status: Abnormal   Collection Time: 07/22/23   4:33 AM  Result Value Ref Range   Color, Urine YELLOW YELLOW   APPearance CLEAR CLEAR   Specific Gravity, Urine 1.013 1.005 - 1.030   pH 6.0 5.0 - 8.0   Glucose, UA NEGATIVE NEGATIVE mg/dL   Hgb urine dipstick SMALL (A) NEGATIVE   Bilirubin Urine NEGATIVE NEGATIVE   Ketones, ur 20 (A) NEGATIVE mg/dL   Protein, ur 130 (A) NEGATIVE mg/dL   Nitrite NEGATIVE NEGATIVE   Leukocytes,Ua NEGATIVE NEGATIVE   RBC / HPF 0-5 0 - 5 RBC/hpf   WBC, UA 0-5 0 - 5 WBC/hpf   Bacteria, UA MANY (A) NONE SEEN   Squamous Epithelial / HPF 0-5 0 - 5 /HPF   Mucus PRESENT     Comment: Performed at [facility], [address] [place], [place] [address]  Urine rapid drug screen (hosp performed)     Status: Abnormal   Collection Time: 07/22/23  4:33 AM  Result Value Ref Range   Opiates NONE DETECTED NONE DETECTED   Cocaine NONE DETECTED NONE DETECTED   Benzodiazepines POSITIVE (A) NONE DETECTED   Amphetamines NONE DETECTED NONE DETECTED   Tetrahydrocannabinol NONE DETECTED NONE DETECTED   Barbiturates NONE DETECTED NONE  DETECTED    Comment: (NOTE) DRUG SCREEN FOR MEDICAL PURPOSES ONLY.  IF CONFIRMATION IS NEEDED FOR ANY PURPOSE, NOTIFY LAB WITHIN 5 DAYS.  LOWEST DETECTABLE LIMITS FOR URINE DRUG SCREEN Drug Class                     Cutoff (ng/mL) Amphetamine and metabolites    1000 Barbiturate and metabolites    200 Benzodiazepine                 200 Opiates and metabolites        300 Cocaine and metabolites        300 THC                            50 Performed at [facility], [address], [place], [place] [address]   Basic metabolic panel     Status: Abnormal   Collection Time: 07/22/23  5:46 AM  Result Value Ref Range   Sodium 133 (L) 135 - 145 mmol/L   Potassium 2.4 (LL) 3.5 - 5.1 mmol/L    Comment: DELTA CHECK NOTED CRITICAL RESULT CALLED TO, READ BACK BY AND VERIFIED WITH RUSH,C AT 6:05AM ON 07/22/23 BY FESTERMAN,C    Chloride 97 (L) 98 - 111 mmol/L   CO2 24 22 - 32 mmol/L    Glucose, Bld 102 (H) 70 - 99 mg/dL    Comment: Glucose reference range applies only to samples taken after fasting for at least 8 hours.   BUN 9 8 - 23 mg/dL   Creatinine, Ser 6.04 (H) 0.44 - 1.00 mg/dL   Calcium 54.0 8.9 - 98.1 mg/dL   GFR, Estimated >19 >14 mL/min    Comment: (NOTE) Calculated using the CKD-EPI Creatinine Equation (2021)    Anion gap 12 5 - 15    Comment: Performed at [facility], [address], [place], [place] [address]  Potassium     Status: Abnormal   Collection Time: 07/22/23 11:08 AM  Result Value Ref Range   Potassium 2.8 (L) 3.5 - 5.1 mmol/L    Comment: Performed at [facility], [address], [place], [place] [address]   CT CHEST ABDOMEN PELVIS WO CONTRAST  Result Date: 07/21/2023 CLINICAL DATA:  Unintentional weight loss. Weakness and altered mental status. EXAM: CT CHEST, ABDOMEN AND PELVIS WITHOUT CONTRAST TECHNIQUE: Multidetector CT imaging of the chest, abdomen and pelvis was performed following the standard protocol without IV contrast. RADIATION DOSE REDUCTION: This exam was performed according to the departmental dose-optimization program which includes automated exposure control, adjustment of the mA and/or kV according to patient size and/or use of iterative reconstruction technique. COMPARISON:  Chest radiograph dated 07/21/2023. FINDINGS: Evaluation of this exam is limited in the absence of intravenous contrast. CT CHEST FINDINGS Cardiovascular: There is no cardiomegaly or pericardial effusion. Mild atherosclerotic calcification of the thoracic aorta. The central pulmonary arteries are grossly unremarkable on this noncontrast CT. Mediastinum/Nodes: No hilar or mediastinal adenopathy. The esophagus is grossly unremarkable. No mediastinal fluid collection. Lungs/Pleura: Small left pleural effusion with minimal compressive atelectasis of the left lower lobe. Several scattered pulmonary nodules measure up to 9 mm consistent with metastatic disease. There is no  pneumothorax. The central airways are patent. Musculoskeletal: No acute osseous pathology. CT ABDOMEN PELVIS FINDINGS No intra-abdominal free air.  Small ascites. Hepatobiliary: Ill-defined hepatic hypodense lesions consistent with metastatic disease. No ductal dilatation. No calcified gallstone. Pancreas: The pancreas is unremarkable. Spleen: Normal  in size without focal abnormality. Adrenals/Urinary Tract: The adrenal glands are unremarkable. There is mild left hydronephrosis. Gradual transition at the ureteropelvic junction. No stone. There is no hydronephrosis or nephrolithiasis on the right. The urinary bladder is minimally distended. Stomach/Bowel: Evaluation of the bowel is limited in the absence of oral contrast. There is no bowel obstruction. Vascular/Lymphatic: Moderate aortoiliac atherosclerotic disease. The IVC is unremarkable. No portal venous gas. Mildly enlarged aortocaval lymph node measures 14 mm. Reproductive: The uterus is poorly visualized. There appears to be an ill-defined hypodense mass in the lower uterus/cervical region measuring approximately 6.7 x 6.7 cm concerning for malignancy. Other: There is extensive omental and mesenteric nodularity consistent with implants. Musculoskeletal: Degenerative changes of the lower lumbar spine. No acute osseous pathology. IMPRESSION: 1. 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 Multidisciplinary consult is advised. 2. Small malignant left pleural effusion and small ascites. 3. Mild left hydronephrosis. 4.  Aortic Atherosclerosis (ICD10-I70.0). Electronically Signed   By: Elgie Collard M.D.   On: 07/21/2023 22:53   CT HEAD WO CONTRAST  Result Date: 07/21/2023 CLINICAL DATA:  Head trauma, minor (Age >= 65y); Neck trauma (Age >= 65y) Per EMS pt drinks every night and has been drinking tonight. Pt states weakness and AMS. Per EMS pt also smells of UTI. EXAM: CT HEAD WITHOUT  CONTRAST CT CERVICAL SPINE WITHOUT CONTRAST TECHNIQUE: Multidetector CT imaging of the head and cervical spine was performed following the standard protocol without intravenous contrast. Multiplanar CT image reconstructions of the cervical spine were also generated. RADIATION DOSE REDUCTION: This exam was performed according to the departmental dose-optimization program which includes automated exposure control, adjustment of the mA and/or kV according to patient size and/or use of iterative reconstruction technique. COMPARISON:  None Available. FINDINGS: CT HEAD FINDINGS Brain: Cerebral ventricle sizes are concordant with the degree of cerebral volume loss. Patchy and confluent areas of decreased attenuation are noted throughout the deep and periventricular white matter of the cerebral hemispheres bilaterally, compatible with chronic microvascular ischemic disease. Left occipital lobe encephalomalacia. No evidence of large-territorial acute infarction. No parenchymal hemorrhage. No mass lesion. No extra-axial collection. No mass effect or midline shift. No hydrocephalus. Basilar cisterns are patent. Vascular: No hyperdense vessel. Skull: No acute fracture or focal lesion. Sinuses/Orbits: Paranasal sinuses and mastoid air cells are clear. The orbits are unremarkable. Other: None. CT CERVICAL SPINE FINDINGS Alignment: Reversal of normal cervical lordosis likely due to positioning and degenerative changes. Skull base and vertebrae: Mild to moderate degenerative changes spine most prominent at the C5 through C7 levels. No associated severe osseous neural foraminal or central canal stenosis. No acute fracture. No aggressive appearing focal osseous lesion or focal pathologic process. Soft tissues and spinal canal: No prevertebral fluid or swelling. No visible canal hematoma. Upper chest: Subpleural 0.7 cm left upper lobe pulmonary nodule. Please see separately dictated CT chest 07/21/2023. Other: Atherosclerotic plaque  of the carotid arteries within the neck. IMPRESSION: 1. No acute intracranial abnormality. 2. No acute displaced fracture or traumatic listhesis of the cervical spine. 3. Subpleural 0.7 cm left upper lobe pulmonary nodule. Please see separately dictated CT chest 07/21/2023. Electronically Signed   By: Tish Frederickson M.D.   On: 07/21/2023 22:46   CT CERVICAL SPINE WO CONTRAST  Result Date: 07/21/2023 CLINICAL DATA:  Head trauma, minor (Age >= 65y); Neck trauma (Age >= 65y) Per EMS pt drinks every night and has been drinking tonight. Pt states weakness and AMS. Per EMS  pt also smells of UTI. EXAM: CT HEAD WITHOUT CONTRAST CT CERVICAL SPINE WITHOUT CONTRAST TECHNIQUE: Multidetector CT imaging of the head and cervical spine was performed following the standard protocol without intravenous contrast. Multiplanar CT image reconstructions of the cervical spine were also generated. RADIATION DOSE REDUCTION: This exam was performed according to the departmental dose-optimization program which includes automated exposure control, adjustment of the mA and/or kV according to patient size and/or use of iterative reconstruction technique. COMPARISON:  None Available. FINDINGS: CT HEAD FINDINGS Brain: Cerebral ventricle sizes are concordant with the degree of cerebral volume loss. Patchy and confluent areas of decreased attenuation are noted throughout the deep and periventricular white matter of the cerebral hemispheres bilaterally, compatible with chronic microvascular ischemic disease. Left occipital lobe encephalomalacia. No evidence of large-territorial acute infarction. No parenchymal hemorrhage. No mass lesion. No extra-axial collection. No mass effect or midline shift. No hydrocephalus. Basilar cisterns are patent. Vascular: No hyperdense vessel. Skull: No acute fracture or focal lesion. Sinuses/Orbits: Paranasal sinuses and mastoid air cells are clear. The orbits are unremarkable. Other: None. CT CERVICAL SPINE  FINDINGS Alignment: Reversal of normal cervical lordosis likely due to positioning and degenerative changes. Skull base and vertebrae: Mild to moderate degenerative changes spine most prominent at the C5 through C7 levels. No associated severe osseous neural foraminal or central canal stenosis. No acute fracture. No aggressive appearing focal osseous lesion or focal pathologic process. Soft tissues and spinal canal: No prevertebral fluid or swelling. No visible canal hematoma. Upper chest: Subpleural 0.7 cm left upper lobe pulmonary nodule. Please see separately dictated CT chest 07/21/2023. Other: Atherosclerotic plaque of the carotid arteries within the neck. IMPRESSION: 1. No acute intracranial abnormality. 2. No acute displaced fracture or traumatic listhesis of the cervical spine. 3. Subpleural 0.7 cm left upper lobe pulmonary nodule. Please see separately dictated CT chest 07/21/2023. Electronically Signed   By: Tish Frederickson M.D.   On: 07/21/2023 22:46   DG Chest Port 1 View  Result Date: 07/21/2023 CLINICAL DATA:  Fall, weakness EXAM: PORTABLE CHEST 1 VIEW COMPARISON:  06/03/2023 FINDINGS: The heart size and mediastinal contours are within normal limits. Both lungs are clear. Unchanged eventration of the right hemidiaphragm. The visualized skeletal structures are unremarkable. IMPRESSION: No acute abnormality of the lungs in AP portable projection. Electronically Signed   By: Jearld Lesch M.D.   On: 07/21/2023 20:35    Pending Labs Unresulted Labs (From admission, onward)     Start     Ordered   07/29/23 0500  Creatinine, serum  (enoxaparin (LOVENOX)    CrCl >/= 30 ml/min)  Weekly,   R     Comments: while on enoxaparin therapy    07/22/23 1046   07/23/23 0500  Magnesium  Tomorrow morning,   R        07/22/23 1046   07/23/23 0500  Comprehensive metabolic panel  Tomorrow morning,   R        07/22/23 1046   07/23/23 0500  CBC  Tomorrow morning,   R        07/22/23 1046   07/22/23 1153   MRSA Next Gen by PCR, Nasal  Once,   R        07/22/23 1152            Vitals/Pain Today's Vitals   07/22/23 0856 07/22/23 0856 07/22/23 1315 07/22/23 1330  BP:  (!) 150/74 (!) 170/122 (!) 161/71  Pulse:  94 96 94  Resp:  18 16 (!)  22  Temp: 97.8 F (36.6 C)   97.9 F (36.6 C)  TempSrc: Oral   Oral  SpO2:  96% 95% 93%  Weight:      Height:      PainSc:  4       Isolation Precautions No active isolations  Medications Medications  amLODipine (NORVASC) tablet 5 mg (5 mg Oral Given 07/22/23 1327)  ALPRAZolam (XANAX) tablet 1 mg (1 mg Oral Given 07/22/23 1529)  FLUoxetine (PROZAC) capsule 60 mg (has no administration in time range)  nicotine (NICODERM CQ - dosed in mg/24 hours) patch 21 mg (21 mg Transdermal Patient Refused/Not Given 07/22/23 1429)  mometasone-formoterol (DULERA) 100-5 MCG/ACT inhaler 2 puff (has no administration in time range)  enoxaparin (LOVENOX) injection 40 mg (has no administration in time range)  acetaminophen (TYLENOL) tablet 650 mg (has no administration in time range)    Or  acetaminophen (TYLENOL) suppository 650 mg (has no administration in time range)  potassium chloride 10 mEq in 100 mL IVPB (10 mEq Intravenous New Bag/Given 07/22/23 1529)  lactated ringers bolus 1,000 mL (0 mLs Intravenous Stopped 07/21/23 1955)  potassium chloride 10 mEq in 100 mL IVPB (0 mEq Intravenous Stopped 07/22/23 0520)  potassium chloride SA (KLOR-CON M) CR tablet 40 mEq (40 mEq Oral Given 07/22/23 0650)  potassium chloride 10 mEq in 100 mL IVPB (0 mEq Intravenous Stopped 07/22/23 1237)    Mobility manual wheelchair     Focused Assessments Cardiac Assessment Handoff:    No results found for: "CKTOTAL", "CKMB", "CKMBINDEX", "TROPONINI" No results found for: "DDIMER" Does the Patient currently have chest pain? No    R Recommendations: See Admitting Provider Note  Report given to:   Additional Notes: Pt has no appetite. Pt normally ambulatory but extremely weak  currently.

## 2023-07-22 NOTE — Progress Notes (Signed)
   07/22/23 1036  TOC Brief Assessment  Insurance and Status Reviewed  Patient has primary care physician Yes  Home environment has been reviewed Home  Prior level of function: Independent  Prior/Current Home Services No current home services  Social Determinants of Health Reivew SDOH reviewed no interventions necessary  Readmission risk has been reviewed Yes  Transition of care needs no transition of care needs at this time   Transition of Care Department [facility]) has reviewed patient and no TOC needs have been identified at this time. We will continue to monitor patient advancement through interdisciplinary progression rounds. If new patient transition needs arise, please place a TOC consult.

## 2023-07-23 DIAGNOSIS — C531 Malignant neoplasm of exocervix: Secondary | ICD-10-CM

## 2023-07-23 DIAGNOSIS — E876 Hypokalemia: Secondary | ICD-10-CM | POA: Diagnosis not present

## 2023-07-23 LAB — COMPREHENSIVE METABOLIC PANEL
ALT: 14 U/L (ref 0–44)
AST: 25 U/L (ref 15–41)
Albumin: 2.6 g/dL — ABNORMAL LOW (ref 3.5–5.0)
Alkaline Phosphatase: 89 U/L (ref 38–126)
Anion gap: 12 (ref 5–15)
BUN: 6 mg/dL — ABNORMAL LOW (ref 8–23)
CO2: 24 mmol/L (ref 22–32)
Calcium: 10 mg/dL (ref 8.9–10.3)
Chloride: 97 mmol/L — ABNORMAL LOW (ref 98–111)
Creatinine, Ser: 0.83 mg/dL (ref 0.44–1.00)
GFR, Estimated: >60 mL/min (ref >60)
Glucose, Bld: 110 mg/dL — ABNORMAL HIGH (ref 70–99)
Potassium: 2.8 mmol/L — ABNORMAL LOW (ref 3.5–5.1)
Sodium: 133 mmol/L — ABNORMAL LOW (ref 135–145)
Total Bilirubin: 0.8 mg/dL (ref 0.3–1.2)
Total Protein: 6.5 g/dL (ref 6.5–8.1)

## 2023-07-23 LAB — CBC
HCT: 27.2 % — ABNORMAL LOW (ref 36.0–46.0)
Hemoglobin: 8.4 g/dL — ABNORMAL LOW (ref 12.0–15.0)
MCH: 23.9 pg — ABNORMAL LOW (ref 26.0–34.0)
MCHC: 30.9 g/dL (ref 30.0–36.0)
MCV: 77.3 fL — ABNORMAL LOW (ref 80.0–100.0)
Platelets: 688 10*3/uL — ABNORMAL HIGH (ref 150–400)
RBC: 3.52 MIL/uL — ABNORMAL LOW (ref 3.87–5.11)
RDW: 17.9 % — ABNORMAL HIGH (ref 11.5–15.5)
WBC: 14.3 10*3/uL — ABNORMAL HIGH (ref 4.0–10.5)
nRBC: 0 % (ref 0.0–0.2)

## 2023-07-23 LAB — MAGNESIUM: Magnesium: 2 mg/dL (ref 1.7–2.4)

## 2023-07-23 LAB — MRSA NEXT GEN BY PCR, NASAL: MRSA by PCR Next Gen: DETECTED — AB

## 2023-07-23 MED ORDER — LORAZEPAM 2 MG/ML IJ SOLN: 1.0000 mg | INTRAMUSCULAR | Status: DC | PRN

## 2023-07-23 MED ORDER — MUPIROCIN 2 % EX OINT
1.0000 | TOPICAL_OINTMENT | Freq: Two times a day (BID) | CUTANEOUS | Status: AC
  Administered 2023-07-23 – 2023-07-27 (×10): 1 via NASAL
  Filled 2023-07-23 (×2): qty 22

## 2023-07-23 MED ORDER — MAGNESIUM SULFATE 2 GM/50ML IV SOLN
2.0000 g | Freq: Once | INTRAVENOUS | Status: AC
  Administered 2023-07-23: 2 g via INTRAVENOUS
  Filled 2023-07-23: qty 50

## 2023-07-23 MED ORDER — THIAMINE MONONITRATE 100 MG PO TABS
100.0000 mg | ORAL_TABLET | Freq: Every day | ORAL | Status: DC
  Administered 2023-07-23 – 2023-07-29 (×7): 100 mg via ORAL
  Filled 2023-07-23 (×8): qty 1

## 2023-07-23 MED ORDER — FOLIC ACID 1 MG PO TABS
1.0000 mg | ORAL_TABLET | Freq: Every day | ORAL | Status: DC
  Administered 2023-07-23 – 2023-07-29 (×7): 1 mg via ORAL
  Filled 2023-07-23 (×7): qty 1

## 2023-07-23 MED ORDER — ONDANSETRON HCL 4 MG/2ML IJ SOLN
4.0000 mg | Freq: Four times a day (QID) | INTRAMUSCULAR | Status: DC
  Administered 2023-07-23 – 2023-07-29 (×19): 4 mg via INTRAVENOUS
  Filled 2023-07-23 (×21): qty 2

## 2023-07-23 MED ORDER — POTASSIUM CHLORIDE 10 MEQ/100ML IV SOLN
10.0000 meq | INTRAVENOUS | Status: AC
  Administered 2023-07-23 (×4): 10 meq via INTRAVENOUS
  Filled 2023-07-23 (×4): qty 100

## 2023-07-23 MED ORDER — KCL IN DEXTROSE-NACL 20-5-0.9 MEQ/L-%-% IV SOLN: INTRAVENOUS | Status: DC

## 2023-07-23 NOTE — Hospital Course (Addendum)
Kimberly Adkins is a 67 y.o. female with medical history significant for depression/anxiety, prior alcohol use, tobacco abuse, COPD, GERD, dyslipidemia, hypertension, osteoarthritis, and "ovary problem" who states that she has had generalized weakness over the last several days which has led to over 8 falls.  She denies any new pain or injury.  She states she has not been taking her home potassium and does not state a reason why.  She has had an episode of nausea and vomiting as well as diarrhea.  She was also noted to have some mild confusion and states that she usually has some mild abdominal distention.   ED Course: Vital signs stable and patient afebrile.  Potassium noted to be 2.4 and she has been started on repletion.  She has also been started on some IV fluid in the ED.  Hemoglobin 9.5.

## 2023-07-23 NOTE — [place]2 (Signed)
[place] MEDICAID [place]2 LEVEL OF CARE FORM     IDENTIFICATION  Patient Name: Kimberly Adkins Birthdate: 1956-01-15 Sex: female Admission Date (Current Location): 07/21/2023  [facility] and [place] Number:  [employer] and Address:  [facility],  [address], [place] [address]      Provider Number: [id number]  Attending Physician Name and Address:  Kendell Bane, MD  Relative Name and Phone Number:  Vanessa Kick (Sister)  [phone]    Current Level of Care: Hospital Recommended Level of Care: Skilled Nursing Facility Prior Approval Number:    Date Approved/Denied:   PASRR Number: Pending  Discharge Plan: SNF    Current Diagnoses: Patient Active Problem List   Diagnosis Date Noted   AKI (acute kidney injury) (HCC) 06/04/2023   Prolonged QT interval 06/04/2023   Hypokalemia 06/04/2023   UTI (urinary tract infection) 06/04/2023   Dehydration 06/04/2023   Acute metabolic encephalopathy 06/03/2023   MDD (major depressive disorder), recurrent severe, without psychosis (HCC) 12/07/2022   Alcohol use disorder 08/29/2022   Benzodiazepine dependence, continuous (HCC) 08/29/2022   MDD (major depressive disorder), recurrent episode, mild (HCC) 08/29/2022   Moderate episode of recurrent major depressive disorder (HCC) 07/26/2020   Benzodiazepine dependence (HCC) 04/28/2020   Allergic rhinitis 08/08/2014   Actinic keratoses 02/16/2013   Dyslipidemia 02/16/2013   Well adult exam 07/15/2012   Hand pain, right 06/04/2012   Hypertension    Hematochezia 01/21/2012   Elevated blood pressure 01/21/2012   Bronchitis 12/09/2011   COPD (chronic obstructive pulmonary disease) (HCC) 12/09/2011   Herpes labialis 11/03/2011   FURUNCLE 11/07/2009   PTSD 09/21/2009   HOARSENESS 09/21/2009   HYPERLIPIDEMIA 07/14/2009   Anxiety state 07/14/2009   Obsessive-compulsive disorder 07/14/2009   TOBACCO USE DISORDER/SMOKER-SMOKING CESSATION DISCUSSED 07/14/2009    GRIEF REACTION 07/14/2009   MDD (major depressive disorder) 07/14/2009   GERD 07/14/2009   Osteoarthritis 07/14/2009    Orientation RESPIRATION BLADDER Height & Weight     Self, Place  Normal External catheter Weight: 72.7 kg Height:  5\' 4"  (162.6 cm)  BEHAVIORAL SYMPTOMS/MOOD NEUROLOGICAL BOWEL NUTRITION STATUS      Continent Diet (See DC summary)  AMBULATORY STATUS COMMUNICATION OF NEEDS Skin   Extensive Assist Verbally Normal                       Personal Care Assistance Level of Assistance  Bathing, Feeding, Dressing Bathing Assistance: Maximum assistance Feeding assistance: Limited assistance Dressing Assistance: Maximum assistance     Functional Limitations Info  Sight, Hearing, Speech Sight Info: Adequate Hearing Info: Adequate Speech Info: Adequate    SPECIAL CARE FACTORS FREQUENCY  PT (By licensed PT)     PT Frequency: 5 times a week              Contractures Contractures Info: Not present    Additional Factors Info  Code Status Code Status Info: Full             Current Medications (07/23/2023):  This is the current hospital active medication list Current Facility-Administered Medications  Medication Dose Route Frequency Provider Last Rate Last Admin   acetaminophen (TYLENOL) tablet 650 mg  650 mg Oral Q6H PRN Sherryll Burger, Pratik D, DO       Or   acetaminophen (TYLENOL) suppository 650 mg  650 mg Rectal Q6H PRN Sherryll Burger, Pratik D, DO       ALPRAZolam Prudy Feeler) tablet 1 mg  1 mg Oral TID Maurilio Lovely D,  DO   1 mg at 07/23/23 0907   amLODipine (NORVASC) tablet 5 mg  5 mg Oral Daily Sherryll Burger, Pratik D, DO   5 mg at 07/23/23 0865   Chlorhexidine Gluconate Cloth 2 % PADS 6 each  6 each Topical Daily Maurilio Lovely D, DO   6 each at 07/23/23 0907   dextrose 5 % and 0.9 % NaCl with KCl 20 mEq/L infusion   Intravenous Continuous Shahmehdi, Seyed A, MD       FLUoxetine (PROZAC) capsule 60 mg  60 mg Oral Daily Sherryll Burger, Pratik D, DO   60 mg at 07/22/23 2125   folic  acid (FOLVITE) tablet 1 mg  1 mg Oral Daily Shahmehdi, Seyed A, MD   1 mg at 07/23/23 0906   LORazepam (ATIVAN) injection 1-2 mg  1-2 mg Intravenous Q1H PRN Kendell Bane, MD       mometasone-formoterol (DULERA) 100-5 MCG/ACT inhaler 2 puff  2 puff Inhalation BID Sherryll Burger, Pratik D, DO   2 puff at 07/23/23 0715   mupirocin ointment (BACTROBAN) 2 % 1 Application  1 Application Nasal BID Maurilio Lovely D, DO   1 Application at 07/23/23 7846   nicotine (NICODERM CQ - dosed in mg/24 hours) patch 21 mg  21 mg Transdermal Daily Sherryll Burger, Pratik D, DO   21 mg at 07/23/23 0906   ondansetron (ZOFRAN) injection 4 mg  4 mg Intravenous Q6H Shahmehdi, Seyed A, MD       thiamine (VITAMIN B1) tablet 100 mg  100 mg Oral Daily Shahmehdi, Seyed A, MD   100 mg at 07/23/23 0907     Discharge Medications: Please see discharge summary for a list of discharge medications.  Relevant Imaging Results:  Relevant Lab Results:   Additional Information SS# [id number]  Leitha Bleak, RN

## 2023-07-23 NOTE — Consult Note (Signed)
Reason for Consult:concern for cervical cancer Referring Physician: Dr Maurilio Lovely  Kimberly Adkins is an 67 y.o. female.     67 yo post menopausal woman withy multiple medical problems admitted with hypokalemia and weakness + anemia CT scan revealed a lower segment mass concerning for a cervical cancer + evidence of extra pelvic disease with enlarged aortic/caval nodes and ascites + pleural effusion I was asked to see the patient for confirmation  Poor historian, withdrawing? Encephalopathic? But she does say she has been having bleeding over the past 6 months give or take and she has had pelvic pain("pain in my ovaries")  Exam reveals a large exophytic lesion extending at elast halfway down the vagina and involving the parametrium I am somewhat surprised given the size of the mass there is not ureteral compression and or hydronephrosis on CT scan  I will discuss with Drs Alvester Morin and Pricilla Holm, gyn oncology, and get their thoughts on triage, tissue diagnosis, etc for care of this patient    Past Medical History:  Diagnosis Date   Anxiety    Depression    GERD (gastroesophageal reflux disease)    Hyperlipidemia    Hypertension    Osteoarthritis    PTSD (post-traumatic stress disorder) 2010    Past Surgical History:  Procedure Laterality Date   ANTERIOR AND POSTERIOR REPAIR  2007   BREAST BIOPSY  1997   right; benign   TUBAL LIGATION  1994    Family History  Problem Relation Age of Onset   Breast cancer Paternal Grandmother        Age 29   Colon polyps Paternal Grandmother 64   Colon polyps Maternal Grandfather 45   Cancer Mother        Breast cancer   Breast cancer Mother        Age 92   Coronary artery disease Father    Hypertension Father    Breast cancer Paternal Aunt        Age 2's   Cancer Maternal Grandmother        colon   Heart attack Daughter     Social History:  reports that she has been smoking cigarettes. She has a 33 pack-year smoking history. She  has never used smokeless tobacco. She reports current alcohol use. She reports that she does not use drugs.  Allergies:  Allergies  Allergen Reactions   Hydromorphone Itching    Medications: I have reviewed the patient's current medications.  Review of Systems  Blood pressure (!) 148/58, pulse 85, temperature 98.6 F (37 C), temperature source Oral, resp. rate (!) 21, height 5\' 4"  (1.626 m), weight 72.7 kg, SpO2 95%. Physical Exam  Results for orders placed or performed during the hospital encounter of 07/21/23 (from the past 48 hour(s))  CBC WITH DIFFERENTIAL     Status: Abnormal   Collection Time: 07/21/23  7:17 PM  Result Value Ref Range   WBC 12.2 (H) 4.0 - 10.5 K/uL   RBC 3.64 (L) 3.87 - 5.11 MIL/uL   Hemoglobin 8.6 (L) 12.0 - 15.0 g/dL    Comment: Reticulocyte Hemoglobin testing may be clinically indicated, consider ordering this additional test [id number]    HCT 28.1 (L) 36.0 - 46.0 %   MCV 77.2 (L) 80.0 - 100.0 fL   MCH 23.6 (L) 26.0 - 34.0 pg   MCHC 30.6 30.0 - 36.0 g/dL   RDW 47.8 (H) 29.5 - 62.1 %   Platelets 735 (H) 150 - 400 K/uL   nRBC  0.0 0.0 - 0.2 %   Neutrophils Relative % 82 %   Neutro Abs 10.1 (H) 1.7 - 7.7 K/uL   Lymphocytes Relative 11 %   Lymphs Abs 1.3 0.7 - 4.0 K/uL   Monocytes Relative 6 %   Monocytes Absolute 0.7 0.1 - 1.0 K/uL   Eosinophils Relative 0 %   Eosinophils Absolute 0.0 0.0 - 0.5 K/uL   Basophils Relative 0 %   Basophils Absolute 0.0 0.0 - 0.1 K/uL   Immature Granulocytes 1 %   Abs Immature Granulocytes 0.13 (H) 0.00 - 0.07 K/uL    Comment: Performed at [facility], [address], [place], [place] [address]  Ammonia     Status: None   Collection Time: 07/21/23  7:17 PM  Result Value Ref Range   Ammonia <10 9 - 35 umol/L    Comment: Performed at [facility], [address] [place], [place] [address]  Basic metabolic panel     Status: Abnormal   Collection Time: 07/21/23  7:17 PM  Result Value Ref Range   Sodium 132 (L) 135 -  145 mmol/L   Potassium 2.0 (LL) 3.5 - 5.1 mmol/L    Comment: CRITICAL RESULT CALLED TO, READ BACK BY AND VERIFIED WITH [facility] AT 2056 ON 10.07.24 BY ADGER J   Chloride 94 (L) 98 - 111 mmol/L   CO2 25 22 - 32 mmol/L   Glucose, Bld 114 (H) 70 - 99 mg/dL    Comment: Glucose reference range applies only to samples taken after fasting for at least 8 hours.   BUN 10 8 - 23 mg/dL   Creatinine, Ser 0.98 (H) 0.44 - 1.00 mg/dL   Calcium 11.9 (H) 8.9 - 10.3 mg/dL   GFR, Estimated 60 (L) >60 mL/min    Comment: (NOTE) Calculated using the CKD-EPI Creatinine Equation (2021)    Anion gap 13 5 - 15    Comment: Performed at [facility], [address], [place], [place] [address]  Hepatic function panel     Status: Abnormal   Collection Time: 07/21/23  7:17 PM  Result Value Ref Range   Total Protein 6.8 6.5 - 8.1 g/dL   Albumin 2.6 (L) 3.5 - 5.0 g/dL   AST 24 15 - 41 U/L   ALT 11 0 - 44 U/L   Alkaline Phosphatase 93 38 - 126 U/L   Total Bilirubin 0.7 0.3 - 1.2 mg/dL   Bilirubin, Direct 0.1 0.0 - 0.2 mg/dL   Indirect Bilirubin 0.6 0.3 - 0.9 mg/dL    Comment: Performed at [facility], [address], [place], [place] [address]  Magnesium     Status: None   Collection Time: 07/21/23  7:17 PM  Result Value Ref Range   Magnesium 2.3 1.7 - 2.4 mg/dL    Comment: Performed at [facility], [address] [place], [place] [address]  Blood gas, venous     Status: Abnormal   Collection Time: 07/21/23  7:17 PM  Result Value Ref Range   pH, Ven 7.4 7.25 - 7.43   pCO2, Ven 48 44 - 60 mmHg   pO2, Ven <31 (LL) 32 - 45 mmHg    Comment: CRITICAL RESULT CALLED TO, READ BACK BY AND VERIFIED WITH: M.RAGLAND AT 2034 ON 10.07.24 BY ADGER J     Bicarbonate 29.7 (H) 20.0 - 28.0 mmol/L   Acid-Base Excess 4.0 (H) 0.0 - 2.0 mmol/L   O2 Saturation 42.1 %   Patient temperature 36.5    Collection  site RIGHT ANTECUBITAL    Drawn by 1610     Comment: Performed at [facility], [address] [place], [place]  [address]  Ethanol     Status: None   Collection Time: 07/21/23  7:17 PM  Result Value Ref Range   Alcohol, Ethyl (B) <10 <10 mg/dL    Comment: (NOTE) Lowest detectable limit for serum alcohol is 10 mg/dL.  For medical purposes only. Performed at [facility], [address] [place], [place] [address]   Troponin I (High Sensitivity)     Status: None   Collection Time: 07/21/23  7:17 PM  Result Value Ref Range   Troponin I (High Sensitivity) 8 <18 ng/L    Comment: (NOTE) Elevated high sensitivity troponin I (hsTnI) values and significant  changes across serial measurements may suggest ACS but many other  chronic and acute conditions are known to elevate hsTnI results.  Refer to the "Links" section for chest pain algorithms and additional  guidance. Performed at [facility], [address], [place], [place] [address]   CBG monitoring, ED     Status: Abnormal   Collection Time: 07/21/23  7:20 PM  Result Value Ref Range   Glucose-Capillary 107 (H) 70 - 99 mg/dL    Comment: Glucose reference range applies only to samples taken after fasting for at least 8 hours.  I-stat chem 8, ED     Status: Abnormal   Collection Time: 07/21/23  7:24 PM  Result Value Ref Range   Sodium 134 (L) 135 - 145 mmol/L   Potassium 2.2 (LL) 3.5 - 5.1 mmol/L   Chloride 98 98 - 111 mmol/L   BUN 8 8 - 23 mg/dL   Creatinine, Ser 8.29 (H) 0.44 - 1.00 mg/dL   Glucose, Bld 562 (H) 70 - 99 mg/dL    Comment: Glucose reference range applies only to samples taken after fasting for at least 8 hours.   Calcium, Ion 1.47 (H) 1.15 - 1.40 mmol/L   TCO2 25 22 - 32 mmol/L   Hemoglobin 9.5 (L) 12.0 - 15.0 g/dL   HCT 13.0 (L) 86.5 - 78.4 %   Comment NOTIFIED PHYSICIAN   Troponin I (High Sensitivity)     Status: None   Collection Time: 07/21/23  9:09 PM  Result Value Ref Range   Troponin I (High Sensitivity) 8 <18 ng/L    Comment: (NOTE) Elevated high sensitivity troponin I (hsTnI) values and significant  changes across  serial measurements may suggest ACS but many other  chronic and acute conditions are known to elevate hsTnI results.  Refer to the "Links" section for chest pain algorithms and additional  guidance. Performed at [facility], [address], [place], [place] [address]   Urinalysis, Routine w reflex microscopic -Urine, Clean Catch     Status: Abnormal   Collection Time: 07/22/23  4:33 AM  Result Value Ref Range   Color, Urine YELLOW YELLOW   APPearance CLEAR CLEAR   Specific Gravity, Urine 1.013 1.005 - 1.030   pH 6.0 5.0 - 8.0   Glucose, UA NEGATIVE NEGATIVE mg/dL   Hgb urine dipstick SMALL (A) NEGATIVE   Bilirubin Urine NEGATIVE NEGATIVE   Ketones, ur 20 (A) NEGATIVE mg/dL   Protein, ur 528 (A) NEGATIVE mg/dL   Nitrite NEGATIVE NEGATIVE   Leukocytes,Ua NEGATIVE NEGATIVE   RBC / HPF 0-5 0 - 5 RBC/hpf   WBC, UA 0-5 0 - 5 WBC/hpf   Bacteria, UA MANY (A) NONE SEEN   Squamous Epithelial /  HPF 0-5 0 - 5 /HPF   Mucus PRESENT     Comment: Performed at [facility], [address], [place], [place] [address]  Urine rapid drug screen (hosp performed)     Status: Abnormal   Collection Time: 07/22/23  4:33 AM  Result Value Ref Range   Opiates NONE DETECTED NONE DETECTED   Cocaine NONE DETECTED NONE DETECTED   Benzodiazepines POSITIVE (A) NONE DETECTED   Amphetamines NONE DETECTED NONE DETECTED   Tetrahydrocannabinol NONE DETECTED NONE DETECTED   Barbiturates NONE DETECTED NONE DETECTED    Comment: (NOTE) DRUG SCREEN FOR MEDICAL PURPOSES ONLY.  IF CONFIRMATION IS NEEDED FOR ANY PURPOSE, NOTIFY LAB WITHIN 5 DAYS.  LOWEST DETECTABLE LIMITS FOR URINE DRUG SCREEN Drug Class                     Cutoff (ng/mL) Amphetamine and metabolites    1000 Barbiturate and metabolites    200 Benzodiazepine                 200 Opiates and metabolites        300 Cocaine and metabolites        300 THC                            50 Performed at [facility], [address], [place], [place] [address]    Basic metabolic panel     Status: Abnormal   Collection Time: 07/22/23  5:46 AM  Result Value Ref Range   Sodium 133 (L) 135 - 145 mmol/L   Potassium 2.4 (LL) 3.5 - 5.1 mmol/L    Comment: DELTA CHECK NOTED CRITICAL RESULT CALLED TO, READ BACK BY AND VERIFIED WITH RUSH,C AT 6:05AM ON 07/22/23 BY FESTERMAN,C    Chloride 97 (L) 98 - 111 mmol/L   CO2 24 22 - 32 mmol/L   Glucose, Bld 102 (H) 70 - 99 mg/dL    Comment: Glucose reference range applies only to samples taken after fasting for at least 8 hours.   BUN 9 8 - 23 mg/dL   Creatinine, Ser 5.78 (H) 0.44 - 1.00 mg/dL   Calcium 46.9 8.9 - 62.9 mg/dL   GFR, Estimated >52 >84 mL/min    Comment: (NOTE) Calculated using the CKD-EPI Creatinine Equation (2021)    Anion gap 12 5 - 15    Comment: Performed at [facility], [address], [place], [place] [address]  Potassium     Status: Abnormal   Collection Time: 07/22/23 11:08 AM  Result Value Ref Range   Potassium 2.8 (L) 3.5 - 5.1 mmol/L    Comment: Performed at [facility], [address], [place], [place] [address]  MRSA Next Gen by PCR, Nasal     Status: Abnormal   Collection Time: 07/22/23  8:25 PM   Specimen: Nasal Mucosa; Nasal Swab  Result Value Ref Range   MRSA by PCR Next Gen DETECTED (A) NOT DETECTED    Comment: RESULT CALLED TO, READ BACK BY AND VERIFIED WITH: Shaune Leeks [phone], VIRAY,J (NOTE) The GeneXpert MRSA Assay (FDA approved for NASAL specimens only), is one component of a comprehensive MRSA colonization surveillance program. It is not intended to diagnose MRSA infection nor to guide or monitor treatment for MRSA infections. Test performance is not FDA approved in patients less than 32 years old. Performed at [facility], [address], [place], [place] [address]   Magnesium  Status: None   Collection Time: 07/23/23  4:59 AM  Result Value Ref Range   Magnesium 2.0 1.7 - 2.4 mg/dL    Comment: Performed at [facility], [address]  [place], [place] [address]  Comprehensive metabolic panel     Status: Abnormal   Collection Time: 07/23/23  4:59 AM  Result Value Ref Range   Sodium 133 (L) 135 - 145 mmol/L   Potassium 2.8 (L) 3.5 - 5.1 mmol/L   Chloride 97 (L) 98 - 111 mmol/L   CO2 24 22 - 32 mmol/L   Glucose, Bld 110 (H) 70 - 99 mg/dL    Comment: Glucose reference range applies only to samples taken after fasting for at least 8 hours.   BUN 6 (L) 8 - 23 mg/dL   Creatinine, Ser 6.04 0.44 - 1.00 mg/dL   Calcium 54.0 8.9 - 98.1 mg/dL   Total Protein 6.5 6.5 - 8.1 g/dL   Albumin 2.6 (L) 3.5 - 5.0 g/dL   AST 25 15 - 41 U/L   ALT 14 0 - 44 U/L   Alkaline Phosphatase 89 38 - 126 U/L   Total Bilirubin 0.8 0.3 - 1.2 mg/dL   GFR, Estimated >19 >14 mL/min    Comment: (NOTE) Calculated using the CKD-EPI Creatinine Equation (2021)    Anion gap 12 5 - 15    Comment: Performed at [facility], [address], [place], [place] [address]  CBC     Status: Abnormal   Collection Time: 07/23/23  4:59 AM  Result Value Ref Range   WBC 14.3 (H) 4.0 - 10.5 K/uL   RBC 3.52 (L) 3.87 - 5.11 MIL/uL   Hemoglobin 8.4 (L) 12.0 - 15.0 g/dL    Comment: Reticulocyte Hemoglobin testing may be clinically indicated, consider ordering this additional test [id number]    HCT 27.2 (L) 36.0 - 46.0 %   MCV 77.3 (L) 80.0 - 100.0 fL   MCH 23.9 (L) 26.0 - 34.0 pg   MCHC 30.9 30.0 - 36.0 g/dL   RDW 78.4 (H) 69.6 - 29.5 %   Platelets 688 (H) 150 - 400 K/uL   nRBC 0.0 0.0 - 0.2 %    Comment: Performed at [facility], [address], [place], [place] [address]    CT CHEST ABDOMEN PELVIS WO CONTRAST  Result Date: 07/21/2023 CLINICAL DATA:  Unintentional weight loss. Weakness and altered mental status. EXAM: CT CHEST, ABDOMEN AND PELVIS WITHOUT CONTRAST TECHNIQUE: Multidetector CT imaging of the chest, abdomen and pelvis was performed following the standard protocol without IV contrast. RADIATION DOSE REDUCTION: This exam was performed according to the  departmental dose-optimization program which includes automated exposure control, adjustment of the mA and/or kV according to patient size and/or use of iterative reconstruction technique. COMPARISON:  Chest radiograph dated 07/21/2023. FINDINGS: Evaluation of this exam is limited in the absence of intravenous contrast. CT CHEST FINDINGS Cardiovascular: There is no cardiomegaly or pericardial effusion. Mild atherosclerotic calcification of the thoracic aorta. The central pulmonary arteries are grossly unremarkable on this noncontrast CT. Mediastinum/Nodes: No hilar or mediastinal adenopathy. The esophagus is grossly unremarkable. No mediastinal fluid collection. Lungs/Pleura: Small left pleural effusion with minimal compressive atelectasis of the left lower lobe. Several scattered pulmonary nodules measure up to 9 mm consistent with metastatic disease. There is no pneumothorax. The central airways are patent. Musculoskeletal: No acute osseous pathology. CT ABDOMEN PELVIS FINDINGS No intra-abdominal free air.  Small ascites. Hepatobiliary: Ill-defined hepatic hypodense lesions consistent with metastatic disease. No  ductal dilatation. No calcified gallstone. Pancreas: The pancreas is unremarkable. Spleen: Normal in size without focal abnormality. Adrenals/Urinary Tract: The adrenal glands are unremarkable. There is mild left hydronephrosis. Gradual transition at the ureteropelvic junction. No stone. There is no hydronephrosis or nephrolithiasis on the right. The urinary bladder is minimally distended. Stomach/Bowel: Evaluation of the bowel is limited in the absence of oral contrast. There is no bowel obstruction. Vascular/Lymphatic: Moderate aortoiliac atherosclerotic disease. The IVC is unremarkable. No portal venous gas. Mildly enlarged aortocaval lymph node measures 14 mm. Reproductive: The uterus is poorly visualized. There appears to be an ill-defined hypodense mass in the lower uterus/cervical region measuring  approximately 6.7 x 6.7 cm concerning for malignancy. Other: There is extensive omental and mesenteric nodularity consistent with implants. Musculoskeletal: Degenerative changes of the lower lumbar spine. No acute osseous pathology. IMPRESSION: 1. 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 Multidisciplinary consult is advised. 2. Small malignant left pleural effusion and small ascites. 3. Mild left hydronephrosis. 4.  Aortic Atherosclerosis (ICD10-I70.0). Electronically Signed   By: Elgie Collard M.D.   On: 07/21/2023 22:53   CT HEAD WO CONTRAST  Result Date: 07/21/2023 CLINICAL DATA:  Head trauma, minor (Age >= 65y); Neck trauma (Age >= 65y) Per EMS pt drinks every night and has been drinking tonight. Pt states weakness and AMS. Per EMS pt also smells of UTI. EXAM: CT HEAD WITHOUT CONTRAST CT CERVICAL SPINE WITHOUT CONTRAST TECHNIQUE: Multidetector CT imaging of the head and cervical spine was performed following the standard protocol without intravenous contrast. Multiplanar CT image reconstructions of the cervical spine were also generated. RADIATION DOSE REDUCTION: This exam was performed according to the departmental dose-optimization program which includes automated exposure control, adjustment of the mA and/or kV according to patient size and/or use of iterative reconstruction technique. COMPARISON:  None Available. FINDINGS: CT HEAD FINDINGS Brain: Cerebral ventricle sizes are concordant with the degree of cerebral volume loss. Patchy and confluent areas of decreased attenuation are noted throughout the deep and periventricular white matter of the cerebral hemispheres bilaterally, compatible with chronic microvascular ischemic disease. Left occipital lobe encephalomalacia. No evidence of large-territorial acute infarction. No parenchymal hemorrhage. No mass lesion. No extra-axial collection. No mass effect or midline shift.  No hydrocephalus. Basilar cisterns are patent. Vascular: No hyperdense vessel. Skull: No acute fracture or focal lesion. Sinuses/Orbits: Paranasal sinuses and mastoid air cells are clear. The orbits are unremarkable. Other: None. CT CERVICAL SPINE FINDINGS Alignment: Reversal of normal cervical lordosis likely due to positioning and degenerative changes. Skull base and vertebrae: Mild to moderate degenerative changes spine most prominent at the C5 through C7 levels. No associated severe osseous neural foraminal or central canal stenosis. No acute fracture. No aggressive appearing focal osseous lesion or focal pathologic process. Soft tissues and spinal canal: No prevertebral fluid or swelling. No visible canal hematoma. Upper chest: Subpleural 0.7 cm left upper lobe pulmonary nodule. Please see separately dictated CT chest 07/21/2023. Other: Atherosclerotic plaque of the carotid arteries within the neck. IMPRESSION: 1. No acute intracranial abnormality. 2. No acute displaced fracture or traumatic listhesis of the cervical spine. 3. Subpleural 0.7 cm left upper lobe pulmonary nodule. Please see separately dictated CT chest 07/21/2023. Electronically Signed   By: Tish Frederickson M.D.   On: 07/21/2023 22:46   CT CERVICAL SPINE WO CONTRAST  Result Date: 07/21/2023 CLINICAL DATA:  Head trauma, minor (Age >= 65y); Neck trauma (Age >= 65y) Per EMS pt drinks every night  and has been drinking tonight. Pt states weakness and AMS. Per EMS pt also smells of UTI. EXAM: CT HEAD WITHOUT CONTRAST CT CERVICAL SPINE WITHOUT CONTRAST TECHNIQUE: Multidetector CT imaging of the head and cervical spine was performed following the standard protocol without intravenous contrast. Multiplanar CT image reconstructions of the cervical spine were also generated. RADIATION DOSE REDUCTION: This exam was performed according to the departmental dose-optimization program which includes automated exposure control, adjustment of the mA and/or kV  according to patient size and/or use of iterative reconstruction technique. COMPARISON:  None Available. FINDINGS: CT HEAD FINDINGS Brain: Cerebral ventricle sizes are concordant with the degree of cerebral volume loss. Patchy and confluent areas of decreased attenuation are noted throughout the deep and periventricular white matter of the cerebral hemispheres bilaterally, compatible with chronic microvascular ischemic disease. Left occipital lobe encephalomalacia. No evidence of large-territorial acute infarction. No parenchymal hemorrhage. No mass lesion. No extra-axial collection. No mass effect or midline shift. No hydrocephalus. Basilar cisterns are patent. Vascular: No hyperdense vessel. Skull: No acute fracture or focal lesion. Sinuses/Orbits: Paranasal sinuses and mastoid air cells are clear. The orbits are unremarkable. Other: None. CT CERVICAL SPINE FINDINGS Alignment: Reversal of normal cervical lordosis likely due to positioning and degenerative changes. Skull base and vertebrae: Mild to moderate degenerative changes spine most prominent at the C5 through C7 levels. No associated severe osseous neural foraminal or central canal stenosis. No acute fracture. No aggressive appearing focal osseous lesion or focal pathologic process. Soft tissues and spinal canal: No prevertebral fluid or swelling. No visible canal hematoma. Upper chest: Subpleural 0.7 cm left upper lobe pulmonary nodule. Please see separately dictated CT chest 07/21/2023. Other: Atherosclerotic plaque of the carotid arteries within the neck. IMPRESSION: 1. No acute intracranial abnormality. 2. No acute displaced fracture or traumatic listhesis of the cervical spine. 3. Subpleural 0.7 cm left upper lobe pulmonary nodule. Please see separately dictated CT chest 07/21/2023. Electronically Signed   By: Tish Frederickson M.D.   On: 07/21/2023 22:46   DG Chest Port 1 View  Result Date: 07/21/2023 CLINICAL DATA:  Fall, weakness EXAM: PORTABLE  CHEST 1 VIEW COMPARISON:  06/03/2023 FINDINGS: The heart size and mediastinal contours are within normal limits. Both lungs are clear. Unchanged eventration of the right hemidiaphragm. The visualized skeletal structures are unremarkable. IMPRESSION: No acute abnormality of the lungs in AP portable projection. Electronically Signed   By: Jearld Lesch M.D.   On: 07/21/2023 20:35    Assessment/Plan: Cervical cancer, likely stage IV  I am in contact with Gyn Oncology and getting their thoughts on best route of triage for this complicated patient  Lazaro Arms 07/23/2023

## 2023-07-23 NOTE — TOC Initial Note (Signed)
Transition of Care [facility]) - Initial/Assessment Note    Patient Details  Name: Kimberly Adkins MRN: [id number] Date of Birth: 1956/05/19  Transition of Care [facility]) CM/SW Contact:    Leitha Bleak, RN Phone Number: 07/23/2023, 2:32 PM  Clinical Narrative:   Patient admitted with hypokalemia. Patient from home lives alone with sister support. CM at the bedside. PT is recommending SNF. Patient confused. TOC asked about SNF, she stated she had been at [facility]. Patient has no PASSR #in the system. PASSR under review. TOC uploading documents requested.   CM left her sister a message to call. FL2 completed and will send out for offers.   TOC consulted to substance, When asked patient she said she does not know where that came from. She does not drink much. When ask how often, she states " 10 days"  asked 10 days month? She said " 10 days a week".  Then states she drinks a cooler with dinner. TOC added substance abuse resources to AVS.               Expected Discharge Plan: Skilled Nursing Facility Barriers to Discharge: Continued Medical Work up   Patient Goals and CMS Choice Patient states their goals for this hospitalization and ongoing recovery are:: agreeable to go to Rehab CMS Medicare.gov Compare Post Acute Care list provided to:: Patient Choice offered to / list presented to : Patient [place] ownership interest in [facility].provided to:: Patient    Expected Discharge Plan and Services     Post Acute Care Choice: Skilled Nursing Facility Living arrangements for the past 2 months: Single Family Home           Prior Living Arrangements/Services Living arrangements for the past 2 months: Single Family Home Lives with:: Self   Do you feel safe going back to the place where you live?: Yes          Activities of Daily Living   ADL Screening (condition at time of admission) Independently performs ADLs?: Yes (appropriate for developmental age) Is the patient deaf or have  difficulty hearing?: No Does the patient have difficulty seeing, even when wearing glasses/contacts?: No Does the patient have difficulty concentrating, remembering, or making decisions?: No  Permission Sought/Granted        Emotional Assessment Appearance:: Disheveled Attitude/Demeanor/Rapport: Sedated   Orientation: : Oriented to Self Alcohol / Substance Use: Not Applicable Psych Involvement: No (comment)  Admission diagnosis:  Hypokalemia [E87.6] AKI (acute kidney injury) (HCC) [N17.9] Patient Active Problem List   Diagnosis Date Noted   AKI (acute kidney injury) (HCC) 06/04/2023   Prolonged QT interval 06/04/2023   Hypokalemia 06/04/2023   UTI (urinary tract infection) 06/04/2023   Dehydration 06/04/2023   Acute metabolic encephalopathy 06/03/2023   MDD (major depressive disorder), recurrent severe, without psychosis (HCC) 12/07/2022   Alcohol use disorder 08/29/2022   Benzodiazepine dependence, continuous (HCC) 08/29/2022   MDD (major depressive disorder), recurrent episode, mild (HCC) 08/29/2022   Moderate episode of recurrent major depressive disorder (HCC) 07/26/2020   Benzodiazepine dependence (HCC) 04/28/2020   Allergic rhinitis 08/08/2014   Actinic keratoses 02/16/2013   Dyslipidemia 02/16/2013   Well adult exam 07/15/2012   Hand pain, right 06/04/2012   Hypertension    Hematochezia 01/21/2012   Elevated blood pressure 01/21/2012   Bronchitis 12/09/2011   COPD (chronic obstructive pulmonary disease) (HCC) 12/09/2011   Herpes labialis 11/03/2011   FURUNCLE 11/07/2009   PTSD 09/21/2009   HOARSENESS 09/21/2009   HYPERLIPIDEMIA 07/14/2009  Anxiety state 07/14/2009   Obsessive-compulsive disorder 07/14/2009   TOBACCO USE DISORDER/SMOKER-SMOKING CESSATION DISCUSSED 07/14/2009   GRIEF REACTION 07/14/2009   MDD (major depressive disorder) 07/14/2009   GERD 07/14/2009   Osteoarthritis 07/14/2009   PCP:  [name], NP Pharmacy:   [facility] - [facility] [address] [place] [address] Phone: [phone] Fax: [phone]  [facility] [address], [place] [address] [place] [address] Phone: [phone] Fax: [phone]  [facility] [address] [place], [place] - [address] [place] [address] Phone: [phone] Fax: [phone]  [facility] [address] [place] - [address] [place] [address] Phone: [phone] Fax: [phone]  Social Determinants of Health (SDOH) Social History: SDOH Screenings   Food Insecurity: No Food Insecurity (07/22/2023)  Housing: Medium Risk (07/22/2023)  Transportation Needs: No Transportation Needs (07/22/2023)  Utilities: Not At Risk (07/22/2023)  Recent Concern: Utilities - At Risk (06/03/2023)  Alcohol Screen: Low Risk  (12/07/2022)  Depression (PHQ2-9): High Risk (06/10/2023)  Tobacco Use: High Risk (07/21/2023)   SDOH Interventions:   Readmission Risk Interventions    07/23/2023    2:29 PM  Readmission Risk Prevention Plan  Post Dischage Appt Not Complete  Medication Screening Complete  Transportation Screening Complete

## 2023-07-23 NOTE — Progress Notes (Signed)
PROGRESS NOTE    Patient: Kimberly Adkins                            PCP: [name], NP                    DOB: August 10, 1956            DOA: 07/21/2023 LKG:[id number]             DOS: 07/23/2023, 1:02 PM   LOS: 2 days   Date of Service: The patient was seen and examined on 07/23/2023  Subjective:   The patient was seen and examined this morning. Hemodynamically stable. Had nausea vomiting, unable to tolerate p.o.  Brief Narrative:   Kimberly Adkins is a 67 y.o. female with medical history significant for depression/anxiety, prior alcohol use, tobacco abuse, COPD, GERD, dyslipidemia, hypertension, osteoarthritis, and "ovary problem" who states that she has had generalized weakness over the last several days which has led to over 8 falls.  She denies any new pain or injury.  She states she has not been taking her home potassium and does not state a reason why.  She has had an episode of nausea and vomiting as well as diarrhea.  She was also noted to have some mild confusion and states that she usually has some mild abdominal distention.   ED Course: Vital signs stable and patient afebrile.  Potassium noted to be 2.4 and she has been started on repletion.  She has also been started on some IV fluid in the ED.  Hemoglobin 9.5.    Assessment & Plan:   Principal Problem:   Hypokalemia Active Problems:   GERD   Osteoarthritis   COPD (chronic obstructive pulmonary disease) (HCC)   Hypertension   Dyslipidemia   Benzodiazepine dependence (HCC)   Alcohol use disorder   MDD (major depressive disorder), recurrent episode, mild (HCC)   Generalized weakness secondary to severe hypokalemia With persistent nausea vomiting-and episodic diarrhea -Potassium level 2.4, 2.8, 2.8 today -Repleted with IV potassium and magnesium -Apparently has been noncompliant with home potassium supplementation -Starting IV fluid for hydration -Antiemetics -Continue to replete -PT/OT evaluation    Abdominal distention secondary to concern for cervical neoplasm with metastatic disease to lungs, liver, and peritoneum -Appreciate gynecology evaluation and recommendations: CT scan has been reviewed segment mass concerning cervical cancer with evidence of extrapelvic disease and enlarged aortic/lymph nodes with ascites and pleural effusion -Dr. Despina Hidden following and in discussion with Dr. Alvester Morin Dr. Val Eagle further recommendations   Anemia -anemia of chronic disease -No overt active bleeding, patient did report to GYN of some episodic vaginal spotting and bleeding -Monitoring H&H closely    Depression/anxiety -Somewhat withdrawn  -Continue Xanax and Prozac   COPD -DuoNebs as needed -Continue Dulera   Dyslipidemia -Continue atorvastatin   Hypertension -Continue amlodipine   Tobacco abuse -Smokes 1 pack/day, counseled on cessation     DVT prophylaxis: Lovenox Code Status: Full Family Communication: None at bedside Disposition Plan: Admit for treatment of hypokalemia and further evaluation of gynecologic cancer with metastatic disease Consults called: Gynecology Dr. Despina Hidden    ----------------------------------------------------------------------------------------------------------------------------------------------- Nutritional status:  The patient's BMI is: Body mass index is 27.51 kg/m. I agree with the assessment and plan as outlined ------------------------------------------------------------------------------------------------------------------------------------------------  DVT prophylaxis:     Code Status:   Code Status: Full Code  Family Communication: No family member present at bedside- attempt will be made to  update daily -Advance care planning has been discussed.   Admission status:   Status is: Inpatient Remains inpatient appropriate because: Needing continuous IV fluids, IV antiemetics, GYN evaluation for new diagnosis of cancer with  metastases   Disposition: From  - home             Planning for discharge in 2-4 days -   Procedures:   No admission procedures for hospital encounter.   Antimicrobials:  Anti-infectives (From admission, onward)    None        Medication:   ALPRAZolam  1 mg Oral TID   amLODipine  5 mg Oral Daily   Chlorhexidine Gluconate Cloth  6 each Topical Daily   FLUoxetine  60 mg Oral Daily   folic acid  1 mg Oral Daily   mometasone-formoterol  2 puff Inhalation BID   mupirocin ointment  1 Application Nasal BID   nicotine  21 mg Transdermal Daily   ondansetron (ZOFRAN) IV  4 mg Intravenous Q6H   thiamine  100 mg Oral Daily    acetaminophen **OR** acetaminophen, LORazepam   Objective:   Vitals:   07/23/23 0800 07/23/23 0900 07/23/23 1000 07/23/23 1216  BP: (!) 157/99 (!) 158/73 (!) 148/58   Pulse: 92 91 85   Resp: (!) 21 (!) 22 (!) 21   Temp:    98.6 F (37 C)  TempSrc:    Oral  SpO2: 91% 96% 95%   Weight:      Height:        Intake/Output Summary (Last 24 hours) at 07/23/2023 1302 Last data filed at 07/23/2023 0951 Gross per 24 hour  Intake 956.5 ml  Output 750 ml  Net 206.5 ml   Filed Weights   07/21/23 1906 07/23/23 0400  Weight: 80.7 kg 72.7 kg     Physical examination:     General:  Awake, but confused, cooperative, no distress;   HEENT:  Normocephalic, PERRL, otherwise with in Normal limits   Neuro:  CNII-XII intact. , normal motor and sensation, reflexes intact   Lungs:   Clear to auscultation BL, Respirations unlabored,  No wheezes / crackles  Cardio:    S1/S2, RRR, No murmure, No Rubs or Gallops   Abdomen:  Soft, non-tender, bowel sounds active all four quadrants, no guarding or peritoneal signs.  Muscular  skeletal:  Limited exam -global generalized weaknesses - in bed, able to move all 4 extremities,   2+ pulses,  symmetric, No pitting edema  Skin:  Dry, warm to touch, negative for any Rashes,  Wounds: Please see nursing documentation           ------------------------------------------------------------------------------------------------------------------------------------------    LABs:     Latest Ref Rng & Units 07/23/2023    4:59 AM 07/21/2023    7:24 PM 07/21/2023    7:17 PM  CBC  WBC 4.0 - 10.5 K/uL 14.3   12.2   Hemoglobin 12.0 - 15.0 g/dL 8.4  9.5  8.6   Hematocrit 36.0 - 46.0 % 27.2  28.0  28.1   Platelets 150 - 400 K/uL 688   735       Latest Ref Rng & Units 07/23/2023    4:59 AM 07/22/2023   11:08 AM 07/22/2023    5:46 AM  CMP  Glucose 70 - 99 mg/dL 027   253   BUN 8 - 23 mg/dL 6   9   Creatinine 6.64 - 1.00 mg/dL 4.03   4.74   Sodium 259 - 145 mmol/L  133   133   Potassium 3.5 - 5.1 mmol/L 2.8  2.8  2.4   Chloride 98 - 111 mmol/L 97   97   CO2 22 - 32 mmol/L 24   24   Calcium 8.9 - 10.3 mg/dL 74.2   59.5   Total Protein 6.5 - 8.1 g/dL 6.5     Total Bilirubin 0.3 - 1.2 mg/dL 0.8     Alkaline Phos 38 - 126 U/L 89     AST 15 - 41 U/L 25     ALT 0 - 44 U/L 14          Micro Results Recent Results (from the past 240 hour(s))  MRSA Next Gen by PCR, Nasal     Status: Abnormal   Collection Time: 07/22/23  8:25 PM   Specimen: Nasal Mucosa; Nasal Swab  Result Value Ref Range Status   MRSA by PCR Next Gen DETECTED (A) NOT DETECTED Final    Comment: RESULT CALLED TO, READ BACK BY AND VERIFIED WITH: Shaune Leeks [phone], VIRAY,J (NOTE) The GeneXpert MRSA Assay (FDA approved for NASAL specimens only), is one component of a comprehensive MRSA colonization surveillance program. It is not intended to diagnose MRSA infection nor to guide or monitor treatment for MRSA infections. Test performance is not FDA approved in patients less than 73 years old. Performed at [facility], [address], [place], [place] [address]     Radiology Reports No results found.  SIGNED: Kendell Bane, MD, FHM. FAAFP. [facility] - [facility] Critical care time spent - 55 min.  In seeing, evaluating  and examining the patient. Reviewing medical records, labs, drawn plan of care. [facility],  Pager (please use [email or web address] to page/ text) Please use Epic Secure Chat for non-urgent communication (7AM-7PM)  If 7PM-7AM, please contact night-coverage [email or web address], 07/23/2023, 1:02 PM

## 2023-07-23 NOTE — Plan of Care (Signed)

## 2023-07-23 NOTE — TOC Progression Note (Signed)
30 Day Note    Patient Details  Name: Kimberly Adkins MRN: [id number] Date of Birth: [birth year]  Transition of Care [facility]) CM/SW Contact  Leitha Bleak, RN Phone Number: 07/23/2023, 2:50 PM  To whom it May Concern: Please be advised that the above name patient will require a short-term nursing home stay- anticipated 30 days or less rehabilitation and strengthening. The plan is for return home.      Expected Discharge Plan: Skilled Nursing Facility Barriers to Discharge: Continued Medical Work up  Expected Discharge Plan and Services     Post Acute Care Choice: Skilled Nursing Facility Living arrangements for the past 2 months: Single Family Home

## 2023-07-23 NOTE — Plan of Care (Signed)
  Problem: Acute Rehab PT Goals(only PT should resolve) Goal: Pt Will Go Supine/Side To Sit Outcome: Progressing Flowsheets (Taken 07/23/2023 1115) Pt will go Supine/Side to Sit: with minimal assist Goal: Patient Will Transfer Sit To/From Stand Outcome: Progressing Flowsheets (Taken 07/23/2023 1115) Patient will transfer sit to/from stand: with minimal assist Goal: Pt Will Transfer Bed To Chair/Chair To Bed Outcome: Progressing Flowsheets (Taken 07/23/2023 1115) Pt will Transfer Bed to Chair/Chair to Bed: with min assist Goal: Pt Will Ambulate Outcome: Progressing Flowsheets (Taken 07/23/2023 1115) Pt will Ambulate:  15 feet  with moderate assist  with rolling walker

## 2023-07-23 NOTE — Evaluation (Signed)
Physical Therapy Evaluation Patient Details Name: Kimberly Adkins MRN: [id number] DOB: 18-Mar-1956 Today's Date: 07/23/2023  History of Present Illness  Kimberly Adkins is a 67 y.o. female with medical history significant for depression/anxiety, prior alcohol use, tobacco abuse, COPD, GERD, dyslipidemia, hypertension, osteoarthritis, and "ovary problem" who states that she has had generalized weakness over the last several days which has led to over 8 falls.  She denies any new pain or injury.  She states she has not been taking her home potassium and does not state a reason why.  She has had an episode of nausea and vomiting as well as diarrhea.  She was also noted to have some mild confusion and states that she usually has some mild abdominal distention.    Clinical Impression  Per nursing patient has been confused.  She is not able to answer questions appropriately today regarding her home situation, pain, etc. And no family at bedside.   Patient needs moderate assist and max cueing to for supine to sit with HOB slightly elevated.  She initially needs moderate assist for sitting up on the edge of the bed but able to sit with CGA only for about 30 sec; tends to lean posteriorly due to trunk weakness.  Sit to stand to RW with moderate assist and max cues.  Patient confused and needs commands repeated and physical and tactile cues.  Patient returns to sitting and needs moderate assist for legs to return to lying.  patient left in bed with call button in reach and bed alarm set and nursing notified of mobility status       If plan is discharge home, recommend the following: A lot of help with walking and/or transfers;A lot of help with bathing/dressing/bathroom;Help with stairs or ramp for entrance   Can travel by private vehicle   No    Equipment Recommendations None recommended by PT  Recommendations for Other Services       Functional Status Assessment Patient has had a recent decline in  their functional status and demonstrates the ability to make significant improvements in function in a reasonable and predictable amount of time.     Precautions / Restrictions Precautions Precautions: Fall Restrictions Weight Bearing Restrictions: No      Mobility  Bed Mobility Overal bed mobility: Needs Assistance Bed Mobility: Supine to Sit, Sit to Supine     Supine to sit: Mod assist Sit to supine: Mod assist   General bed mobility comments: needs assist to bring trunk up fully to sitting; needs assist to for legs with all bed mobility Patient Response: Flat affect  Transfers Overall transfer level: Needs assistance Equipment used: Rolling walker (2 wheels) Transfers: Sit to/from Stand Sit to Stand: Mod assist           General transfer comment: mod assit for sit to stand and max cueing; multiple requests needed    Ambulation/Gait               General Gait Details: did not attempt ambulation due to patient confusion  Stairs            Wheelchair Mobility     Tilt Bed Tilt Bed Patient Response: Flat affect  Modified Rankin (Stroke Patients Only)       Balance Overall balance assessment: Needs assistance Sitting-balance support: Bilateral upper extremity supported, Feet supported Sitting balance-Leahy Scale: Fair Sitting balance - Comments: fair sitting balance; initially needs mod assist but able to sit with CGA for about 30  sec Postural control: Posterior lean Standing balance support: Bilateral upper extremity supported, Reliant on assistive device for balance, During functional activity Standing balance-Leahy Scale: Fair Standing balance comment: fair standing balance with RW                             Pertinent Vitals/Pain Pain Assessment Pain Assessment: Faces Faces Pain Scale: Hurts a little bit Pain Intervention(s): Monitored during session    Home Living Family/patient expects to be discharged to:: Other  (Comment)                   Additional Comments: patient confused today; unable to state the status of her living situation    Prior Function                 ADLs Comments: patient unable to give history today     Extremity/Trunk Assessment   Upper Extremity Assessment Upper Extremity Assessment: Generalized weakness    Lower Extremity Assessment Lower Extremity Assessment: Generalized weakness    Cervical / Trunk Assessment Cervical / Trunk Assessment: Normal  Communication   Communication Communication: Difficulty following commands/understanding;Difficulty communicating thoughts/reduced clarity of speech Following commands: Follows one step commands inconsistently Cueing Techniques: Verbal cues;Tactile cues;Gestural cues;Visual cues  Cognition Arousal: Lethargic Behavior During Therapy:  (confused; difficulty following commands) Overall Cognitive Status: No family/caregiver present to determine baseline cognitive functioning                                 General Comments: confused        General Comments      Exercises     Assessment/Plan    PT Assessment Patient needs continued PT services  PT Problem List Decreased strength;Decreased cognition;Decreased activity tolerance;Decreased balance;Decreased mobility       PT Treatment Interventions Gait training;Functional mobility training;Therapeutic activities;Therapeutic exercise;Balance training;Patient/family education    PT Goals (Current goals can be found in the Care Plan section)  Acute Rehab PT Goals Patient Stated Goal: patient unable to state goal PT Goal Formulation: With patient Time For Goal Achievement: 08/06/23    Frequency Min 3X/week     Co-evaluation               AM-PAC PT "6 Clicks" Mobility  Outcome Measure Help needed turning from your back to your side while in a flat bed without using bedrails?: A Lot Help needed moving from lying on your back  to sitting on the side of a flat bed without using bedrails?: A Lot Help needed moving to and from a bed to a chair (including a wheelchair)?: A Lot Help needed standing up from a chair using your arms (e.g., wheelchair or bedside chair)?: A Lot Help needed to walk in hospital room?: A Lot Help needed climbing 3-5 steps with a railing? : Total 6 Click Score: 11    End of Session   Activity Tolerance: Other (comment) (limited by confusion) Patient left: in bed;with call bell/phone within reach;with bed alarm set Nurse Communication: Mobility status PT Visit Diagnosis: Unsteadiness on feet (R26.81);Repeated falls (R29.6);Muscle weakness (generalized) (M62.81);History of falling (Z91.81)    Time: 2952-8413 PT Time Calculation (min) (ACUTE ONLY): 20 min   Charges:   PT Evaluation $PT Eval Low Complexity: 1 Low   PT General Charges $$ ACUTE PT VISIT: 1 Visit         11:15 AM, 07/23/23  Sylvestre Rathgeber Small Norma Montemurro MPT [facility] [place] [phone]

## 2023-07-24 DIAGNOSIS — E876 Hypokalemia: Secondary | ICD-10-CM | POA: Diagnosis not present

## 2023-07-24 LAB — CBC
HCT: 25.6 % — ABNORMAL LOW (ref 36.0–46.0)
Hemoglobin: 8.2 g/dL — ABNORMAL LOW (ref 12.0–15.0)
MCH: 24.4 pg — ABNORMAL LOW (ref 26.0–34.0)
MCHC: 32 g/dL (ref 30.0–36.0)
MCV: 76.2 fL — ABNORMAL LOW (ref 80.0–100.0)
Platelets: 657 10*3/uL — ABNORMAL HIGH (ref 150–400)
RBC: 3.36 MIL/uL — ABNORMAL LOW (ref 3.87–5.11)
RDW: 18.2 % — ABNORMAL HIGH (ref 11.5–15.5)
WBC: 14.7 10*3/uL — ABNORMAL HIGH (ref 4.0–10.5)
nRBC: 0 % (ref 0.0–0.2)

## 2023-07-24 LAB — BASIC METABOLIC PANEL
Anion gap: 10 (ref 5–15)
BUN: 6 mg/dL — ABNORMAL LOW (ref 8–23)
CO2: 22 mmol/L (ref 22–32)
Calcium: 9.7 mg/dL (ref 8.9–10.3)
Chloride: 100 mmol/L (ref 98–111)
Creatinine, Ser: 0.81 mg/dL (ref 0.44–1.00)
GFR, Estimated: >60 mL/min (ref >60)
Glucose, Bld: 116 mg/dL — ABNORMAL HIGH (ref 70–99)
Potassium: 2.8 mmol/L — ABNORMAL LOW (ref 3.5–5.1)
Sodium: 132 mmol/L — ABNORMAL LOW (ref 135–145)

## 2023-07-24 MED ORDER — POTASSIUM CHLORIDE 10 MEQ/100ML IV SOLN
10.0000 meq | INTRAVENOUS | Status: DC
  Administered 2023-07-24 (×3): 10 meq via INTRAVENOUS
  Filled 2023-07-24 (×4): qty 100

## 2023-07-24 MED ORDER — SENNOSIDES-DOCUSATE SODIUM 8.6-50 MG PO TABS
1.0000 | ORAL_TABLET | Freq: Every day | ORAL | Status: DC
  Administered 2023-07-24 – 2023-07-29 (×6): 1 via ORAL
  Filled 2023-07-24 (×6): qty 1

## 2023-07-24 NOTE — Progress Notes (Signed)
Physical Therapy Treatment Patient Details Name: Kimberly Adkins MRN: [id number] DOB: [birth year] Today's Date: 07/24/2023   History of Present Illness Kimberly Adkins is a 67 y.o. female with medical history significant for depression/anxiety, prior alcohol use, tobacco abuse, COPD, GERD, dyslipidemia, hypertension, osteoarthritis, and "ovary problem" who states that she has had generalized weakness over the last several days which has led to over 8 falls.  She denies any new pain or injury.  She states she has not been taking her home potassium and does not state a reason why.  She has had an episode of nausea and vomiting as well as diarrhea.  She was also noted to have some mild confusion and states that she usually has some mild abdominal distention.    PT Comments  Therapist arrived with pt laying horizontal across bed with legs and head hanging off either side.  Min A and cueing for mechanics to assist with bed mobility.  Pt declined further PT today.  Pt educated on benefits of completing to assist with strengthening and reduce risk of bed sores and further weakening.  Multiple attempts complete though continues to decline.  Pt left in bed with call bell within reach and bed alarm set.    If plan is discharge home, recommend the following:     Can travel by private vehicle        Equipment Recommendations       Recommendations for Other Services       Precautions / Restrictions Precautions Precautions: Fall Restrictions Weight Bearing Restrictions: No     Mobility  Bed Mobility Overal bed mobility: Needs Assistance Bed Mobility: Rolling Rolling: Supervision, Min assist         General bed mobility comments: Therapist arrived with pt laying horizontal with legs and head laying off both sides of bed, bed mobility with slow labored movements and cueing for hand placement to straigthen up in bed.  Pt declined further therapy.    Transfers                         Ambulation/Gait                   Stairs             Wheelchair Mobility     Tilt Bed    Modified Rankin (Stroke Patients Only)       Balance                                            Cognition Arousal: Lethargic Behavior During Therapy:  (A&O x3 though significant delay prior response.  Pt unsure of date or time of the day) Overall Cognitive Status: No family/caregiver present to determine baseline cognitive functioning                                          Exercises      General Comments        Pertinent Vitals/Pain Pain Assessment Pain Assessment: No/denies pain    Home Living                          Prior Function  PT Goals (current goals can now be found in the care plan section)      Frequency           PT Plan      Co-evaluation              AM-PAC PT "6 Clicks" Mobility   Outcome Measure  Help needed turning from your back to your side while in a flat bed without using bedrails?: A Lot Help needed moving from lying on your back to sitting on the side of a flat bed without using bedrails?: A Lot Help needed moving to and from a bed to a chair (including a wheelchair)?: A Lot Help needed standing up from a chair using your arms (e.g., wheelchair or bedside chair)?: A Lot Help needed to walk in hospital room?: A Lot Help needed climbing 3-5 steps with a railing? : Total 6 Click Score: 11    End of Session   Activity Tolerance: Patient tolerated treatment well Patient left: in bed;with call bell/phone within reach;with bed alarm set Nurse Communication: Mobility status PT Visit Diagnosis: Unsteadiness on feet (R26.81);Repeated falls (R29.6);Muscle weakness (generalized) (M62.81);History of falling (Z91.81)     Time: 7846-9629 PT Time Calculation (min) (ACUTE ONLY): 13 min  Charges:    $Therapeutic Activity: 8-22 mins PT General Charges $$ ACUTE  PT VISIT: 1 Visit                     Becky Sax, LPTA/CLT; CBIS [phone] Juel Burrow 07/24/2023, 2:15 PM

## 2023-07-24 NOTE — TOC Progression Note (Addendum)
Transition of Care [facility]) - Progression Note    Patient Details  Name: Kimberly Adkins MRN: [id number] Date of Birth: [birth year]  [facility] of [facility]) CM/SW Contact  Leitha Bleak, RN Phone Number: 07/24/2023, 1:36 PM  Clinical Narrative:   CM at the bedside to discuss bed offers with patient. She has just been moved to 300, pulling at her IV asking CM to remove it. She does not answer question appropriately. CM has left multiple message last two days for her sister. Team updated, MD will try to [name].   TOC starting INS AUTH and will add bed when selected.   AddendumWynonia Sours # [phone] [id number]  Expected Discharge Plan: Skilled Nursing Facility Barriers to Discharge: Continued Medical Work up  Expected Discharge Plan and Services    Post Acute Care Choice: Skilled Nursing Facility Living arrangements for the past 2 months: Single Family Home        Social Determinants of Health (SDOH) Interventions SDOH Screenings   Food Insecurity: No Food Insecurity (07/22/2023)  Housing: Medium Risk (07/22/2023)  Transportation Needs: No Transportation Needs (07/22/2023)  Utilities: Not At Risk (07/22/2023)  Recent Concern: Utilities - At Risk (06/03/2023)  Alcohol Screen: Low Risk  (12/07/2022)  Depression (PHQ2-9): High Risk (06/10/2023)  Tobacco Use: High Risk (07/21/2023)    Readmission Risk Interventions    07/23/2023    2:29 PM  Readmission Risk Prevention Plan  Post Dischage Appt Not Complete  Medication Screening Complete  Transportation Screening Complete

## 2023-07-24 NOTE — Consult Note (Signed)
Follow Up Gyn Consult  I haves discussed this case with Dr Pricilla Holm, Gyn Oncology and she feels this patient would be best cared for at [facility] Dept of Gyn Oncology given her presentation and disease/tumor load Therapy will be palliative in nature and will need coordination with her other medical issues Dr Pricilla Holm has contacted the scheduling coordinator with this patient's information and she will be contacted for a work in appt next week  DR Flossie Dibble and I discussed this care plan.  Disposition may include discharge tomorrow, I see notes regarding SNF as well I will make sure she has a follow up appt to manage this challenging gyn problems  Lazaro Arms, MD 07/24/2023 12:14 PM

## 2023-07-24 NOTE — Progress Notes (Signed)
PROGRESS NOTE    Patient: Kimberly Adkins                            PCP: [name], NP                    DOB: 1956/07/23            DOA: 07/21/2023 [id number]             DOS: 07/24/2023, 10:26 AM   LOS: 3 days   Date of Service: The patient was seen and examined on 07/24/2023  Subjective:   The patient was seen and examined this morning, stable no acute distress, reporting improved nausea vomiting, able to tolerate some liquids now Denies any chest pain, shortness of breath No signs of overt alcohol withdrawal  Brief Narrative:   Kimberly Adkins is a 66 y.o. female with medical history significant for depression/anxiety, prior alcohol use, tobacco abuse, COPD, GERD, dyslipidemia, hypertension, osteoarthritis, and "ovary problem" who states that she has had generalized weakness over the last several days which has led to over 8 falls.  She denies any new pain or injury.  She states she has not been taking her home potassium and does not state a reason why.  She has had an episode of nausea and vomiting as well as diarrhea.  She was also noted to have some mild confusion and states that she usually has some mild abdominal distention.   ED Course: Vital signs stable and patient afebrile.  Potassium noted to be 2.4 and she has been started on repletion.  She has also been started on some IV fluid in the ED.  Hemoglobin 9.5.       Assessment & Plan:   Principal Problem:   Hypokalemia Active Problems:   GERD   Osteoarthritis   COPD (chronic obstructive pulmonary disease) (HCC)   Hypertension   Dyslipidemia   Benzodiazepine dependence (HCC)   Alcohol use disorder   MDD (major depressive disorder), recurrent episode, mild (HCC)   Generalized weakness secondary to severe hypokalemia With persistent nausea vomiting-and episodic diarrhea Improved nausea vomiting and diarrhea Persistent hypokalemia -Potassium level 2.4, 2.8, 2.8,  2.8 again today -Replating with more  IV potassium 40 mill equivalent, and will encourage p.o. potassium  -Apparently has been noncompliant with home potassium supplementation -S/P IV fluid hydration -Antiemetics  -Will consult PT OT for evaluation recommendations   Abdominal distention secondary to concern for cervical neoplasm with metastatic disease to lungs, liver, and peritoneum -Appreciate gynecology evaluation and recommendations: CT scan has been reviewed segment mass concerning cervical cancer with evidence of extrapelvic disease and enlarged aortic/lymph nodes with ascites and pleural effusion -Dr. Despina Hidden following and in discussion with Dr. Alvester Morin Dr. Val Eagle further recommendations   Anemia -anemia of chronic disease -No overt active bleeding, patient did report to GYN of some episodic vaginal spotting and bleeding -Monitoring H&H closely    Latest Ref Rng & Units 07/24/2023    5:04 AM 07/23/2023    4:59 AM 07/21/2023    7:24 PM  CBC  WBC 4.0 - 10.5 K/uL 14.7  14.3    Hemoglobin 12.0 - 15.0 g/dL 8.2  8.4  9.5   Hematocrit 36.0 - 46.0 % 25.6  27.2  28.0   Platelets 150 - 400 K/uL 657  688        Depression/anxiety Stable -Some withdrawal, no signs of anxiety -Continue Xanax and  Prozac   COPD -Stable -DuoNebs as needed -Continue Dulera   Dyslipidemia -Continue atorvastatin   Hypertension -Mildly hypertensive otherwise stable -Continue amlodipine   Tobacco abuse -Smokes 1 pack/day, counseled on cessation    ?  History of alcohol abuse No signs of withdrawal -Monitoring closely   DVT prophylaxis: Lovenox Code Status: Full Family Communication: None at bedside Disposition Plan: Admit for treatment of hypokalemia and further evaluation of gynecologic cancer with metastatic disease Consults called: Gynecology Dr. Despina Hidden    ----------------------------------------------------------------------------------------------------------------------------------------------- Nutritional status:   The patient's BMI is: Body mass index is 27.51 kg/m. I agree with the assessment and plan as outlined ------------------------------------------------------------------------------------------------------------------------------------------------  DVT prophylaxis:     Code Status:   Code Status: Full Code  Family Communication: No family member present at bedside- attempt will be made to update daily -Advance care planning has been discussed.   Admission status:   Status is: Inpatient Remains inpatient appropriate because: Needing continuous IV fluids, IV antiemetics, GYN evaluation for new diagnosis of cancer with metastases   Disposition: From  - home             Planning for discharge in 2-4 days -   Procedures:   No admission procedures for hospital encounter.   Antimicrobials:  Anti-infectives (From admission, onward)    None        Medication:   ALPRAZolam  1 mg Oral TID   amLODipine  5 mg Oral Daily   Chlorhexidine Gluconate Cloth  6 each Topical Daily   FLUoxetine  60 mg Oral Daily   folic acid  1 mg Oral Daily   mometasone-formoterol  2 puff Inhalation BID   mupirocin ointment  1 Application Nasal BID   nicotine  21 mg Transdermal Daily   ondansetron (ZOFRAN) IV  4 mg Intravenous Q6H   thiamine  100 mg Oral Daily    acetaminophen **OR** acetaminophen, LORazepam   Objective:   Vitals:   07/24/23 0500 07/24/23 0600 07/24/23 0727 07/24/23 0858  BP: 139/84 (!) 155/97    Pulse:      Resp: 12 14    Temp: 98.2 F (36.8 C)  97.7 F (36.5 C)   TempSrc: Oral  Oral   SpO2:    96%  Weight:      Height:        Intake/Output Summary (Last 24 hours) at 07/24/2023 1026 Last data filed at 07/24/2023 0205 Gross per 24 hour  Intake 973.91 ml  Output 650 ml  Net 323.91 ml   Filed Weights   07/21/23 1906 07/23/23 0400  Weight: 80.7 kg 72.7 kg     Physical examination:     General:  AAO x 3,  cooperative, no distress;   HEENT:  Normocephalic,  PERRL, otherwise with in Normal limits   Neuro:  CNII-XII intact. , normal motor and sensation, reflexes intact   Lungs:   Clear to auscultation BL, Respirations unlabored,  No wheezes / crackles  Cardio:    S1/S2, RRR, No murmure, No Rubs or Gallops   Abdomen:  Soft, non-tender, bowel sounds active all four quadrants, no guarding or peritoneal signs.  Muscular  skeletal:  Limited exam -global generalized weaknesses - in bed, able to move all 4 extremities,   2+ pulses,  symmetric, No pitting edema  Skin:  Dry, warm to touch, negative for any Rashes,  Wounds: Please see nursing documentation          ------------------------------------------------------------------------------------------------------------------------------------------    LABs:     Latest  Ref Rng & Units 07/24/2023    5:04 AM 07/23/2023    4:59 AM 07/21/2023    7:24 PM  CBC  WBC 4.0 - 10.5 K/uL 14.7  14.3    Hemoglobin 12.0 - 15.0 g/dL 8.2  8.4  9.5   Hematocrit 36.0 - 46.0 % 25.6  27.2  28.0   Platelets 150 - 400 K/uL 657  688        Latest Ref Rng & Units 07/24/2023    5:04 AM 07/23/2023    4:59 AM 07/22/2023   11:08 AM  CMP  Glucose 70 - 99 mg/dL 132  440    BUN 8 - 23 mg/dL 6  6    Creatinine 1.02 - 1.00 mg/dL 7.25  3.66    Sodium 440 - 145 mmol/L 132  133    Potassium 3.5 - 5.1 mmol/L 2.8  2.8  2.8   Chloride 98 - 111 mmol/L 100  97    CO2 22 - 32 mmol/L 22  24    Calcium 8.9 - 10.3 mg/dL 9.7  34.7    Total Protein 6.5 - 8.1 g/dL  6.5    Total Bilirubin 0.3 - 1.2 mg/dL  0.8    Alkaline Phos 38 - 126 U/L  89    AST 15 - 41 U/L  25    ALT 0 - 44 U/L  14         Micro Results Recent Results (from the past 240 hour(s))  MRSA Next Gen by PCR, Nasal     Status: Abnormal   Collection Time: 07/22/23  8:25 PM   Specimen: Nasal Mucosa; Nasal Swab  Result Value Ref Range Status   MRSA by PCR Next Gen DETECTED (A) NOT DETECTED Final    Comment: RESULT CALLED TO, READ BACK BY AND VERIFIED  WITH: Shaune Leeks [phone], VIRAY,J (NOTE) The GeneXpert MRSA Assay (FDA approved for NASAL specimens only), is one component of a comprehensive MRSA colonization surveillance program. It is not intended to diagnose MRSA infection nor to guide or monitor treatment for MRSA infections. Test performance is not FDA approved in patients less than 17 years old. Performed at [facility], [address] [place], [place] [address]     Radiology Reports No results found.  SIGNED: Kendell Bane, MD, FHM. FAAFP. [facility] - [facility] Critical care time spent - 55 min.  In seeing, evaluating and examining the patient. Reviewing medical records, labs, drawn plan of care. [facility],  Pager (please use [email or web address] to page/ text) Please use Epic Secure Chat for non-urgent communication (7AM-7PM)  If 7PM-7AM, please contact night-coverage [email or web address], 07/24/2023, 10:26 AM

## 2023-07-25 ENCOUNTER — Inpatient Hospital Stay (HOSPITAL_COMMUNITY): Payer: Medicare PPO

## 2023-07-25 DIAGNOSIS — E876 Hypokalemia: Secondary | ICD-10-CM | POA: Diagnosis not present

## 2023-07-25 LAB — CA 125: Cancer Antigen (CA) 125: 522 U/mL — ABNORMAL HIGH (ref 0.0–38.1)

## 2023-07-25 LAB — URINALYSIS, ROUTINE W REFLEX MICROSCOPIC
Bilirubin Urine: NEGATIVE
Glucose, UA: NEGATIVE mg/dL
Ketones, ur: 5 mg/dL — AB
Leukocytes,Ua: NEGATIVE
Nitrite: NEGATIVE
Protein, ur: 100 mg/dL — AB
Specific Gravity, Urine: 1.014 (ref 1.005–1.030)
pH: 6 (ref 5.0–8.0)

## 2023-07-25 LAB — CBC
HCT: 27 % — ABNORMAL LOW (ref 36.0–46.0)
Hemoglobin: 8.4 g/dL — ABNORMAL LOW (ref 12.0–15.0)
MCH: 24 pg — ABNORMAL LOW (ref 26.0–34.0)
MCHC: 31.1 g/dL (ref 30.0–36.0)
MCV: 77.1 fL — ABNORMAL LOW (ref 80.0–100.0)
Platelets: 701 10*3/uL — ABNORMAL HIGH (ref 150–400)
RBC: 3.5 MIL/uL — ABNORMAL LOW (ref 3.87–5.11)
RDW: 18.6 % — ABNORMAL HIGH (ref 11.5–15.5)
WBC: 16.4 10*3/uL — ABNORMAL HIGH (ref 4.0–10.5)
nRBC: 0 % (ref 0.0–0.2)

## 2023-07-25 LAB — BASIC METABOLIC PANEL
Anion gap: 8 (ref 5–15)
BUN: 7 mg/dL — ABNORMAL LOW (ref 8–23)
CO2: 25 mmol/L (ref 22–32)
Calcium: 10 mg/dL (ref 8.9–10.3)
Chloride: 100 mmol/L (ref 98–111)
Creatinine, Ser: 0.86 mg/dL (ref 0.44–1.00)
GFR, Estimated: >60 mL/min (ref >60)
Glucose, Bld: 113 mg/dL — ABNORMAL HIGH (ref 70–99)
Potassium: 3.1 mmol/L — ABNORMAL LOW (ref 3.5–5.1)
Sodium: 133 mmol/L — ABNORMAL LOW (ref 135–145)

## 2023-07-25 MED ORDER — POTASSIUM CHLORIDE CRYS ER 20 MEQ PO TBCR
40.0000 meq | EXTENDED_RELEASE_TABLET | Freq: Once | ORAL | Status: AC
  Administered 2023-07-25: 40 meq via ORAL
  Filled 2023-07-25: qty 2

## 2023-07-25 MED ORDER — AMLODIPINE BESYLATE 5 MG PO TABS
10.0000 mg | ORAL_TABLET | Freq: Every day | ORAL | Status: DC
  Administered 2023-07-26 – 2023-07-29 (×4): 10 mg via ORAL
  Filled 2023-07-25 (×4): qty 2

## 2023-07-25 NOTE — Progress Notes (Signed)
Patient slept most of the night during this shift. No complaints of pain or discomfort. Plan of care ongoing.

## 2023-07-25 NOTE — Progress Notes (Signed)
PROGRESS NOTE    Patient: Kimberly Adkins                            PCP: [name], NP                    DOB: [birth year]            DOA: 07/21/2023 ZOX:[id number]             DOS: 07/25/2023, 12:37 PM   LOS: 4 days   Date of Service: The patient was seen and examined on 07/25/2023  Subjective:   The patient was seen and examined this morning, more responsive, Per nursing and adequate p.o. intake Dynamically stable  Brief Narrative:   Kimberly Adkins is a 67 y.o. female with medical history significant for depression/anxiety, prior alcohol use, tobacco abuse, COPD, GERD, dyslipidemia, hypertension, osteoarthritis, and "ovary problem" who states that she has had generalized weakness over the last several days which has led to over 8 falls.  She denies any new pain or injury.  She states she has not been taking her home potassium and does not state a reason why.  She has had an episode of nausea and vomiting as well as diarrhea.  She was also noted to have some mild confusion and states that she usually has some mild abdominal distention.   ED Course: Vital signs stable and patient afebrile.  Potassium noted to be 2.4 and she has been started on repletion.  She has also been started on some IV fluid in the ED.  Hemoglobin 9.5.    Assessment & Plan:   Principal Problem:   Hypokalemia Active Problems:   GERD   Osteoarthritis   COPD (chronic obstructive pulmonary disease) (HCC)   Hypertension   Dyslipidemia   Benzodiazepine dependence (HCC)   Alcohol use disorder   MDD (major depressive disorder), recurrent episode, mild (HCC)   Generalized weakness secondary to severe hypokalemia With persistent nausea vomiting-and episodic diarrhea Resolved nausea/vomiting/diarrhea -Poor appetite, poor p.o. intake -Status post PT OT evaluation, recommending SNF    Persistent hypokalemia -Potassium level 2.4, 2.8, 3.1 today Has been repleted with IV potassium and magnesium, today  with p.o. 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 Despina Hidden following and in discussion with Dr. Alvester Morin and  Dr. Tucker-recommended follow-up as an outpatient for tissue biopsy and treatment initiation   Anemia -anemia of chronic disease -No overt active bleeding, patient did report to GYN of some episodic vaginal spotting and bleeding -Monitoring H&H closely    Latest Ref Rng & Units 07/25/2023    4:49 AM 07/24/2023    5:04 AM 07/23/2023    4:59 AM  CBC  WBC 4.0 - 10.5 K/uL 16.4  14.7  14.3   Hemoglobin 12.0 - 15.0 g/dL 8.4  8.2  8.4   Hematocrit 36.0 - 46.0 % 27.0  25.6  27.2   Platelets 150 -  400 K/uL 701  657  688    Leukocytosis -Afebrile, no other signs of infection Obtaining UA   Depression/anxiety Remains stable -Some withdrawal, no signs of anxiety -Continue Xanax and Prozac   COPD -Remains stable -DuoNebs as needed -Continue Dulera   Dyslipidemia -Continue atorvastatin   Hypertension -Mildly hypertensive otherwise stable -Treating amlodipine   Tobacco abuse -Smokes 1 pack/day, counseled on cessation    ?  History of alcohol abuse No signs of withdrawal -Monitoring closely   DVT prophylaxis: Lovenox Code Status: Full Family Communication: None at bedside Disposition Plan: Admit for treatment of hypokalemia and further  evaluation of gynecologic cancer with metastatic disease Consults called: Gynecology Dr. Despina Hidden    ----------------------------------------------------------------------------------------------------------------------------------------------- Nutritional status:  The patient's BMI is: Body mass index is 27.51 kg/m. I agree with the assessment and plan as outlined -------------------------------------------------------------------------------------------------------------------------  Family Communication: Tried calling sister, number on the chart with no answer -Advance care planning has been discussed.   Admission status:   Status is: Inpatient Remains inpatient appropriate because: Needing continuous IV fluids, IV antiemetics, GYN evaluation for new diagnosis of cancer with metastases   Disposition: From  - home             Planning for discharge in 2-4 days - ? SNF  Procedures:   No admission procedures for hospital encounter.   Antimicrobials:  Anti-infectives (From admission, onward)    None        Medication:   ALPRAZolam  1 mg Oral TID   amLODipine  5 mg Oral Daily   Chlorhexidine Gluconate Cloth  6 each Topical Daily   FLUoxetine  60 mg Oral Daily   folic acid  1 mg Oral Daily   mometasone-formoterol  2 puff Inhalation BID   mupirocin ointment  1 Application Nasal BID   nicotine  21 mg Transdermal Daily   ondansetron (ZOFRAN) IV  4 mg Intravenous Q6H   senna-docusate  1 tablet Oral QHS   thiamine  100 mg Oral Daily    acetaminophen **OR** acetaminophen, LORazepam   Objective:   Vitals:   07/24/23 2013 07/24/23 2128 07/25/23 0510 07/25/23 1226  BP:  136/73 133/88 (!) 160/73  Pulse:  97 96 96  Resp:  20 18 16   Temp:  98.6 F (37 C) 97.8 F (36.6 C) 98.6 F (37 C)  TempSrc:   Oral Oral  SpO2: 92% 94% 95% 95%  Weight:      Height:        Intake/Output Summary (Last 24 hours) at 07/25/2023 1237 Last data filed at 07/25/2023 6578 Gross per 24  hour  Intake 240 ml  Output --  Net 240 ml   Filed Weights   07/21/23 1906 07/23/23 0400  Weight: 80.7 kg 72.7 kg     Physical examination:   General:  AAO x 3,  cooperative, no distress;   HEENT:  Normocephalic, PERRL, otherwise with in Normal limits   Neuro:  CNII-XII intact. , normal motor and sensation, reflexes intact   Lungs:   Clear to auscultation BL, Respirations unlabored,  No wheezes / crackles  Cardio:    S1/S2, RRR, No murmure, No Rubs or Gallops   Abdomen:  Soft, non-tender, bowel sounds active all four quadrants, no guarding or peritoneal signs.  Muscular  skeletal:  Limited exam -global generalized weaknesses - in bed, able to move all 4 extremities,   2+ pulses,  symmetric, No pitting edema  Skin:  Dry, warm to touch, negative for any Rashes,  Wounds: Please see nursing documentation    -----------------------------------------------------------------------------------------------------------------------    LABs:     Latest Ref Rng & Units 07/25/2023    4:49 AM 07/24/2023    5:04 AM 07/23/2023    4:59 AM  CBC  WBC 4.0 - 10.5 K/uL 16.4  14.7  14.3   Hemoglobin 12.0 - 15.0 g/dL 8.4  8.2  8.4   Hematocrit 36.0 - 46.0 % 27.0  25.6  27.2   Platelets 150 - 400 K/uL 701  657  688       Latest Ref Rng & Units 07/25/2023    4:49 AM 07/24/2023    5:04 AM 07/23/2023    4:59 AM  CMP  Glucose 70 - 99 mg/dL 829  562  130   BUN 8 - 23 mg/dL 7  6  6    Creatinine 0.44 - 1.00 mg/dL 8.65  7.84  6.96   Sodium 135 - 145 mmol/L 133  132  133   Potassium 3.5 - 5.1 mmol/L 3.1  2.8  2.8   Chloride 98 - 111 mmol/L 100  100  97   CO2 22 - 32 mmol/L 25  22  24    Calcium 8.9 - 10.3 mg/dL 29.5  9.7  28.4   Total Protein 6.5 - 8.1 g/dL   6.5   Total Bilirubin 0.3 - 1.2 mg/dL   0.8   Alkaline Phos 38 - 126 U/L   89   AST 15 - 41 U/L   25   ALT 0 - 44 U/L   14        Micro Results Recent Results (from the past 240 hour(s))  MRSA Next Gen by PCR, Nasal     Status:  Abnormal   Collection Time: 07/22/23  8:25 PM   Specimen: Nasal Mucosa; Nasal Swab  Result Value Ref Range Status   MRSA by PCR Next Gen DETECTED (A) NOT DETECTED Final    Comment: RESULT CALLED TO, READ BACK BY AND VERIFIED WITH: Shaune Leeks [phone], VIRAY,J (NOTE) The GeneXpert MRSA Assay (FDA approved for NASAL specimens only), is one component of a comprehensive MRSA colonization surveillance program. It is not intended to diagnose MRSA infection nor to guide or monitor treatment for MRSA infections. Test performance is not FDA approved in patients less than 89 years old. Performed at [facility], [address], [place], [place] [address]     Radiology Reports No results found.  SIGNED: Kendell Bane, MD, FHM. FAAFP. [facility] - [facility] time spent - 55 min.  In seeing, evaluating and examining the patient. Reviewing medical records, labs, drawn plan of care. [facility],  Pager (please use [email or web address] to page/ text) Please use Epic Secure Chat for non-urgent communication (7AM-7PM)  If 7PM-7AM, please contact night-coverage [email or web address], 07/25/2023, 12:37 PM

## 2023-07-25 NOTE — Progress Notes (Signed)
Pt has been alert and oriented to self this shift. Pt is forgetful and disoriented to place and situation. PT has ate about 40 % of breakfast and refused lunch. Pt has had poor oral intake throughout shift MD notified this AM. Nursing staff has made several attempts to encourage fluids and pt refuses.

## 2023-07-25 NOTE — TOC Progression Note (Signed)
Transition of Care [facility]) - Progression Note    Patient Details  Name: ZYONA PETTAWAY MRN: [id number] Date of Birth: 1956/07/11  Transition of Care [facility]) CM/SW Contact  Elliot Gault, LCSW Phone Number: 07/25/2023, 2:27 PM  Clinical Narrative:     TOC following. Attempted to meet with pt to present bed offers. Pt does not appear to be fully oriented and she was unable to participate in discussion. Attempted to call pt's sister though she did not answer. HIPAA compliant VMM left requesting return call.  Pt will need auth completed once SNF bed selected. TOC will follow.  Expected Discharge Plan: Skilled Nursing Facility Barriers to Discharge: Continued Medical Work up  Expected Discharge Plan and Services     Post Acute Care Choice: Skilled Nursing Facility Living arrangements for the past 2 months: Single Family Home                                       Social Determinants of Health (SDOH) Interventions SDOH Screenings   Food Insecurity: No Food Insecurity (07/22/2023)  Housing: Medium Risk (07/22/2023)  Transportation Needs: No Transportation Needs (07/22/2023)  Utilities: Not At Risk (07/22/2023)  Recent Concern: Utilities - At Risk (06/03/2023)  Alcohol Screen: Low Risk  (12/07/2022)  Depression (PHQ2-9): High Risk (06/10/2023)  Tobacco Use: High Risk (07/21/2023)    Readmission Risk Interventions    07/23/2023    2:29 PM  Readmission Risk Prevention Plan  Post Dischage Appt Not Complete  Medication Screening Complete  Transportation Screening Complete

## 2023-07-25 NOTE — Progress Notes (Signed)
Family is wanting information regarding POA and other options for rehab. Orders have been placed. Darel Hong williams wants to be contacted for any changes her phone number is in the chart.

## 2023-07-25 NOTE — Care Management Important Message (Signed)
Important Message  Patient Details  Name: Kimberly Adkins MRN: [id number] Date of Birth: 07/05/1956   Important Message Given:  Yes - Medicare IM (late entry)     Corey Harold 07/25/2023, 5:28 PM

## 2023-07-26 DIAGNOSIS — E876 Hypokalemia: Secondary | ICD-10-CM | POA: Diagnosis not present

## 2023-07-26 LAB — MAGNESIUM: Magnesium: 2.2 mg/dL (ref 1.7–2.4)

## 2023-07-26 LAB — CBC
HCT: 26.4 % — ABNORMAL LOW (ref 36.0–46.0)
Hemoglobin: 8 g/dL — ABNORMAL LOW (ref 12.0–15.0)
MCH: 23.7 pg — ABNORMAL LOW (ref 26.0–34.0)
MCHC: 30.3 g/dL (ref 30.0–36.0)
MCV: 78.3 fL — ABNORMAL LOW (ref 80.0–100.0)
Platelets: 624 10*3/uL — ABNORMAL HIGH (ref 150–400)
RBC: 3.37 MIL/uL — ABNORMAL LOW (ref 3.87–5.11)
RDW: 18.4 % — ABNORMAL HIGH (ref 11.5–15.5)
WBC: 16 10*3/uL — ABNORMAL HIGH (ref 4.0–10.5)
nRBC: 0 % (ref 0.0–0.2)

## 2023-07-26 LAB — BASIC METABOLIC PANEL
Anion gap: 8 (ref 5–15)
BUN: 10 mg/dL (ref 8–23)
CO2: 26 mmol/L (ref 22–32)
Calcium: 10.6 mg/dL — ABNORMAL HIGH (ref 8.9–10.3)
Chloride: 102 mmol/L (ref 98–111)
Creatinine, Ser: 0.97 mg/dL (ref 0.44–1.00)
GFR, Estimated: >60 mL/min (ref >60)
Glucose, Bld: 111 mg/dL — ABNORMAL HIGH (ref 70–99)
Potassium: 2.9 mmol/L — ABNORMAL LOW (ref 3.5–5.1)
Sodium: 136 mmol/L (ref 135–145)

## 2023-07-26 LAB — PROCALCITONIN: Procalcitonin: 0.72 ng/mL

## 2023-07-26 LAB — LACTIC ACID, PLASMA: Lactic Acid, Venous: 1.5 mmol/L (ref 0.5–1.9)

## 2023-07-26 MED ORDER — SODIUM CHLORIDE 0.9 % IV SOLN
1.0000 g | INTRAVENOUS | Status: DC
  Administered 2023-07-26: 1 g via INTRAVENOUS
  Filled 2023-07-26: qty 10

## 2023-07-26 MED ORDER — POTASSIUM CHLORIDE CRYS ER 20 MEQ PO TBCR
40.0000 meq | EXTENDED_RELEASE_TABLET | Freq: Once | ORAL | Status: AC
  Administered 2023-07-26: 40 meq via ORAL
  Filled 2023-07-26: qty 2

## 2023-07-26 MED ORDER — LORAZEPAM 2 MG/ML IJ SOLN
1.0000 mg | INTRAMUSCULAR | Status: DC
  Administered 2023-07-26: 2 mg via INTRAVENOUS
  Administered 2023-07-26 (×2): 1 mg via INTRAVENOUS
  Administered 2023-07-27: 2 mg via INTRAVENOUS
  Administered 2023-07-27: 1 mg via INTRAVENOUS
  Administered 2023-07-27: 2 mg via INTRAVENOUS
  Filled 2023-07-26 (×6): qty 1

## 2023-07-26 MED ORDER — CIPROFLOXACIN HCL 250 MG PO TABS: 500.0000 mg | ORAL_TABLET | Freq: Two times a day (BID) | ORAL | Status: DC

## 2023-07-26 MED ORDER — MAGNESIUM SULFATE 2 GM/50ML IV SOLN
2.0000 g | Freq: Once | INTRAVENOUS | Status: AC
  Administered 2023-07-26: 2 g via INTRAVENOUS
  Filled 2023-07-26: qty 50

## 2023-07-26 NOTE — TOC Progression Note (Addendum)
Transition of Care [facility]) - Progression Note    Patient Details  Name: Kimberly Adkins MRN: [id number] Date of Birth: 1956-05-07  Transition of Care [facility]) CM/SW Contact  Catalina Gravel, LCSW Phone Number: 07/26/2023, 11:09 AM  Clinical Narrative:     CSW spoke with sister Darel Hong who stated that pt/ family not interested in either bed offer.  Instead family prefers [facility] inpatient rehab as first choice or a different SNF in [facility].  Sister requested [facility] be requested. PCP is in [place].  TOC to follow.   Addendum:  Sister called back and asked about another sibling hearing about  a palliative  care consult. CSW stated could be for education, she can speak to DR. CSW shared [facility] SNF bed has bed requested. Sister further clarified pt typically rents a room from their niece- so after rehab to improve her walking, the plan is to return to nieces home. TOC to follow.   Expected Discharge Plan: Skilled Nursing Facility Barriers to Discharge: Continued Medical Work up  Expected Discharge Plan and Services     Post Acute Care Choice: Skilled Nursing Facility Living arrangements for the past 2 months: Single Family Home                                       Social Determinants of Health (SDOH) Interventions SDOH Screenings   Food Insecurity: No Food Insecurity (07/22/2023)  Housing: Medium Risk (07/22/2023)  Transportation Needs: No Transportation Needs (07/22/2023)  Utilities: Not At Risk (07/22/2023)  Recent Concern: Utilities - At Risk (06/03/2023)  Alcohol Screen: Low Risk  (12/07/2022)  Depression (PHQ2-9): High Risk (06/10/2023)  Tobacco Use: High Risk (07/21/2023)    Readmission Risk Interventions    07/23/2023    2:29 PM  Readmission Risk Prevention Plan  Post Dischage Appt Not Complete  Medication Screening Complete  Transportation Screening Complete

## 2023-07-26 NOTE — Progress Notes (Signed)
Inpatient Rehab Admissions Coordinator:   CIR consult received. At this time, Pt. does not appear to demonstrate medical necessity to justify in hospital rehabilitation/CIR. Once her potassium levels stabilize, I do not think she will ned the supervision of a physiatrist during her rehab course. Additionally, Pt. Has not transferred out of bed during her admission and is not demonstrating ability to tolerate 3 hours of daily therapy.    Recommend other rehab venues to be pursued.  Please contact me with any questions.  Megan Salon, MS, CCC-SLP Rehab Admissions Coordinator  [phone] (celll) [phone] (office)

## 2023-07-26 NOTE — Consult Note (Signed)
Follow up note: Family discussion review note  Chief Complaint  Patient presents with   Weakness    Blood pressure (!) 140/76, pulse 93, temperature 98 F (36.7 C), temperature source Oral, resp. rate 16, height 5\' 4"  (1.626 m), weight 72.7 kg, SpO2 91%.  Dr. Flossie Dibble sent me a secure chat earlier this afternoon regarding family concerns with triage and management of this patient  I called Dr. Flossie Dibble and we discussed the meeting with the patient's sisters previously  Dr. Flossie Dibble is aware that I discussed this complex patient with Drs. Leonette Monarch, GYN oncology, and both of their opinions were the patient would be better managed at [facility] because of the advanced nature of her disease. Drs. Pricilla Holm and [place] reviewed the patient's scans labs and we talked in 2 occasions as well as several secure chat. As a result of that the patient had an appointment with Dr. Alvester Morin Friday, October 18 at 11 AM in the Department of GYN oncology at [facility].  I offered to contact the patient's family and Dr. Flossie Dibble gave me their names and phone numbers When I got off the phone with Dr. Flossie Dibble I called Charissa Bash who I understand is chosen as the power of attorney and left a message on her phone I was also told that I could speak with her other sister Ms. Jamesetta So and so I immediately called her Ms. Whitney Post is a patient of mine from several years ago and so I had a previous relationship with her  I talked with Ms. Whitney Post for approximately 30 minutes regarding the current clinical situation with her sister and my findings on exam along with the findings of her scans and labs I also discussed the conversations that I had with Drs. Pricilla Holm and Alvester Morin regarding her advanced cervical cancer I made her aware that there was no surgical option given her likely stage IV diagnosis and that because she has continued to have medical complications we were trying to have her be under the  care of the GYN oncologist when a tissue diagnosis was made However I did share the findings on exam including the parametrial involvement and the exophytic mass extending halfway down the vagina and filling the entire fornix and probably involving the base of the bladder Ms. Logan states understanding and knows that palliative management should try to prevent overwhelming hemorrhage is probably the primary goal at this point  During the conversation Ms. Logan communicated that her sister Vanessa Kick has a conspiratorial view related to certain healthcare situations including Medicare patients in the hospital I assured her that her sister was receiving appropriate supportive management and care this was a very difficult disease and was creating a number of clinical challenges which will continue going forward She voiced understanding  In addition Mrs. Logan related that her sister Vanessa Kick would be more comfortable if I will continue contact GYN oncology colleagues at [facility] for consultation and possible transfer and management  I told her that I would call them first thing Monday morning especially since the patient is proving difficult to correct and manage given the consequences of this disease as well as her pre-existing medical issues.  I called Dr. Flossie Dibble back and told him that I would be calling [facility] first thing Monday morning and consulting with a GYN oncologist there for their input.  He agrees with this plan and the patient will stay in house until I am contact  the team after that consultation and conversation.  Lazaro Arms, MD 07/26/2023 8:48 PM   MEDS ordered this encounter: Meds ordered this encounter  Medications   lactated ringers bolus 1,000 mL   potassium chloride 10 mEq in 100 mL IVPB   DISCONTD: sodium chloride 0.9 % bolus 1,000 mL   DISCONTD: 0.9 %  sodium chloride infusion   potassium chloride SA (KLOR-CON M) CR tablet 40  mEq   potassium chloride 10 mEq in 100 mL IVPB   DISCONTD: amLODipine (NORVASC) tablet 5 mg   DISCONTD: ALPRAZolam (XANAX) tablet 1 mg   FLUoxetine (PROZAC) capsule 60 mg   nicotine (NICODERM CQ - dosed in mg/24 hours) patch 21 mg   mometasone-formoterol (DULERA) 100-5 MCG/ACT inhaler 2 puff   DISCONTD: enoxaparin (LOVENOX) injection 40 mg   OR Linked Order Group    acetaminophen (TYLENOL) tablet 650 mg    acetaminophen (TYLENOL) suppository 650 mg   potassium chloride 10 mEq in 100 mL IVPB   Chlorhexidine Gluconate Cloth 2 % PADS 6 each   mupirocin ointment (BACTROBAN) 2 % 1 Application   potassium chloride 10 mEq in 100 mL IVPB   magnesium sulfate IVPB 2 g 50 mL   thiamine (VITAMIN B1) tablet 100 mg   folic acid (FOLVITE) tablet 1 mg   DISCONTD: LORazepam (ATIVAN) injection 1-2 mg   ondansetron (ZOFRAN) injection 4 mg   DISCONTD: dextrose 5 % and 0.9 % NaCl with KCl 20 mEq/L infusion   DISCONTD: potassium chloride 10 mEq in 100 mL IVPB   senna-docusate (Senokot-S) tablet 1 tablet   potassium chloride SA (KLOR-CON M) CR tablet 40 mEq   amLODipine (NORVASC) tablet 10 mg   potassium chloride SA (KLOR-CON M) CR tablet 40 mEq   magnesium sulfate IVPB 2 g 50 mL   potassium chloride SA (KLOR-CON M) CR tablet 40 mEq   DISCONTD: ciprofloxacin (CIPRO) tablet 500 mg   cefTRIAXone (ROCEPHIN) 1 g in sodium chloride 0.9 % 100 mL IVPB    Order Specific Question:   Antibiotic Indication:    Answer:   UTI   LORazepam (ATIVAN) injection 1-2 mg    Orders for this encounter: Orders Placed This Encounter  Procedures   MRSA Next Gen by PCR, Nasal   Urine Culture (for pregnant, neutropenic or urologic patients or patients with an indwelling urinary catheter)   CT HEAD WO CONTRAST   CT CERVICAL SPINE WO CONTRAST   DG Chest Port 1 View   CT CHEST ABDOMEN PELVIS WO CONTRAST   DG CHEST PORT 1 VIEW   CBC WITH DIFFERENTIAL   Urinalysis, Routine w reflex microscopic -Urine, Clean Catch   Ammonia    Basic metabolic panel   Hepatic function panel   Magnesium   Blood gas, venous   Urine rapid drug screen (hosp performed)   Ethanol   Basic metabolic panel   Magnesium   Comprehensive metabolic panel   CBC   Potassium   Basic metabolic panel   CBC   CA 125   Urinalysis, Routine w reflex microscopic -Urine, Clean Catch   Procalcitonin   Lactic acid, plasma   Magnesium   Diet Heart Room service appropriate? Yes; Fluid consistency: Thin; Fluid restriction: 1500 mL Fluid   Initiate Carrier Fluid Protocol   Vital signs   Notify physician (specify)   Mobility Protocol: No Restrictions   Refer to Sidebar Report Refer to ICU, Med-Surg, Progressive, and Step-Down Mobility Protocol Sidebars   Do not place and if  present remove PureWick   Initiate Oral Care Protocol   Initiate Carrier Fluid Protocol   RN may order General Admission PRN Orders utilizing "General Admission PRN medications" (through manage orders) for the following patient needs: allergy symptoms (Claritin), cold sores (Carmex), cough (Robitussin DM), eye irritation (Liquifilm Tears), hemorrhoids (Tucks), indigestion (Maalox), minor skin irritation (Hydrocortisone Cream), muscle pain Romeo Apple Gay), nose irritation (saline nasal spray) and sore throat (Chloraseptic spray).   Patient has an active order for admit to inpatient/place in observation   Swab Process:   Considerations:   If MRSA PCR Screen is Positive:   Refer to Sidebar Report - CHG cloths Sidebar   Patient Education: - [facility] Daily CHG Bathing   Ambulate patient   In and Out Cath   Encourage/Reinforce Importance Po Fluids   Full code   Consult to hospitalist   Consult to obstetrics / gynecology Consult Timeframe: ROUTINE - requires response within 24 hours; Reason for Consult? NEw Extensive Cervical Ca   Consult to oncology Consult Timeframe: ROUTINE - requires response within 24 hours; Reason for Consult? 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   Consult to Transition of Care Team   Inpatient consult to spiritual care   Consult to Transition of Care Team   OT eval and treat   PT eval and treat   PT PLAN OF CARE CERT/RE-CERT   Oxygen therapy Mode or (Route): Nasal cannula; Liters Per Minute: 2; Keep 02 saturation: greater than 92 %   CBG monitoring, ED   I-stat chem 8, ED   ED EKG   EKG 12-Lead   Insert peripheral IV   Admit to Inpatient (patient's expected length of stay will be greater than 2 midnights or inpatient only procedure)   Admit to Inpatient (patient's expected length of stay will be greater than 2 midnights or inpatient only procedure)   Transfer patient   Fall precautions    Impression + Management Plan   ICD-10-CM   1. AKI (acute kidney injury) (HCC)  N17.9     2. Hypokalemia  E87.6       Follow Up: No follow-ups on file.     All questions were answered.  Past Medical History:  Diagnosis Date   Anxiety    Depression    GERD (gastroesophageal reflux disease)    Hyperlipidemia    Hypertension    Osteoarthritis    PTSD (post-traumatic stress disorder) 2010    Past Surgical History:  Procedure Laterality Date   ANTERIOR AND POSTERIOR REPAIR  2007   BREAST BIOPSY  1997   right; benign   TUBAL LIGATION  1994    OB History     Gravida  2   Para  2   Term  2   Preterm      AB      Living  2      SAB      IAB      Ectopic      Multiple      Live Births              Allergies  Allergen Reactions   Hydromorphone Itching    Social History   Socioeconomic History   Marital status: Widowed    Spouse name: Not on file   Number of children: Not on file   Years of education: Not on file   Highest education level: Not on file  Occupational History  Occupation: unemplyed  Tobacco Use   Smoking status: Every Day    Current packs/day: 1.50    Average packs/day: 1.5 packs/day for 22.0 years (33.0 ttl  pk-yrs)    Types: Cigarettes   Smokeless tobacco: Never  Vaping Use   Vaping status: Never Used  Substance and Sexual Activity   Alcohol use: Yes   Drug use: No   Sexual activity: Never    Birth control/protection: Post-menopausal  Other Topics Concern   Not on file  Social History Narrative   Not on file   Social Determinants of Health   Financial Resource Strain: Not on file  Food Insecurity: No Food Insecurity (07/22/2023)   Hunger Vital Sign    Worried About Running Out of Food in the Last Year: Never true    Ran Out of Food in the Last Year: Never true  Transportation Needs: No Transportation Needs (07/22/2023)   PRAPARE - Administrator, Civil Service (Medical): No    Lack of Transportation (Non-Medical): No  Physical Activity: Not on file  Stress: Not on file  Social Connections: Not on file    Family History  Problem Relation Age of Onset   Breast cancer Paternal Grandmother        Age 14   Colon polyps Paternal Grandmother 30   Colon polyps Maternal Grandfather 92   Cancer Mother        Breast cancer   Breast cancer Mother        Age 59   Coronary artery disease Father    Hypertension Father    Breast cancer Paternal Aunt        Age 33's   Cancer Maternal Grandmother        colon   Heart attack Daughter

## 2023-07-26 NOTE — Plan of Care (Signed)

## 2023-07-26 NOTE — Progress Notes (Signed)
PROGRESS NOTE    Patient: Kimberly Adkins                            PCP: [name], NP                    DOB: 1956/09/27            DOA: 07/21/2023 [id number]             DOS: 07/26/2023, 12:45 PM   LOS: 5 days   Date of Service: The patient was seen and examined on 07/26/2023  Subjective:   The patient was seen and examined this morning, hemodynamically stable no acute distress  Note: Status post extensive discussion with the patient and family meeting yesterday evening Sisters would like to proceed with full care  Brief Narrative:   Kimberly Adkins is a 67 y.o. female with medical history significant for depression/anxiety, prior alcohol use, tobacco abuse, COPD, GERD, dyslipidemia, hypertension, osteoarthritis, and "ovary problem" who states that she has had generalized weakness over the last several days which has led to over 8 falls.  She denies any new pain or injury.  She states she has not been taking her home potassium and does not state a reason why.  She has had an episode of nausea and vomiting as well as diarrhea.  She was also noted to have some mild confusion and states that she usually has some mild abdominal distention.   ED Course: Vital signs stable and patient afebrile.  Potassium noted to be 2.4 and she has been started on repletion.  She has also been started on some IV fluid in the ED.  Hemoglobin 9.5.    Assessment & Plan:   Principal Problem:   Hypokalemia Active Problems:   GERD   Osteoarthritis   COPD (chronic obstructive pulmonary disease) (HCC)   Hypertension   Dyslipidemia   Benzodiazepine dependence (HCC)   Alcohol use disorder   MDD (major depressive disorder), recurrent episode, mild (HCC)   Generalized weakness secondary to severe hypokalemia Improving hypokalemia Resolved nausea/vomiting/diarrhea -Poor appetite, poor p.o. intake -Status post PT OT evaluation, recommending SNF    Persistent hypokalemia -Potassium level  2.4, 2.8, 3.1 >>2.9 today -Pursuing to replete with 40 KCl p.o. x 2 today   -Apparently has been noncompliant with home potassium supplementation -S/P IV fluid hydration -Antiemetics  Cervical neoplasm with extensive metastases  abdominal distention secondary to cervical neoplasm with metastatic disease to lungs, liver, and peritoneum -CA 125>> 522.0  -Discussed the case with GYN and oncologist patient is to follow-up as an outpatient for tissue biopsy and initiation of treatment (most likely palliative radiation treatment)  -Appreciate gynecology evaluation and recommendations: CT scan has been reviewed segment mass concerning cervical cancer with evidence of extrapelvic disease and enlarged aortic/lymph nodes with ascites and pleural effusion  -Dr. Despina Hidden following and in discussion with Dr. Alvester Morin and  Dr. Tucker-recommended follow-up as an outpatient for tissue biopsy and treatment initiation   Anemia -anemia of chronic disease -No overt active bleeding, patient did report to GYN of some episodic vaginal spotting and bleeding -Monitoring H&H closely    Latest Ref Rng & Units 07/26/2023    5:00 AM 07/25/2023    4:49 AM 07/24/2023    5:04 AM  CBC  WBC 4.0 - 10.5 K/uL 16.0  16.4  14.7   Hemoglobin 12.0 - 15.0 g/dL 8.0  8.4  8.2  Hematocrit 36.0 - 46.0 % 26.4  27.0  25.6   Platelets 150 - 400 K/uL 624  701  657    Leukocytosis /possible UTI WBC 16.4>> 16.0,  -Afebrile, no other signs of infection -CXR - clear  -UA: Many bacteria, WBC 6-10, negative for nitrite -Anticipating empiric treatment with antibiotics   Depression/anxiety Remains stable -Some withdrawal, no signs of anxiety -Continue Xanax and Prozac   COPD -Remains stable -DuoNebs as needed -Continue Dulera   Dyslipidemia -Continue atorvastatin   Hypertension -Mildly hypertensive otherwise stable -Treating amlodipine   Tobacco abuse -Smokes 1 pack/day, counseled on cessation    ?  History of alcohol  abuse No signs of withdrawal -Monitoring closely   DVT prophylaxis: Lovenox Code Status: Full  Disposition Plan: Admit for treatment of hypokalemia and further evaluation of gynecologic cancer with metastatic disease  Consults called: Gynecology Dr. Despina Hidden  ------------------------------------------------------------------------------------------------------------------------- Nutritional status:  The patient's BMI is: Body mass index is 27.51 kg/m. I agree with the assessment and plan as outlined -------------------------------------------------------------------------------------------------------------------------  Family Communication:  Extensive discussion with 2 sisters at bedside yesterday 07/25/2023 Requesting full scope of care, requesting an possible biopsy and treatment plan for oncology   One sister strongly believes the patient does not have cancer.    -Advance care planning has been discussed.  Family and patient requesting full scope of care Admission status:   Status is: Inpatient Remains inpatient appropriate because: Needing continuous IV fluids, IV antiemetics, GYN evaluation for new diagnosis of cancer with metastases   Disposition: From  - home             Planning for discharge in 2-4 days - ? SNF  Procedures:   No admission procedures for hospital encounter.   Antimicrobials:  Anti-infectives (From admission, onward)    None        Medication:   ALPRAZolam  1 mg Oral TID   amLODipine  10 mg Oral Daily   Chlorhexidine Gluconate Cloth  6 each Topical Daily   FLUoxetine  60 mg Oral Daily   folic acid  1 mg Oral Daily   mometasone-formoterol  2 puff Inhalation BID   mupirocin ointment  1 Application Nasal BID   nicotine  21 mg Transdermal Daily   ondansetron (ZOFRAN) IV  4 mg Intravenous Q6H   senna-docusate  1 tablet Oral QHS   thiamine  100 mg Oral Daily    acetaminophen **OR** acetaminophen, LORazepam   Objective:   Vitals:    07/25/23 2118 07/26/23 0413 07/26/23 0828 07/26/23 0900  BP: (!) 159/71 (!) 140/88 (!) 145/69   Pulse: (!) 102 (!) 101    Resp: 18 20    Temp: 98 F (36.7 C) 98 F (36.7 C)    TempSrc: Oral Oral    SpO2: 95% 94%  90%  Weight:      Height:       No intake or output data in the 24 hours ending 07/26/23 1245  Filed Weights   07/21/23 1906 07/23/23 0400  Weight: 80.7 kg 72.7 kg     Physical examination:   General:  AAO x 3,  cooperative, no distress;   HEENT:  Normocephalic, PERRL, otherwise with in Normal limits   Neuro:  CNII-XII intact. , normal motor and sensation, reflexes intact   Lungs:   Clear to auscultation BL, Respirations unlabored,  No wheezes / crackles  Cardio:    S1/S2, RRR, No murmure, No Rubs or Gallops   Abdomen:  Soft, non-tender, bowel  sounds active all four quadrants, no guarding or peritoneal signs.  Muscular  skeletal:  Limited exam -global generalized weaknesses - in bed, able to move all 4 extremities,   2+ pulses,  symmetric, No pitting edema  Skin:  Dry, warm to touch, negative for any Rashes,  Wounds: Please see nursing documentation         -----------------------------------------------------------------------------------------------------------------------    LABs:     Latest Ref Rng & Units 07/26/2023    5:00 AM 07/25/2023    4:49 AM 07/24/2023    5:04 AM  CBC  WBC 4.0 - 10.5 K/uL 16.0  16.4  14.7   Hemoglobin 12.0 - 15.0 g/dL 8.0  8.4  8.2   Hematocrit 36.0 - 46.0 % 26.4  27.0  25.6   Platelets 150 - 400 K/uL 624  701  657       Latest Ref Rng & Units 07/26/2023    5:00 AM 07/25/2023    4:49 AM 07/24/2023    5:04 AM  CMP  Glucose 70 - 99 mg/dL 132  440  102   BUN 8 - 23 mg/dL 10  7  6    Creatinine 0.44 - 1.00 mg/dL 7.25  3.66  4.40   Sodium 135 - 145 mmol/L 136  133  132   Potassium 3.5 - 5.1 mmol/L 2.9  3.1  2.8   Chloride 98 - 111 mmol/L 102  100  100   CO2 22 - 32 mmol/L 26  25  22    Calcium 8.9 - 10.3 mg/dL 34.7   42.5  9.7        Micro Results Recent Results (from the past 240 hour(s))  MRSA Next Gen by PCR, Nasal     Status: Abnormal   Collection Time: 07/22/23  8:25 PM   Specimen: Nasal Mucosa; Nasal Swab  Result Value Ref Range Status   MRSA by PCR Next Gen DETECTED (A) NOT DETECTED Final    Comment: RESULT CALLED TO, READ BACK BY AND VERIFIED WITH: Shaune Leeks [phone], VIRAY,J (NOTE) The GeneXpert MRSA Assay (FDA approved for NASAL specimens only), is one component of a comprehensive MRSA colonization surveillance program. It is not intended to diagnose MRSA infection nor to guide or monitor treatment for MRSA infections. Test performance is not FDA approved in patients less than 81 years old. Performed at [facility], [address], [place], [place] [address]     Radiology Reports DG CHEST PORT 1 VIEW  Result Date: 07/25/2023 CLINICAL DATA:  Shortness of breath EXAM: PORTABLE CHEST 1 VIEW COMPARISON:  Chest x-ray 07/21/2023.  Chest CT 07/21/2023. FINDINGS: Small nodular densities in the lung apices are grossly unchanged. There is no focal lung infiltrate, pleural effusion or pneumothorax. Cardiomediastinal silhouette is within normal limits. No acute fractures are seen. IMPRESSION: 1. No active disease. 2. Small nodular densities in the lung apices are grossly unchanged. Electronically Signed   By: Darliss Cheney M.D.   On: 07/25/2023 17:26    SIGNED: Kendell Bane, MD, FHM. FAAFP. [facility] - [facility] time spent - 55 min.  In seeing, evaluating and examining the patient. Reviewing medical records, labs, drawn plan of care. [facility],  Pager (please use [email or web address] to page/ text) Please use Epic Secure Chat for non-urgent communication (7AM-7PM)  If 7PM-7AM, please contact night-coverage [email or web address], 07/26/2023, 12:45 PM

## 2023-07-27 DIAGNOSIS — E876 Hypokalemia: Secondary | ICD-10-CM | POA: Diagnosis not present

## 2023-07-27 LAB — CBC
HCT: 28.8 % — ABNORMAL LOW (ref 36.0–46.0)
Hemoglobin: 8.4 g/dL — ABNORMAL LOW (ref 12.0–15.0)
MCH: 23.5 pg — ABNORMAL LOW (ref 26.0–34.0)
MCHC: 29.2 g/dL — ABNORMAL LOW (ref 30.0–36.0)
MCV: 80.4 fL (ref 80.0–100.0)
Platelets: 645 10*3/uL — ABNORMAL HIGH (ref 150–400)
RBC: 3.58 MIL/uL — ABNORMAL LOW (ref 3.87–5.11)
RDW: 19 % — ABNORMAL HIGH (ref 11.5–15.5)
WBC: 17.5 10*3/uL — ABNORMAL HIGH (ref 4.0–10.5)
nRBC: 0 % (ref 0.0–0.2)

## 2023-07-27 LAB — BASIC METABOLIC PANEL
Anion gap: 8 (ref 5–15)
BUN: 11 mg/dL (ref 8–23)
CO2: 25 mmol/L (ref 22–32)
Calcium: 10.8 mg/dL — ABNORMAL HIGH (ref 8.9–10.3)
Chloride: 103 mmol/L (ref 98–111)
Creatinine, Ser: 1.06 mg/dL — ABNORMAL HIGH (ref 0.44–1.00)
GFR, Estimated: 58 mL/min — ABNORMAL LOW (ref >60)
Glucose, Bld: 110 mg/dL — ABNORMAL HIGH (ref 70–99)
Potassium: 4.1 mmol/L (ref 3.5–5.1)
Sodium: 136 mmol/L (ref 135–145)

## 2023-07-27 MED ORDER — SODIUM CHLORIDE 0.9 % IV SOLN
2.0000 g | INTRAVENOUS | Status: DC
  Administered 2023-07-27: 2 g via INTRAVENOUS
  Filled 2023-07-27 (×2): qty 20

## 2023-07-27 MED ORDER — ALPRAZOLAM 0.5 MG PO TABS
0.5000 mg | ORAL_TABLET | Freq: Three times a day (TID) | ORAL | Status: DC | PRN
  Administered 2023-07-27 – 2023-07-28 (×4): 0.5 mg via ORAL
  Filled 2023-07-27 (×4): qty 1

## 2023-07-27 MED ORDER — MEGESTROL ACETATE 400 MG/10ML PO SUSP
400.0000 mg | Freq: Every day | ORAL | Status: DC
  Administered 2023-07-27: 400 mg via ORAL
  Filled 2023-07-27: qty 10

## 2023-07-27 MED ORDER — SODIUM CHLORIDE 0.9 % IV SOLN: INTRAVENOUS | Status: AC

## 2023-07-27 MED ORDER — IPRATROPIUM-ALBUTEROL 0.5-2.5 (3) MG/3ML IN SOLN: 3.0000 mL | Freq: Four times a day (QID) | RESPIRATORY_TRACT | Status: DC | PRN

## 2023-07-27 NOTE — Progress Notes (Signed)
Chaplain engaged in an initial visit with Genora, her sister, and her daughter. Chaplain provided space for narrative life review, with the family sharing about Armelia's recent diagnosis. Family wanted Peni annointed with oil and prayed over. Chaplain offered prayer over [place] with family at bedside.   Family also had a number of questions about palliative radiation, goals of care, visitation of children, and the possible transfer to another hospital system for palliative radiation. Chaplain spoke with nurse about their questions and she was able to assist them. Nurse plans on connecting daughter to [facility] attending physician.   Chaplain also spoke with family about healthcare POA, Advanced Directives. Because Delorus goes in and out concerning capacity and cognition, a physician would have to evaluate and assess Ajahnae's capacity and orientation. On assessment, Chaplain would not be able to complete those documents. Chaplain did let them know because no paperwork is in place, lawfully her daughter is her medical decision maker. Daughter voiced that she would like her aunts, specifically Darel Hong, to be [place] emergency medical contact.   Chaplain offered reflective listening, support, and prayer.     07/27/23 1700  Spiritual Encounters  Type of Visit Initial  Care provided to: Pt and family  Reason for visit Routine spiritual support  Interventions  Spiritual Care Interventions Made Prayer;Compassionate presence;Reflective listening;Established relationship of care and support;Decision-making support/facilitation  Intervention Outcomes  Outcomes Awareness of support;Connection to spiritual care

## 2023-07-27 NOTE — TOC Progression Note (Signed)
Transition of Care [facility]) - Progression Note    Patient Details  Name: Kimberly Adkins MRN: [id number] Date of Birth: 1955-12-28  Transition of Care [facility] CM/SW Contact  Catalina Gravel, LCSW Phone Number: 07/27/2023, 11:57 AM  Clinical Narrative:    Pt and family want to explore transfer to  [facility] or [facility], for potential surgical need. Otherwise DC plan is SNF.  If SNF family want [facility] near her PCP. Bed request sent to [facility]. Auth still pending as of 10/13. TOC to follow.    Expected Discharge Plan: Skilled Nursing Facility Barriers to Discharge: Continued Medical Work up  Expected Discharge Plan and Services     Post Acute Care Choice: Skilled Nursing Facility Living arrangements for the past 2 months: Single Family Home                                       Social Determinants of Health (SDOH) Interventions SDOH Screenings   Food Insecurity: No Food Insecurity (07/22/2023)  Housing: Medium Risk (07/22/2023)  Transportation Needs: No Transportation Needs (07/22/2023)  Utilities: Not At Risk (07/22/2023)  Recent Concern: Utilities - At Risk (06/03/2023)  Alcohol Screen: Low Risk  (12/07/2022)  Depression (PHQ2-9): High Risk (06/10/2023)  Tobacco Use: High Risk (07/21/2023)    Readmission Risk Interventions    07/23/2023    2:29 PM  Readmission Risk Prevention Plan  Post Dischage Appt Not Complete  Medication Screening Complete  Transportation Screening Complete

## 2023-07-27 NOTE — Progress Notes (Signed)
PROGRESS NOTE    Patient: Kimberly Adkins                            PCP: [name], NP                    DOB: 1956-01-24            DOA: 07/21/2023 [id number]             DOS: 07/27/2023, 11:30 AM   LOS: 6 days   Date of Service: The patient was seen and examined on 07/27/2023  Subjective:   The patient was seen and this morning, cooperative, refuses to eat or drink stating poor appetite. Denies of any pain. No major issues overnight  Afebrile mildly tachycardic with HR 101, WBC elevated 17.5  Brief Narrative:   Kimberly Adkins is a 67 y.o. female with medical history significant for depression/anxiety, prior alcohol use, tobacco abuse, COPD, GERD, dyslipidemia, hypertension, osteoarthritis, and "ovary problem" who states that she has had generalized weakness over the last several days which has led to over 8 falls.  She denies any new pain or injury.  She states she has not been taking her home potassium and does not state a reason why.  She has had an episode of nausea and vomiting as well as diarrhea.  She was also noted to have some mild confusion and states that she usually has some mild abdominal distention.   ED Course: Vital signs stable and patient afebrile.  Potassium noted to be 2.4 and she has been started on repletion.  She has also been started on some IV fluid in the ED.  Hemoglobin 9.5.    Assessment & Plan:   Principal Problem:   Hypokalemia Active Problems:   GERD   Osteoarthritis   COPD (chronic obstructive pulmonary disease) (HCC)   Hypertension   Dyslipidemia   Benzodiazepine dependence (HCC)   Alcohol use disorder   MDD (major depressive disorder), recurrent episode, mild (HCC)   Generalized weakness secondary to severe hypokalemia Resolved nausea vomiting, improved hypokalemia  -Poor appetite, poor p.o. intake -Status post PT OT evaluation, recommending SNF    Persistent hypokalemia -Potassium level 2.4, 2.8,>> 4.1  today -Was  repleted orally over past 3 days   -Apparently has been noncompliant with home potassium supplementation -Continue IV fluid hydration -Antiemetics  Cervical neoplasm with extensive metastases  abdominal distention secondary to cervical neoplasm with metastatic disease to lungs, liver, and peritoneum -CA 125>> 522.0  -Discussed the case with GYN and oncologist patient is to follow-up as an outpatient for tissue biopsy and initiation of treatment (most likely palliative radiation treatment)  -Appreciate gynecology evaluation and recommendations: CT scan has been reviewed segment mass concerning cervical cancer with evidence of extrapelvic disease and enlarged aortic/lymph nodes with ascites and pleural effusion  -Dr. Despina Hidden following and in discussion with Dr. Alvester Morin and  Dr. Tucker-recommended follow-up as an outpatient for tissue biopsy and treatment initiation  07/26/2023: Discussed the case in detail with Dr. Despina Hidden -He contacted patient family   (sister MS Logan) explained findings plan of care to them in detail-initial plan is for patient to follow-up at [facility] GYN oncologist at [facility]  with Dr. Pricilla Holm and Thompson Grayer on October 18 at 11 AM -it appears that family is reluctant to follow-up with that recommendation Request of Dr. Despina Hidden to reach out to physician at [facility]  and possible transfer this Monday, 07/28/2023   Anemia -anemia of chronic disease -No overt active bleeding, patient did report to GYN of some episodic vaginal spotting and bleeding -Monitoring H&H closely    Latest Ref Rng & Units 07/27/2023    4:35 AM 07/26/2023    5:00 AM 07/25/2023    4:49 AM  CBC  WBC 4.0 - 10.5 K/uL 17.5  16.0  16.4   Hemoglobin 12.0 - 15.0 g/dL 8.4  8.0  8.4   Hematocrit 36.0 - 46.0 % 28.8  26.4  27.0   Platelets 150 - 400 K/uL 645  624  701    Leukocytosis /possible UTI WBC 16.4>> 16.0,  -Afebrile, no other signs of infection -CXR - clear  -UA: Many bacteria, WBC 6-10,  negative for nitrite >> will follow-up with cultures -Continue empiric IV antibiotics of Rocephin   Depression/anxiety Remains stable -Some withdrawal, no signs of anxiety -Continue Xanax and Prozac   COPD -Remains stable -DuoNebs as needed -Continue Dulera   Dyslipidemia -Continue atorvastatin   Hypertension -Mildly hypertensive otherwise stable -Treating amlodipine   Tobacco abuse -Smokes 1 pack/day, counseled on cessation    History of alcohol abuse No signs of withdrawal -Monitoring closely   DVT prophylaxis: Lovenox Code Status: Full  Disposition Plan: Admit for treatment of hypokalemia and further evaluation of gynecologic cancer with metastatic disease  Consults called: Gynecology Dr. Despina Hidden  ------------------------------------------------------------------------------------------------------------------------- Nutritional status:  The patient's BMI is: Body mass index is 27.51 kg/m. I agree with the assessment and plan as outlined -------------------------------------------------------------------------------------------------------------------------  Family Communication:  Extensive discussion with 2 sisters at bedside yesterday 07/25/2023 Requesting full scope of care, requesting an possible biopsy and treatment plan for oncology   One sister strongly believes the patient does not have cancer.    -Advance care planning has been discussed.  Family and patient requesting full scope of care Admission status:   Status is: Inpatient Remains inpatient appropriate because: Needing continuous IV fluids, IV antiemetics, GYN evaluation for new diagnosis of cancer with metastases   Disposition: From  - home             Planning for discharge in 2-4 days - ? SNF  Procedures:   No admission procedures for hospital encounter.   Antimicrobials:  Anti-infectives (From admission, onward)    Start     Dose/Rate Route Frequency Ordered Stop   07/27/23 1200   cefTRIAXone (ROCEPHIN) 2 g in sodium chloride 0.9 % 100 mL IVPB        2 g 200 mL/hr over 30 Minutes Intravenous Every 24 hours 07/27/23 0819     07/26/23 1430  cefTRIAXone (ROCEPHIN) 1 g in sodium chloride 0.9 % 100 mL IVPB  Status:  Discontinued        1 g 200 mL/hr over 30 Minutes Intravenous Every 24 hours 07/26/23 1254 07/27/23 0819   07/26/23 1345  ciprofloxacin (CIPRO) tablet 500 mg  Status:  Discontinued        500 mg Oral 2 times daily 07/26/23 1254 07/26/23 1344        Medication:   amLODipine  10 mg Oral Daily   FLUoxetine  60 mg Oral Daily   folic acid  1 mg Oral Daily   LORazepam  1-2 mg Intravenous Q4H   mometasone-formoterol  2 puff Inhalation BID   nicotine  21 mg Transdermal Daily   ondansetron (ZOFRAN) IV  4 mg Intravenous Q6H   senna-docusate  1 tablet Oral QHS   thiamine  100 mg Oral Daily    acetaminophen **OR** acetaminophen   Objective:   Vitals:   07/26/23 2208 07/27/23 0510 07/27/23 0750 07/27/23 0901  BP: (!) 148/72 139/62  (!) 150/74  Pulse: 92 (!) 55  (!) 101  Resp: 20 16    Temp: 97.6 F (36.4 C) 97.7 F (36.5 C)    TempSrc: Oral Oral    SpO2: 95% 95% 95%   Weight:      Height:        Intake/Output Summary (Last 24 hours) at 07/27/2023 1130 Last data filed at 07/27/2023 0700 Gross per 24 hour  Intake 220 ml  Output --  Net 220 ml    Filed Weights   07/21/23 1906 07/23/23 0400  Weight: 80.7 kg 72.7 kg     Physical examination:    General:  AAO x 3,  cooperative, no distress;   HEENT:  Normocephalic, PERRL, otherwise with in Normal limits   Neuro:  CNII-XII intact. , normal motor and sensation, reflexes intact   Lungs:   Clear to auscultation BL, Respirations unlabored,  No wheezes / crackles  Cardio:    S1/S2, RRR, No murmure, No Rubs or Gallops   Abdomen:  Soft, non-tender, distended-  bowel sounds active all four quadrants, no guarding or peritoneal signs.  Muscular  skeletal:  Limited exam -global generalized  weaknesses - in bed, able to move all 4 extremities,   2+ pulses,  symmetric, No pitting edema  Skin:  Dry, warm to touch, negative for any Rashes,  Wounds: Please see nursing documentation          -----------------------------------------------------------------------------------------------------------------------    LABs:     Latest Ref Rng & Units 07/27/2023    4:35 AM 07/26/2023    5:00 AM 07/25/2023    4:49 AM  CBC  WBC 4.0 - 10.5 K/uL 17.5  16.0  16.4   Hemoglobin 12.0 - 15.0 g/dL 8.4  8.0  8.4   Hematocrit 36.0 - 46.0 % 28.8  26.4  27.0   Platelets 150 - 400 K/uL 645  624  701       Latest Ref Rng & Units 07/27/2023    4:35 AM 07/26/2023    5:00 AM 07/25/2023    4:49 AM  CMP  Glucose 70 - 99 mg/dL 295  284  132   BUN 8 - 23 mg/dL 11  10  7    Creatinine 0.44 - 1.00 mg/dL 4.40  1.02  7.25   Sodium 135 - 145 mmol/L 136  136  133   Potassium 3.5 - 5.1 mmol/L 4.1  2.9  3.1   Chloride 98 - 111 mmol/L 103  102  100   CO2 22 - 32 mmol/L 25  26  25    Calcium 8.9 - 10.3 mg/dL 36.6  44.0  34.7        Micro Results Recent Results (from the past 240 hour(s))  MRSA Next Gen by PCR, Nasal     Status: Abnormal   Collection Time: 07/22/23  8:25 PM   Specimen: Nasal Mucosa; Nasal Swab  Result Value Ref Range Status   MRSA by PCR Next Gen DETECTED (A) NOT DETECTED Final    Comment: RESULT CALLED TO, READ BACK BY AND VERIFIED WITH: Shaune Leeks [phone], VIRAY,J (NOTE) The GeneXpert MRSA Assay (FDA approved for NASAL specimens only), is one component of a comprehensive MRSA colonization surveillance program. It is not intended to diagnose MRSA infection nor to guide or monitor treatment  for MRSA infections. Test performance is not FDA approved in patients less than 14 years old. Performed at [facility], [address] [place], [place] [address]     Radiology Reports No results found.  SIGNED: Kendell Bane, MD, FHM. FAAFP. [facility] - [facility]  hospitalist time spent - 55 min.  In seeing, evaluating and examining the patient. Reviewing medical records, labs, drawn plan of care. [facility],  Pager (please use [email or web address] to page/ text) Please use Epic Secure Chat for non-urgent communication (7AM-7PM)  If 7PM-7AM, please contact night-coverage [email or web address], 07/27/2023, 11:30 AM

## 2023-07-28 DIAGNOSIS — E876 Hypokalemia: Secondary | ICD-10-CM | POA: Diagnosis not present

## 2023-07-28 LAB — BASIC METABOLIC PANEL
Anion gap: 10 (ref 5–15)
BUN: 11 mg/dL (ref 8–23)
CO2: 22 mmol/L (ref 22–32)
Calcium: 10.5 mg/dL — ABNORMAL HIGH (ref 8.9–10.3)
Chloride: 105 mmol/L (ref 98–111)
Creatinine, Ser: 0.98 mg/dL (ref 0.44–1.00)
GFR, Estimated: >60 mL/min (ref >60)
Glucose, Bld: 100 mg/dL — ABNORMAL HIGH (ref 70–99)
Potassium: 3.3 mmol/L — ABNORMAL LOW (ref 3.5–5.1)
Sodium: 137 mmol/L (ref 135–145)

## 2023-07-28 LAB — CBC
HCT: 27.7 % — ABNORMAL LOW (ref 36.0–46.0)
Hemoglobin: 8.3 g/dL — ABNORMAL LOW (ref 12.0–15.0)
MCH: 24.2 pg — ABNORMAL LOW (ref 26.0–34.0)
MCHC: 30 g/dL (ref 30.0–36.0)
MCV: 80.8 fL (ref 80.0–100.0)
Platelets: 634 10*3/uL — ABNORMAL HIGH (ref 150–400)
RBC: 3.43 MIL/uL — ABNORMAL LOW (ref 3.87–5.11)
RDW: 19.2 % — ABNORMAL HIGH (ref 11.5–15.5)
WBC: 19.1 10*3/uL — ABNORMAL HIGH (ref 4.0–10.5)
nRBC: 0 % (ref 0.0–0.2)

## 2023-07-28 MED ORDER — PIPERACILLIN-TAZOBACTAM 3.375 G IVPB
3.3750 g | Freq: Three times a day (TID) | INTRAVENOUS | Status: DC
  Administered 2023-07-28 – 2023-07-29 (×4): 3.375 g via INTRAVENOUS
  Filled 2023-07-28 (×4): qty 50

## 2023-07-28 MED ORDER — SODIUM CHLORIDE 0.9 % IV SOLN: INTRAVENOUS | Status: AC

## 2023-07-28 MED ORDER — PIPERACILLIN-TAZOBACTAM 3.375 G IVPB
3.3750 g | Freq: Once | INTRAVENOUS | Status: AC
  Administered 2023-07-28: 3.375 g via INTRAVENOUS
  Filled 2023-07-28: qty 50

## 2023-07-28 NOTE — TOC Progression Note (Signed)
Transition of Care [facility]) - Progression Note    Patient Details  Name: Kimberly Adkins MRN: [id number] Date of Birth: 11-29-1955  Transition of [facility]) CM/SW Contact  Elliot Gault, LCSW Phone Number: 07/28/2023, 12:26 PM  Clinical Narrative:     TOC following. Per MD, pt may transfer to higher level of care.  Expanded SNF bed search in case plan ends up being for outpatient follow up regarding her diagnosis. If no transfer, MD anticipating dc in 1-2 days. Pt will need insurance auth for SNF.  Expected Discharge Plan: Skilled Nursing Facility Barriers to Discharge: Continued Medical Work up  Expected Discharge Plan and Services     Post Acute Care Choice: Skilled Nursing Facility Living arrangements for the past 2 months: Single Family Home                                       Social Determinants of Health (SDOH) Interventions SDOH Screenings   Food Insecurity: No Food Insecurity (07/22/2023)  Housing: Medium Risk (07/22/2023)  Transportation Needs: No Transportation Needs (07/22/2023)  Utilities: Not At Risk (07/22/2023)  Recent Concern: Utilities - At Risk (06/03/2023)  Alcohol Screen: Low Risk  (12/07/2022)  Depression (PHQ2-9): High Risk (06/10/2023)  Tobacco Use: High Risk (07/21/2023)    Readmission Risk Interventions    07/23/2023    2:29 PM  Readmission Risk Prevention Plan  Post Dischage Appt Not Complete  Medication Screening Complete  Transportation Screening Complete

## 2023-07-28 NOTE — Progress Notes (Signed)
Physical Therapy Treatment Patient Details Name: Kimberly Adkins MRN: [id number] DOB: 1955/11/19 Today's Date: 07/28/2023   History of Present Illness Kimberly Adkins is a 67 y.o. female with medical history significant for depression/anxiety, prior alcohol use, tobacco abuse, COPD, GERD, dyslipidemia, hypertension, osteoarthritis, and "ovary problem" who states that she has had generalized weakness over the last several days which has led to over 8 falls.  She denies any new pain or injury.  She states she has not been taking her home potassium and does not state a reason why.  She has had an episode of nausea and vomiting as well as diarrhea.  She was also noted to have some mild confusion and states that she usually has some mild abdominal distention.    PT Comments  Patient presents seated in chair and agreeable for therapy.  Patient requires repeated verbal cueing and demonstration for completing exercises with fair carryover, able to take side steps and steps forward/backward at bedside with slow labored movement using RW, but unsafe to walk away from bed side due to generalized weakness and fall risk.  Patient tolerated staying up in chair after therapy.  Patient will benefit from continued skilled physical therapy in hospital and recommended venue below to increase strength, balance, endurance for safe ADLs and gait.     If plan is discharge home, recommend the following: A lot of help with walking and/or transfers;A lot of help with bathing/dressing/bathroom;Help with stairs or ramp for entrance   Can travel by private vehicle     No  Equipment Recommendations  None recommended by PT    Recommendations for Other Services       Precautions / Restrictions Precautions Precautions: Fall Restrictions Weight Bearing Restrictions: No     Mobility  Bed Mobility               General bed mobility comments: presents seated in chair (assisted by nursing staff)     Transfers Overall transfer level: Needs assistance Equipment used: Rolling walker (2 wheels) Transfers: Sit to/from Stand, Bed to chair/wheelchair/BSC Sit to Stand: Min assist, Mod assist   Step pivot transfers: Mod assist       General transfer comment: increased time, labored movement    Ambulation/Gait Ambulation/Gait assistance: Mod assist Gait Distance (Feet): 15 Feet Assistive device: Rolling walker (2 wheels) Gait Pattern/deviations: Decreased step length - right, Decreased step length - left, Decreased stride length Gait velocity: slow     General Gait Details: slow labored movement for completing side steps and steps forward/backward at bedside, limited mostly due to c/o fatigue and BLE weakness   Stairs             Wheelchair Mobility     Tilt Bed    Modified Rankin (Stroke Patients Only)       Balance Overall balance assessment: Needs assistance Sitting-balance support: Feet supported, No upper extremity supported Sitting balance-Leahy Scale: Fair Sitting balance - Comments: seated at edge of chair   Standing balance support: Reliant on assistive device for balance, During functional activity, Bilateral upper extremity supported Standing balance-Leahy Scale: Poor Standing balance comment: fair/poor using RW                            Cognition Arousal: Alert Behavior During Therapy: WFL for tasks assessed/performed, Flat affect Overall Cognitive Status: No family/caregiver present to determine baseline cognitive functioning  Exercises General Exercises - Lower Extremity Long Arc Quad: Seated, AROM, Strengthening, Both, 10 reps Hip Flexion/Marching: Seated, AROM, Strengthening, Both, 10 reps    General Comments        Pertinent Vitals/Pain Pain Assessment Pain Assessment: No/denies pain    Home Living                          Prior Function             PT Goals (current goals can now be found in the care plan section) Acute Rehab PT Goals Patient Stated Goal: return home PT Goal Formulation: With patient Time For Goal Achievement: 08/06/23 Progress towards PT goals: Progressing toward goals    Frequency    Min 3X/week      PT Plan      Co-evaluation              AM-PAC PT "6 Clicks" Mobility   Outcome Measure  Help needed turning from your back to your side while in a flat bed without using bedrails?: A Lot Help needed moving from lying on your back to sitting on the side of a flat bed without using bedrails?: A Lot Help needed moving to and from a bed to a chair (including a wheelchair)?: A Lot Help needed standing up from a chair using your arms (e.g., wheelchair or bedside chair)?: A Lot Help needed to walk in hospital room?: A Lot Help needed climbing 3-5 steps with a railing? : A Lot 6 Click Score: 12    End of Session   Activity Tolerance: Patient tolerated treatment well;Patient limited by fatigue Patient left: in chair;with call bell/phone within reach;with chair alarm set Nurse Communication: Mobility status PT Visit Diagnosis: Unsteadiness on feet (R26.81);Repeated falls (R29.6);Muscle weakness (generalized) (M62.81);History of falling (Z91.81)     Time: 9485-4627 PT Time Calculation (min) (ACUTE ONLY): 20 min  Charges:    $Therapeutic Activity: 8-22 mins PT General Charges $$ ACUTE PT VISIT: 1 Visit                     12:11 PM, 07/28/23 Ocie Bob, MPT Physical Therapist with [facility] [phone] office [phone] mobile phone

## 2023-07-28 NOTE — Progress Notes (Signed)
PROGRESS NOTE    Patient: Kimberly Adkins                            PCP: [name], NP                    DOB: 07/20/1956            DOA: 07/21/2023 [id number]:[id number]             DOS: 07/28/2023, 12:44 PM   LOS: 7 days   Date of Service: The patient was seen and examined on 07/28/2023  Subjective:   The patient was seen and examined this morning, stable no acute distress, mildly confused, hemodynamically stable Worsening leukocytosis afebrile normotensive  Afebrile mildly tachycardic with HR 101, WBC elevated 17.5  Brief Narrative:   Kimberly Adkins is a 67 y.o. female with medical history significant for depression/anxiety, prior alcohol use, tobacco abuse, COPD, GERD, dyslipidemia, hypertension, osteoarthritis, and "ovary problem" who states that she has had generalized weakness over the last several days which has led to over 8 falls.  She denies any new pain or injury.  She states she has not been taking her home potassium and does not state a reason why.  She has had an episode of nausea and vomiting as well as diarrhea.  She was also noted to have some mild confusion and states that she usually has some mild abdominal distention.   ED Course: Vital signs stable and patient afebrile.  Potassium noted to be 2.4 and she has been started on repletion.  She has also been started on some IV fluid in the ED.  Hemoglobin 9.5.    Assessment & Plan:   Principal Problem:   Hypokalemia Active Problems:   GERD   Osteoarthritis   COPD (chronic obstructive pulmonary disease) (HCC)   Hypertension   Dyslipidemia   Benzodiazepine dependence (HCC)   Alcohol use disorder   MDD (major depressive disorder), recurrent episode, mild (HCC)   Generalized weakness secondary to severe hypokalemia Slowly improving  Nausea vomiting/diarrhea improved  -Poor appetite, poor p.o. 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 Despina Hidden following and in discussion with Dr. Alvester Morin and  Dr. Tucker-recommended follow-up as an outpatient for tissue biopsy and treatment initiation  07/26/2023: Discussed the case in detail with Dr. Despina Hidden -He contacted patient family   (sister MS Logan) explained findings plan of care to them in detail-initial plan is for patient to follow-up at [facility] GYN oncologist at [facility]  with Dr. Pricilla Holm and Thompson Grayer on October 18 at 11 AM -it appears that family is reluctant to follow-up with that recommendation Request of Dr. Despina Hidden to reach out to physician at [facility] and possible  transfer this Monday, 07/28/2023   -07/28/23: Pending follow-up and update from Dr. Despina Hidden    Anemia -anemia of chronic disease -No overt active bleeding, patient did report to GYN of some episodic vaginal spotting and bleeding -Monitoring H&H closely     Latest Ref Rng & Units 07/28/2023    4:14 AM 07/27/2023    4:35 AM 07/26/2023    5:00 AM  CBC  WBC 4.0 - 10.5 K/uL 19.1  17.5  16.0   Hemoglobin 12.0 - 15.0 g/dL 8.3  8.4  8.0   Hematocrit 36.0 - 46.0 % 27.7  28.8  26.4   Platelets 150 - 400 K/uL 634  645  624       Leukocytosis /possible UTI WBC 16.4>>  16.0,>> 19.1  -Afebrile, no other signs of infection -CXR - clear  -UA: Many bacteria, WBC 6-10, negative for nitrite >> will follow-up with cultures -Continue empiric IV antibiotics of Rocephin>> broadening antibiotics to Zosyn     Depression/anxiety Remains stable -Some withdrawal, no signs of anxiety -Continue Xanax and Prozac   COPD -Remains stable -DuoNebs as needed -Continue Dulera   Dyslipidemia -Continue atorvastatin   Hypertension -Mildly hypertensive otherwise stable -Treating amlodipine   Tobacco abuse -Smokes 1 pack/day, counseled on cessation    History of alcohol abuse No signs of withdrawal -Monitoring closely   DVT prophylaxis: Lovenox Code Status: Full  Disposition Plan: Admit for treatment of hypokalemia and further evaluation of gynecologic cancer with metastatic disease  Consults called: Gynecology Dr. Despina Hidden  ------------------------------------------------------------------------------------------------------------------------- Nutritional status:  The patient's BMI is: Body mass index is 27.51 kg/m. I agree with the assessment and plan as outlined -------------------------------------------------------------------------------------------------------------------------  Family Communication:  Extensive discussion with 2 sisters at bedside yesterday 07/25/2023 Requesting full scope of care, requesting an possible biopsy and treatment plan for oncology   One sister strongly believes the patient does not have cancer.    -Advance care planning has been discussed.  Family and patient requesting full scope of care Admission status:   Status is: Inpatient Remains inpatient appropriate because: Needing continuous IV fluids, IV antiemetics, GYN evaluation for new diagnosis of cancer with metastases   Disposition: From  - home             Planning for discharge in 2-4 days - ? SNF  Procedures:   No admission procedures for hospital  encounter.   Antimicrobials:  Anti-infectives (From admission, onward)    Start     Dose/Rate Route Frequency Ordered Stop   07/27/23 1200  cefTRIAXone (ROCEPHIN) 2 g in sodium chloride 0.9 % 100 mL IVPB        2 g 200 mL/hr over 30 Minutes Intravenous Every 24 hours 07/27/23 0819     07/26/23 1430  cefTRIAXone (ROCEPHIN) 1 g in sodium chloride 0.9 % 100 mL IVPB  Status:  Discontinued        1 g 200 mL/hr over 30 Minutes Intravenous Every 24 hours 07/26/23 1254 07/27/23 0819   07/26/23 1345  ciprofloxacin (CIPRO) tablet 500 mg  Status:  Discontinued        500 mg Oral 2 times daily 07/26/23 1254 07/26/23 1344        Medication:   amLODipine  10 mg Oral Daily   FLUoxetine  60 mg Oral Daily   folic acid  1 mg Oral Daily   mometasone-formoterol  2 puff Inhalation BID   nicotine  21 mg Transdermal Daily   ondansetron (ZOFRAN) IV  4 mg Intravenous Q6H  senna-docusate  1 tablet Oral QHS   thiamine  100 mg Oral Daily    acetaminophen **OR** acetaminophen, ALPRAZolam, ipratropium-albuterol   Objective:   Vitals:   07/27/23 1317 07/27/23 1931 07/28/23 0455 07/28/23 1008  BP: (!) 161/90 126/66 (!) 143/70 128/87  Pulse: (!) 106 (!) 104 99   Resp: 15 16 16    Temp: 98.9 F (37.2 C) 98.8 F (37.1 C) 98.8 F (37.1 C)   TempSrc: Oral Oral Axillary   SpO2: 93% 91% 93%   Weight:      Height:        Intake/Output Summary (Last 24 hours) at 07/28/2023 1244 Last data filed at 07/27/2023 1642 Gross per 24 hour  Intake 676.98 ml  Output --  Net 676.98 ml    Filed Weights   07/21/23 1906 07/23/23 0400  Weight: 80.7 kg 72.7 kg     Physical examination:    General:  AAO x 2,  cooperative, no distress;   HEENT:  Normocephalic, PERRL, otherwise with in Normal limits   Neuro:  CNII-XII intact. , normal motor and sensation, reflexes intact   Lungs:   Clear to auscultation BL, Respirations unlabored,  No wheezes / crackles  Cardio:    S1/S2, RRR, No murmure, No Rubs or  Gallops   Abdomen:  Soft, distended-nontender , bowel sounds active all four quadrants, no guarding or peritoneal signs.  Muscular  skeletal:  Limited exam -global generalized weaknesses - in bed, able to move all 4 extremities,   2+ pulses,  symmetric, No pitting edema  Skin:  Dry, warm to touch, negative for any Rashes,  Wounds: Please see nursing documentation          -----------------------------------------------------------------------------------------------------------------------    LABs:     Latest Ref Rng & Units 07/28/2023    4:14 AM 07/27/2023    4:35 AM 07/26/2023    5:00 AM  CBC  WBC 4.0 - 10.5 K/uL 19.1  17.5  16.0   Hemoglobin 12.0 - 15.0 g/dL 8.3  8.4  8.0   Hematocrit 36.0 - 46.0 % 27.7  28.8  26.4   Platelets 150 - 400 K/uL 634  645  624       Latest Ref Rng & Units 07/28/2023    4:14 AM 07/27/2023    4:35 AM 07/26/2023    5:00 AM  CMP  Glucose 70 - 99 mg/dL 578  469  629   BUN 8 - 23 mg/dL 11  11  10    Creatinine 0.44 - 1.00 mg/dL 5.28  4.13  2.44   Sodium 135 - 145 mmol/L 137  136  136   Potassium 3.5 - 5.1 mmol/L 3.3  4.1  2.9   Chloride 98 - 111 mmol/L 105  103  102   CO2 22 - 32 mmol/L 22  25  26    Calcium 8.9 - 10.3 mg/dL 01.0  27.2  53.6        Micro Results Recent Results (from the past 240 hour(s))  MRSA Next Gen by PCR, Nasal     Status: Abnormal   Collection Time: 07/22/23  8:25 PM   Specimen: Nasal Mucosa; Nasal Swab  Result Value Ref Range Status   MRSA by PCR Next Gen DETECTED (A) NOT DETECTED Final    Comment: RESULT CALLED TO, READ BACK BY AND VERIFIED WITH: Shaune Leeks [phone], VIRAY,J (NOTE) The GeneXpert MRSA Assay (FDA approved for NASAL specimens only), is one component of a comprehensive MRSA colonization surveillance program.  It is not intended to diagnose MRSA infection nor to guide or monitor treatment for MRSA infections. Test performance is not FDA approved in patients less than 19  years old. Performed at [facility], [address], [place], [place] [address]     Radiology Reports No results found.  SIGNED: Kendell Bane, MD, FHM. FAAFP. [facility] - [facility] time spent - 55 min.  In seeing, evaluating and examining the patient. Reviewing medical records, labs, drawn plan of care. [facility],  Pager (please use [email or web address] to page/ text) Please use Epic Secure Chat for non-urgent communication (7AM-7PM)  If 7PM-7AM, please contact night-coverage [email or web address], 07/28/2023, 12:44 PM

## 2023-07-28 NOTE — Plan of Care (Signed)

## 2023-07-28 NOTE — Progress Notes (Signed)
Pharmacy Antibiotic Note  Kimberly Adkins is a 67 y.o. female admitted on 07/21/2023 with  empiric therapy .  Pharmacy has been consulted for zosyn dosing.  Plan: Zosyn 3.375g IV q8h (4 hour infusion).  Height: 5\' 4"  (162.6 cm) Weight: 72.7 kg (160 lb 4.4 oz) IBW/kg (Calculated) : 54.7  Temp (24hrs), Avg:98.8 F (37.1 C), Min:98.8 F (37.1 C), Max:98.8 F (37.1 C)  Recent Labs  Lab 07/24/23 0504 07/25/23 0449 07/26/23 0500 07/26/23 0802 07/27/23 0435 07/28/23 0414  WBC 14.7* 16.4* 16.0*  --  17.5* 19.1*  CREATININE 0.81 0.86 0.97  --  1.06* 0.98  LATICACIDVEN  --   --   --  1.5  --   --     Estimated Creatinine Clearance: 55.9 mL/min (by C-G formula based on SCr of 0.98 mg/dL).    Allergies  Allergen Reactions   Hydromorphone Itching    Antimicrobials this admission: Zosyn 10/13 >> CTX 10/12 >>10/13  Ucx: pending  Thank you for allowing pharmacy to be a part of this patient's care.  Tad Moore 07/28/2023 1:54 PM

## 2023-07-28 NOTE — Discharge Summary (Signed)
Physician Discharge Summary   Patient: Kimberly Adkins MRN: [id number] DOB: 1956-01-21  Admit date:     07/21/2023  Discharge date: 07/29/23  Discharge Physician: Kendell Bane   PCP: [name], NP    To transfer to [facility] to the care of GYN oncologist  Dr. Russ Halo    Patient was seen and examined stable no acute distress .  No further changes to this discharge summary    Recommendations at discharge:   Follow-up GYN oncologist  Discharge Diagnoses: Principal Problem:   Hypokalemia Active Problems:   GERD   Osteoarthritis   COPD (chronic obstructive pulmonary disease) (HCC)   Hypertension   Dyslipidemia   Benzodiazepine dependence (HCC)   Alcohol use disorder   MDD (major depressive disorder), recurrent episode, mild (HCC)  Resolved Problems:   * No resolved hospital problems. *  Hospital Course: Kimberly Adkins is a 67 y.o. female with medical history significant for depression/anxiety, prior alcohol use, tobacco abuse, COPD, GERD, dyslipidemia, hypertension, osteoarthritis, and "ovary problem" who states that she has had generalized weakness over the last several days which has led to over 8 falls.  She denies any new pain or injury.  She states she has not been taking her home potassium and does not state a reason why.  She has had an episode of nausea and vomiting as well as diarrhea.  She was also noted to have some mild confusion and states that she usually has some mild abdominal distention.   ED Course: Vital signs stable and patient afebrile.  Potassium noted to be 2.4 and she has been started on repletion.  She has also been started on some IV fluid in the ED.  Hemoglobin 9.5.     Generalized weakness secondary to severe hypokalemia Slowly improving  Nausea vomiting/diarrhea improved  -Poor appetite, poor p.o. 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 Despina Hidden following and in discussion with Dr. Alvester Morin and  Dr. Tucker-recommended follow-up as an outpatient for tissue biopsy and treatment initiation   07/26/2023: Discussed the case in detail with Dr. Despina Hidden -He contacted patient family   (sister MS Logan) explained findings plan of care to them in detail-initial plan is for patient to follow-up at [facility]YN oncologist at [facility]  with Dr. Pricilla Holm and Thompson Grayer on October 18 at 11 AM -it appears that family is reluctant to follow-up with that recommendation Request of Dr. Despina Hidden to reach out to physician at [facility] and possible transfer this Monday, 07/28/2023     -07/28/23: Pending follow-up and update from Dr. Despina Hidden discussed the case with GYN oncologist Dr. Alessandra Grout at [facility]-I have accepted the patient for transfer     Anemia -anemia of chronic disease -No overt active bleeding, patient did report to GYN of some episodic vaginal spotting and bleeding -Monitoring H&H closely  Latest Ref Rng & Units 07/28/2023    4:14 AM 07/27/2023    4:35 AM 07/26/2023    5:00 AM  CBC  WBC 4.0 - 10.5 K/uL 19.1  17.5  16.0   Hemoglobin 12.0 - 15.0 g/dL 8.3  8.4  8.0   Hematocrit 36.0 - 46.0 % 27.7  28.8  26.4   Platelets 150 - 400 K/uL 634  645  624           Leukocytosis /possible UTI WBC 16.4>> 16.0,>>  19.1  -Afebrile, no other signs of infection -CXR - clear  -UA: Many bacteria, WBC 6-10, negative for nitrite >> will follow-up with cultures -Continue empiric IV antibiotics of Rocephin>> broadening antibiotics to Zosyn       Depression/anxiety Remains stable -Some withdrawal, no signs of anxiety -Continue Xanax and Prozac   COPD -Remains stable -DuoNebs as needed -Continue Dulera   Dyslipidemia -Continue atorvastatin   Hypertension -Mildly hypertensive otherwise stable -Treating amlodipine   Tobacco abuse -Smokes 1 pack/day, counseled on cessation     History of alcohol abuse No signs of withdrawal -Monitoring closely    Code Status: Full   Disposition Plan: Dr. Despina Hidden discussed the case with GYN oncologist Dr. Alessandra Grout at [facility]-I have accepted the patient for transfer  Consults called: Gynecology Dr. Despina Hidden    Family Communication:  Extensive discussion with 2 sisters at bedside  Requesting full scope of care, requesting an possible biopsy and treatment plan for oncology    One sister strongly believes the patient does not have cancer.     -Advance care planning has been discussed.  Family and patient requesting full scope of care    Procedures performed: CT abdomen pelvis Disposition:  [facility] To the care of GYN oncologist  Dr. Alessandra Grout   diet recommendation:  Discharge Diet Orders (From admission, onward)     Start     Ordered   07/28/23 0000  Diet - low sodium heart healthy        07/28/23 1506           Regular diet DISCHARGE MEDICATION: Allergies as of 07/29/2023       Reactions   Hydromorphone Itching        Medication List     TAKE these medications    ALPRAZolam 1 MG tablet Commonly known as: XANAX Take 1 tablet (1 mg total) by mouth 3 (three) times daily.   amLODipine 5 MG tablet Commonly known as: NORVASC Take 1 tablet (5 mg total) by mouth daily.   atorvastatin 40 MG  tablet Commonly known as: LIPITOR Take 1 tablet (40 mg total) by mouth daily.   FLUoxetine 20 MG capsule Commonly known as: PROZAC Take 3 capsules (60 mg total) by mouth daily.   fluticasone-salmeterol 100-50 MCG/ACT Aepb Commonly known as: ADVAIR Inhale 1 puff into the lungs 2 (two) times daily.   ibuprofen 200 MG tablet Commonly known as: ADVIL Take 600 mg by mouth in the morning and at bedtime.   nicotine 21 mg/24hr patch Commonly known as: NICODERM CQ - dosed in mg/24 hours Place 1 patch (21 mg total) onto the skin daily.   piperacillin-tazobactam 3.375 GM/50ML IVPB Commonly known as: ZOSYN Inject 50 mLs (3.375 g total) into the vein every 8 (eight) hours.   potassium chloride SA 20 MEQ tablet Commonly known as: KLOR-CON M Take 1 tablet (20 mEq total) by mouth daily.        Discharge Exam: [employer]  07/21/23 1906 07/23/23 0400  Weight: 80.7 kg 72.7 kg          General:  AAO x 3,  cooperative, no distress;   HEENT:  Normocephalic, PERRL, otherwise with in Normal limits   Neuro:  CNII-XII intact. , normal motor and sensation, reflexes intact   Lungs:   Clear to auscultation BL, Respirations unlabored,  No wheezes / crackles  Cardio:    S1/S2, RRR, No murmure, No Rubs or Gallops   Abdomen:  Distended abdomen-soft, non-tender, bowel sounds active all four quadrants, no guarding or peritoneal signs.  Muscular  skeletal:  Limited exam -global generalized weaknesses - in bed, able to move all 4 extremities,   2+ pulses,  symmetric, No pitting edema  Skin:  Dry, warm to touch, negative for any Rashes,  Wounds: Please see nursing documentation             Condition at discharge: fair  The results of significant diagnostics from this hospitalization (including imaging, microbiology, ancillary and laboratory) are listed below for reference.   Imaging Studies: DG CHEST PORT 1 VIEW  Result Date: 07/25/2023 CLINICAL DATA:  Shortness of breath EXAM:  PORTABLE CHEST 1 VIEW COMPARISON:  Chest x-ray 07/21/2023.  Chest CT 07/21/2023. FINDINGS: Small nodular densities in the lung apices are grossly unchanged. There is no focal lung infiltrate, pleural effusion or pneumothorax. Cardiomediastinal silhouette is within normal limits. No acute fractures are seen. IMPRESSION: 1. No active disease. 2. Small nodular densities in the lung apices are grossly unchanged. Electronically Signed   By: Darliss Cheney M.D.   On: 07/25/2023 17:26   CT CHEST ABDOMEN PELVIS WO CONTRAST  Result Date: 07/21/2023 CLINICAL DATA:  Unintentional weight loss. Weakness and altered mental status. EXAM: CT CHEST, ABDOMEN AND PELVIS WITHOUT CONTRAST TECHNIQUE: Multidetector CT imaging of the chest, abdomen and pelvis was performed following the standard protocol without IV contrast. RADIATION DOSE REDUCTION: This exam was performed according to the departmental dose-optimization program which includes automated exposure control, adjustment of the mA and/or kV according to patient size and/or use of iterative reconstruction technique. COMPARISON:  Chest radiograph dated 07/21/2023. FINDINGS: Evaluation of this exam is limited in the absence of intravenous contrast. CT CHEST FINDINGS Cardiovascular: There is no cardiomegaly or pericardial effusion. Mild atherosclerotic calcification of the thoracic aorta. The central pulmonary arteries are grossly unremarkable on this noncontrast CT. Mediastinum/Nodes: No hilar or mediastinal adenopathy. The esophagus is grossly unremarkable. No mediastinal fluid collection. Lungs/Pleura: Small left pleural effusion with minimal compressive atelectasis of the left lower lobe. Several scattered pulmonary nodules measure up to 9 mm consistent with metastatic disease. There is no pneumothorax. The central airways are patent. Musculoskeletal: No acute osseous pathology. CT ABDOMEN PELVIS FINDINGS No intra-abdominal free air.  Small ascites. Hepatobiliary: Ill-defined  hepatic hypodense lesions consistent with metastatic disease. No ductal dilatation. No calcified gallstone. Pancreas: The pancreas is unremarkable. Spleen: Normal in size without focal abnormality. Adrenals/Urinary Tract: The adrenal glands are unremarkable. There is mild left hydronephrosis. Gradual transition at the ureteropelvic junction. No stone. There is no hydronephrosis or nephrolithiasis on the right. The urinary bladder is minimally distended. Stomach/Bowel: Evaluation of the bowel is limited in the absence of oral contrast. There is no bowel obstruction. Vascular/Lymphatic: Moderate aortoiliac atherosclerotic disease. The IVC is unremarkable. No portal venous gas. Mildly enlarged aortocaval lymph node measures 14 mm. Reproductive: The uterus is poorly visualized. There appears to be an ill-defined hypodense mass in the lower uterus/cervical region measuring approximately 6.7 x  6.7 cm concerning for malignancy. Other: There is extensive omental and mesenteric nodularity consistent with implants. Musculoskeletal: Degenerative changes of the lower lumbar spine. No acute osseous pathology. IMPRESSION: 1. 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 Multidisciplinary consult is advised. 2. Small malignant left pleural effusion and small ascites. 3. Mild left hydronephrosis. 4.  Aortic Atherosclerosis (ICD10-I70.0). Electronically Signed   By: Elgie Collard M.D.   On: 07/21/2023 22:53   CT HEAD WO CONTRAST  Result Date: 07/21/2023 CLINICAL DATA:  Head trauma, minor (Age >= 65y); Neck trauma (Age >= 65y) Per EMS pt drinks every night and has been drinking tonight. Pt states weakness and AMS. Per EMS pt also smells of UTI. EXAM: CT HEAD WITHOUT CONTRAST CT CERVICAL SPINE WITHOUT CONTRAST TECHNIQUE: Multidetector CT imaging of the head and cervical spine was performed following the standard protocol without intravenous contrast.  Multiplanar CT image reconstructions of the cervical spine were also generated. RADIATION DOSE REDUCTION: This exam was performed according to the departmental dose-optimization program which includes automated exposure control, adjustment of the mA and/or kV according to patient size and/or use of iterative reconstruction technique. COMPARISON:  None Available. FINDINGS: CT HEAD FINDINGS Brain: Cerebral ventricle sizes are concordant with the degree of cerebral volume loss. Patchy and confluent areas of decreased attenuation are noted throughout the deep and periventricular white matter of the cerebral hemispheres bilaterally, compatible with chronic microvascular ischemic disease. Left occipital lobe encephalomalacia. No evidence of large-territorial acute infarction. No parenchymal hemorrhage. No mass lesion. No extra-axial collection. No mass effect or midline shift. No hydrocephalus. Basilar cisterns are patent. Vascular: No hyperdense vessel. Skull: No acute fracture or focal lesion. Sinuses/Orbits: Paranasal sinuses and mastoid air cells are clear. The orbits are unremarkable. Other: None. CT CERVICAL SPINE FINDINGS Alignment: Reversal of normal cervical lordosis likely due to positioning and degenerative changes. Skull base and vertebrae: Mild to moderate degenerative changes spine most prominent at the C5 through C7 levels. No associated severe osseous neural foraminal or central canal stenosis. No acute fracture. No aggressive appearing focal osseous lesion or focal pathologic process. Soft tissues and spinal canal: No prevertebral fluid or swelling. No visible canal hematoma. Upper chest: Subpleural 0.7 cm left upper lobe pulmonary nodule. Please see separately dictated CT chest 07/21/2023. Other: Atherosclerotic plaque of the carotid arteries within the neck. IMPRESSION: 1. No acute intracranial abnormality. 2. No acute displaced fracture or traumatic listhesis of the cervical spine. 3. Subpleural 0.7 cm  left upper lobe pulmonary nodule. Please see separately dictated CT chest 07/21/2023. Electronically Signed   By: Tish Frederickson M.D.   On: 07/21/2023 22:46   CT CERVICAL SPINE WO CONTRAST  Result Date: 07/21/2023 CLINICAL DATA:  Head trauma, minor (Age >= 65y); Neck trauma (Age >= 65y) Per EMS pt drinks every night and has been drinking tonight. Pt states weakness and AMS. Per EMS pt also smells of UTI. EXAM: CT HEAD WITHOUT CONTRAST CT CERVICAL SPINE WITHOUT CONTRAST TECHNIQUE: Multidetector CT imaging of the head and cervical spine was performed following the standard protocol without intravenous contrast. Multiplanar CT image reconstructions of the cervical spine were also generated. RADIATION DOSE REDUCTION: This exam was performed according to the departmental dose-optimization program which includes automated exposure control, adjustment of the mA and/or kV according to patient size and/or use of iterative reconstruction technique. COMPARISON:  None Available. FINDINGS: CT HEAD FINDINGS Brain: Cerebral ventricle sizes are concordant with the degree of cerebral volume loss. Patchy and  confluent areas of decreased attenuation are noted throughout the deep and periventricular white matter of the cerebral hemispheres bilaterally, compatible with chronic microvascular ischemic disease. Left occipital lobe encephalomalacia. No evidence of large-territorial acute infarction. No parenchymal hemorrhage. No mass lesion. No extra-axial collection. No mass effect or midline shift. No hydrocephalus. Basilar cisterns are patent. Vascular: No hyperdense vessel. Skull: No acute fracture or focal lesion. Sinuses/Orbits: Paranasal sinuses and mastoid air cells are clear. The orbits are unremarkable. Other: None. CT CERVICAL SPINE FINDINGS Alignment: Reversal of normal cervical lordosis likely due to positioning and degenerative changes. Skull base and vertebrae: Mild to moderate degenerative changes spine most prominent  at the C5 through C7 levels. No associated severe osseous neural foraminal or central canal stenosis. No acute fracture. No aggressive appearing focal osseous lesion or focal pathologic process. Soft tissues and spinal canal: No prevertebral fluid or swelling. No visible canal hematoma. Upper chest: Subpleural 0.7 cm left upper lobe pulmonary nodule. Please see separately dictated CT chest 07/21/2023. Other: Atherosclerotic plaque of the carotid arteries within the neck. IMPRESSION: 1. No acute intracranial abnormality. 2. No acute displaced fracture or traumatic listhesis of the cervical spine. 3. Subpleural 0.7 cm left upper lobe pulmonary nodule. Please see separately dictated CT chest 07/21/2023. Electronically Signed   By: Tish Frederickson M.D.   On: 07/21/2023 22:46   DG Chest Port 1 View  Result Date: 07/21/2023 CLINICAL DATA:  Fall, weakness EXAM: PORTABLE CHEST 1 VIEW COMPARISON:  06/03/2023 FINDINGS: The heart size and mediastinal contours are within normal limits. Both lungs are clear. Unchanged eventration of the right hemidiaphragm. The visualized skeletal structures are unremarkable. IMPRESSION: No acute abnormality of the lungs in AP portable projection. Electronically Signed   By: Jearld Lesch M.D.   On: 07/21/2023 20:35    Microbiology: Results for orders placed or performed during the hospital encounter of 07/21/23  MRSA Next Gen by PCR, Nasal     Status: Abnormal   Collection Time: 07/22/23  8:25 PM   Specimen: Nasal Mucosa; Nasal Swab  Result Value Ref Range Status   MRSA by PCR Next Gen DETECTED (A) NOT DETECTED Final    Comment: RESULT CALLED TO, READ BACK BY AND VERIFIED WITH: Shaune Leeks [id number], VIRAY,J (NOTE) The GeneXpert MRSA Assay (FDA approved for NASAL specimens only), is one component of a comprehensive MRSA colonization surveillance program. It is not intended to diagnose MRSA infection nor to guide or monitor treatment for MRSA infections. Test performance  is not FDA approved in patients less than 82 years old. Performed at [facility], [address], [place], [place] [address]     Labs: CBC: Recent Labs  Lab 07/24/23 0504 07/25/23 0449 07/26/23 0500 07/27/23 0435 07/28/23 0414  WBC 14.7* 16.4* 16.0* 17.5* 19.1*  HGB 8.2* 8.4* 8.0* 8.4* 8.3*  HCT 25.6* 27.0* 26.4* 28.8* 27.7*  MCV 76.2* 77.1* 78.3* 80.4 80.8  PLT 657* 701* 624* 645* 634*   Basic Metabolic Panel: Recent Labs  Lab 07/23/23 0459 07/24/23 0504 07/25/23 0449 07/26/23 0500 07/26/23 0802 07/27/23 0435 07/28/23 0414  NA 133* 132* 133* 136  --  136 137  K 2.8* 2.8* 3.1* 2.9*  --  4.1 3.3*  CL 97* 100 100 102  --  103 105  CO2 24 22 25 26   --  25 22  GLUCOSE 110* 116* 113* 111*  --  110* 100*  BUN 6* 6* 7* 10  --  11 11  CREATININE 0.83 0.81 0.86 0.97  --  1.06* 0.98  CALCIUM 10.0 9.7 10.0 10.6*  --  10.8* 10.5*  MG 2.0  --   --   --  2.2  --   --    Liver Function Tests: Recent Labs  Lab 07/23/23 0459  AST 25  ALT 14  ALKPHOS 89  BILITOT 0.8  PROT 6.5  ALBUMIN 2.6*   CBG: No results for input(s): "GLUCAP" in the last 168 hours.   Discharge time spent: greater than 30 minutes.  Signed: Kendell Bane, MD [facility] 07/29/2023

## 2023-07-29 DIAGNOSIS — E876 Hypokalemia: Secondary | ICD-10-CM | POA: Diagnosis not present

## 2023-07-29 LAB — URINE CULTURE: Culture: NO GROWTH

## 2023-07-29 MED ORDER — POTASSIUM CHLORIDE CRYS ER 20 MEQ PO TBCR
20.0000 meq | EXTENDED_RELEASE_TABLET | Freq: Every day | ORAL | Status: DC
  Administered 2023-07-29: 20 meq via ORAL
  Filled 2023-07-29: qty 1

## 2023-07-29 NOTE — Progress Notes (Signed)
PROGRESS NOTE    Patient: Kimberly Adkins                            PCP: [name], NP                    DOB: 1955-11-19            DOA: 07/21/2023 [id number]:[id number]             DOS: 07/29/2023, 11:26 AM   LOS: 8 days   Date of Service: The patient was seen and examined on 07/29/2023  Subjective:   The patient was seen and examined this morning, stable no acute distress  Pending discharge to [facility]  Discharge summary completed!  Brief Narrative:   Kimberly Adkins is a 67 y.o. female with medical history significant for depression/anxiety, prior alcohol use, tobacco abuse, COPD, GERD, dyslipidemia, hypertension, osteoarthritis, and "ovary problem" who states that she has had generalized weakness over the last several days which has led to over 8 falls.  She denies any new pain or injury.  She states she has not been taking her home potassium and does not state a reason why.  She has had an episode of nausea and vomiting as well as diarrhea.  She was also noted to have some mild confusion and states that she usually has some mild abdominal distention.   ED Course: Vital signs stable and patient afebrile.  Potassium noted to be 2.4 and she has been started on repletion.  She has also been started on some IV fluid in the ED.  Hemoglobin 9.5.    Assessment & Plan:   Principal Problem:   Hypokalemia Active Problems:   GERD   Osteoarthritis   COPD (chronic obstructive pulmonary disease) (HCC)   Hypertension   Dyslipidemia   Benzodiazepine dependence (HCC)   Alcohol use disorder   MDD (major depressive disorder), recurrent episode, mild (HCC)   Generalized weakness secondary to severe hypokalemia Improving, improved nausea vomiting  -Poor appetite, poor p.o. 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 Despina Hidden following and in discussion with Dr. Alvester Morin and  Dr. Tucker-recommended follow-up as an outpatient for tissue biopsy and treatment initiation  07/26/2023: Discussed the case in detail with Dr. Despina Hidden -He contacted patient family   (sister MS Logan) explained findings plan of care to them in detail-initial plan is for patient to follow-up at [facility] GYN oncologist at [facility]  with Dr. Pricilla Holm and Thompson Grayer on October 18 at 11 AM -it appears that family is reluctant to follow-up with that recommendation Request of Dr. Despina Hidden to reach out to physician at [facility] and possible transfer this Monday, 07/28/2023   -07/28/23: Dr. Despina Hidden discussed  her case with [facility] GYN oncologist Dr. Russ Halo etc. patient-pending   Anemia -anemia of chronic disease -No overt active bleeding, patient did report to GYN of some episodic vaginal spotting and bleeding -Monitoring H&H closely     Leukocytosis /possible UTI WBC 16.4>> 16.0,>> 19.1  -Afebrile, no other signs of infection -CXR - clear  -UA: Many bacteria, WBC 6-10, negative for nitrite >> will follow-up with cultures -Continue empiric IV antibiotics of Rocephin>> broadening antibiotics to Zosyn     Depression/anxiety Remains stable -Some withdrawal, no signs  of anxiety -Continue Xanax and Prozac   COPD -Remains stable -DuoNebs as needed -Continue Dulera   Dyslipidemia -Continue atorvastatin   Hypertension -Mildly hypertensive otherwise stable -Treating amlodipine   Tobacco abuse -Smokes 1 pack/day, counseled on cessation    History of alcohol abuse No signs of withdrawal -Monitoring closely   DVT prophylaxis: Lovenox Code Status: Full  Disposition Plan: Admit for treatment of hypokalemia and further evaluation of gynecologic cancer with metastatic disease  Consults called: Gynecology Dr. Despina Hidden  ------------------------------------------------------------------------------------------------------------------------- Nutritional status:  The patient's BMI is: Body mass index is 27.51 kg/m. I agree with the assessment and plan as outlined -------------------------------------------------------------------------------------------------------------------------  Family Communication:  Extensive discussion with 2 sisters at bedside yesterday 07/25/2023 Requesting full scope of care, requesting an possible biopsy and treatment plan for oncology   One sister strongly believes the patient does not have cancer.    -Advance care planning has been discussed.  Family and patient requesting full scope of care Admission status:   Status is: Inpatient Remains inpatient appropriate because: Needing continuous IV fluids, IV antiemetics, GYN evaluation for new diagnosis of cancer with metastases   Disposition: From  - home             Planning for discharge in 2-4 days - ? SNF  Procedures:   No admission procedures for hospital encounter.   Antimicrobials:  Anti-infectives (From admission, onward)    Start     Dose/Rate Route Frequency Ordered Stop   07/28/23 2200  piperacillin-tazobactam (ZOSYN) IVPB 3.375 g        3.375 g 12.5 mL/hr over 240 Minutes Intravenous Every 8 hours 07/28/23 1352     07/28/23 1415   piperacillin-tazobactam (ZOSYN) IVPB 3.375 g        3.375 g 100 mL/hr over 30 Minutes Intravenous  Once 07/28/23 1322 07/28/23 1530   07/27/23 1200  cefTRIAXone (ROCEPHIN) 2 g in sodium chloride 0.9 % 100 mL IVPB  Status:  Discontinued        2 g 200 mL/hr over 30 Minutes Intravenous Every 24 hours 07/27/23 0819 07/28/23 1249   07/26/23 1430  cefTRIAXone (ROCEPHIN) 1 g in sodium chloride 0.9 % 100 mL IVPB  Status:  Discontinued        1 g 200 mL/hr over 30 Minutes Intravenous Every 24 hours 07/26/23 1254 07/27/23 0819   07/26/23 1345  ciprofloxacin (CIPRO) tablet 500 mg  Status:  Discontinued        500 mg Oral 2 times daily 07/26/23 1254 07/26/23 1344        Medication:   amLODipine  10 mg Oral Daily   FLUoxetine  60 mg Oral Daily   folic acid  1 mg Oral Daily   mometasone-formoterol  2 puff Inhalation BID   nicotine  21 mg Transdermal Daily   ondansetron (ZOFRAN) IV  4 mg Intravenous Q6H   senna-docusate  1 tablet Oral QHS   thiamine  100 mg  Oral Daily    acetaminophen **OR** acetaminophen, ALPRAZolam, ipratropium-albuterol   Objective:   Vitals:   07/28/23 1008 07/28/23 1334 07/28/23 2112 07/29/23 0545  BP: 128/87 (!) 149/77 (!) 144/93 (!) 147/76  Pulse:  (!) 106 98 97  Resp:  16 18 18   Temp:  98.6 F (37 C) 98.4 F (36.9 C) 98.2 F (36.8 C)  TempSrc:  Axillary Axillary Axillary  SpO2:  93% 94% 91%  Weight:      Height:        Intake/Output Summary (Last 24 hours) at 07/29/2023 1126 Last data filed at 07/29/2023 0900 Gross per 24 hour  Intake 524.66 ml  Output --  Net 524.66 ml    Filed Weights   07/21/23 1906 07/23/23 0400  Weight: 80.7 kg 72.7 kg     Physical examination:     General:  AAO x 3,  cooperative, no distress;   HEENT:  Normocephalic, PERRL, otherwise with in Normal limits   Neuro:  CNII-XII intact. , normal motor and sensation, reflexes intact   Lungs:   Clear to auscultation BL, Respirations unlabored,  No wheezes / crackles   Cardio:    S1/S2, RRR, No murmure, No Rubs or Gallops   Abdomen:  Distended abdomen -soft, non-tender, bowel sounds active all four quadrants, no guarding or peritoneal signs.  Muscular  skeletal:  Limited exam -global generalized weaknesses - in bed, able to move all 4 extremities,   2+ pulses,  symmetric, No pitting edema  Skin:  Dry, warm to touch, negative for any Rashes,  Wounds: Please see nursing documentation             -----------------------------------------------------------------------------------------------------------------------    LABs:     Latest Ref Rng & Units 07/28/2023    4:14 AM 07/27/2023    4:35 AM 07/26/2023    5:00 AM  CBC  WBC 4.0 - 10.5 K/uL 19.1  17.5  16.0   Hemoglobin 12.0 - 15.0 g/dL 8.3  8.4  8.0   Hematocrit 36.0 - 46.0 % 27.7  28.8  26.4   Platelets 150 - 400 K/uL 634  645  624       Latest Ref Rng & Units 07/28/2023    4:14 AM 07/27/2023    4:35 AM 07/26/2023    5:00 AM  CMP  Glucose 70 - 99 mg/dL 308  657  846   BUN 8 - 23 mg/dL 11  11  10    Creatinine 0.44 - 1.00 mg/dL 9.62  9.52  8.41   Sodium 135 - 145 mmol/L 137  136  136   Potassium 3.5 - 5.1 mmol/L 3.3  4.1  2.9   Chloride 98 - 111 mmol/L 105  103  102   CO2 22 - 32 mmol/L 22  25  26    Calcium 8.9 - 10.3 mg/dL 32.4  40.1  02.7        Micro Results Recent Results (from the past 240 hour(s))  MRSA Next Gen by PCR, Nasal     Status: Abnormal   Collection Time: 07/22/23  8:25 PM   Specimen: Nasal Mucosa; Nasal Swab  Result Value Ref Range Status   MRSA by PCR Next Gen DETECTED (A) NOT DETECTED Final    Comment: RESULT CALLED TO, READ BACK BY AND VERIFIED WITH: Shaune Leeks [phone], VIRAY,J (NOTE) The GeneXpert MRSA Assay (FDA approved for NASAL specimens only), is one component of a comprehensive MRSA colonization surveillance program. It is not intended to diagnose MRSA  infection nor to guide or monitor treatment for MRSA infections. Test performance  is not FDA approved in patients less than 21 years old. Performed at [facility] [place], [address] [place], [place] [address]     Radiology Reports No results found.  SIGNED: Kendell Bane, MD, FHM. FAAFP. [facility] - [facility] time spent - 55 min.  In seeing, evaluating and examining the patient. Reviewing medical records, labs, drawn plan of care. [facility],  Pager (please use [email or web address] to page/ text) Please use Epic Secure Chat for non-urgent communication (7AM-7PM)  If 7PM-7AM, please contact night-coverage [email or web address], 07/29/2023, 11:26 AM

## 2023-07-29 NOTE — Progress Notes (Signed)
   07/29/23 1115  Spiritual Encounters  Type of Visit Initial  Care provided to: Patient  Referral source Other (comment) (Spiritual Consult)  Reason for visit Routine spiritual support  OnCall Visit No   Chaplain responded to a spiritual consult for prayer. I visited with the patient, Kimberly Adkins. She was restless and in motion the entire time I was with her. [name] said she would like to pray but I asked what she would like to pray for she did not respond. When I asked if she would like to pray for healing she responded that she is going to go to church when she leaves. I replied that I hoped her church community surrrounds her with love and support. Her slight smile said a lot. She did not say anything else. I prayed for her strength,healing and safe return home.   [facility]  [phone]

## 2023-07-30 DIAGNOSIS — F1320 Sedative, hypnotic or anxiolytic dependence, uncomplicated: Secondary | ICD-10-CM | POA: Diagnosis not present

## 2023-07-30 DIAGNOSIS — F411 Generalized anxiety disorder: Secondary | ICD-10-CM | POA: Diagnosis not present

## 2023-07-30 DIAGNOSIS — M6281 Muscle weakness (generalized): Secondary | ICD-10-CM | POA: Diagnosis not present

## 2023-07-30 DIAGNOSIS — I10 Essential (primary) hypertension: Secondary | ICD-10-CM | POA: Diagnosis not present

## 2023-07-30 DIAGNOSIS — J449 Chronic obstructive pulmonary disease, unspecified: Secondary | ICD-10-CM | POA: Diagnosis not present

## 2023-07-30 DIAGNOSIS — Z171 Estrogen receptor negative status [ER-]: Secondary | ICD-10-CM | POA: Diagnosis not present

## 2023-07-30 DIAGNOSIS — R262 Difficulty in walking, not elsewhere classified: Secondary | ICD-10-CM | POA: Diagnosis not present

## 2023-07-30 DIAGNOSIS — D638 Anemia in other chronic diseases classified elsewhere: Secondary | ICD-10-CM | POA: Diagnosis not present

## 2023-07-30 DIAGNOSIS — R4182 Altered mental status, unspecified: Secondary | ICD-10-CM | POA: Diagnosis not present

## 2023-07-30 DIAGNOSIS — E785 Hyperlipidemia, unspecified: Secondary | ICD-10-CM | POA: Diagnosis not present

## 2023-07-30 DIAGNOSIS — F32A Depression, unspecified: Secondary | ICD-10-CM | POA: Diagnosis not present

## 2023-07-30 DIAGNOSIS — C7801 Secondary malignant neoplasm of right lung: Secondary | ICD-10-CM | POA: Diagnosis not present

## 2023-07-30 DIAGNOSIS — C786 Secondary malignant neoplasm of retroperitoneum and peritoneum: Secondary | ICD-10-CM | POA: Diagnosis not present

## 2023-07-30 DIAGNOSIS — R531 Weakness: Secondary | ICD-10-CM | POA: Diagnosis not present

## 2023-07-30 DIAGNOSIS — C7802 Secondary malignant neoplasm of left lung: Secondary | ICD-10-CM | POA: Diagnosis not present

## 2023-07-30 DIAGNOSIS — C787 Secondary malignant neoplasm of liver and intrahepatic bile duct: Secondary | ICD-10-CM | POA: Diagnosis not present

## 2023-07-30 DIAGNOSIS — C539 Malignant neoplasm of cervix uteri, unspecified: Secondary | ICD-10-CM | POA: Diagnosis not present

## 2023-07-30 NOTE — Progress Notes (Signed)
Report given to [facility] transport, en route to receive patient. MD aware of patient transport.

## 2023-07-30 NOTE — Progress Notes (Signed)
Patient transferred via [facility] transport services in stable condition. Offered to contact patient's family and make them aware of transfer, patient declined.

## 2023-08-05 ENCOUNTER — Other Ambulatory Visit: Payer: Self-pay

## 2023-08-05 DIAGNOSIS — F411 Generalized anxiety disorder: Secondary | ICD-10-CM | POA: Diagnosis not present

## 2023-08-05 DIAGNOSIS — C786 Secondary malignant neoplasm of retroperitoneum and peritoneum: Secondary | ICD-10-CM | POA: Diagnosis present

## 2023-08-05 DIAGNOSIS — J449 Chronic obstructive pulmonary disease, unspecified: Secondary | ICD-10-CM | POA: Diagnosis present

## 2023-08-05 DIAGNOSIS — E861 Hypovolemia: Secondary | ICD-10-CM | POA: Diagnosis present

## 2023-08-05 DIAGNOSIS — C7800 Secondary malignant neoplasm of unspecified lung: Secondary | ICD-10-CM | POA: Diagnosis not present

## 2023-08-05 DIAGNOSIS — R262 Difficulty in walking, not elsewhere classified: Secondary | ICD-10-CM | POA: Diagnosis not present

## 2023-08-05 DIAGNOSIS — I21A1 Myocardial infarction type 2: Secondary | ICD-10-CM | POA: Diagnosis present

## 2023-08-05 DIAGNOSIS — C787 Secondary malignant neoplasm of liver and intrahepatic bile duct: Secondary | ICD-10-CM | POA: Diagnosis present

## 2023-08-05 DIAGNOSIS — R531 Weakness: Secondary | ICD-10-CM | POA: Diagnosis not present

## 2023-08-05 DIAGNOSIS — R0689 Other abnormalities of breathing: Secondary | ICD-10-CM | POA: Diagnosis not present

## 2023-08-05 DIAGNOSIS — E870 Hyperosmolality and hypernatremia: Secondary | ICD-10-CM | POA: Diagnosis present

## 2023-08-05 DIAGNOSIS — I10 Essential (primary) hypertension: Secondary | ICD-10-CM | POA: Diagnosis present

## 2023-08-05 DIAGNOSIS — G9340 Encephalopathy, unspecified: Secondary | ICD-10-CM | POA: Diagnosis not present

## 2023-08-05 DIAGNOSIS — J9601 Acute respiratory failure with hypoxia: Secondary | ICD-10-CM | POA: Diagnosis not present

## 2023-08-05 DIAGNOSIS — R464 Slowness and poor responsiveness: Secondary | ICD-10-CM | POA: Diagnosis not present

## 2023-08-05 DIAGNOSIS — R6521 Severe sepsis with septic shock: Secondary | ICD-10-CM | POA: Diagnosis present

## 2023-08-05 DIAGNOSIS — M6281 Muscle weakness (generalized): Secondary | ICD-10-CM | POA: Diagnosis not present

## 2023-08-05 DIAGNOSIS — C539 Malignant neoplasm of cervix uteri, unspecified: Secondary | ICD-10-CM | POA: Diagnosis present

## 2023-08-05 DIAGNOSIS — A419 Sepsis, unspecified organism: Secondary | ICD-10-CM | POA: Diagnosis present

## 2023-08-05 DIAGNOSIS — Z66 Do not resuscitate: Secondary | ICD-10-CM | POA: Diagnosis present

## 2023-08-05 DIAGNOSIS — I214 Non-ST elevation (NSTEMI) myocardial infarction: Secondary | ICD-10-CM | POA: Diagnosis not present

## 2023-08-05 DIAGNOSIS — C7802 Secondary malignant neoplasm of left lung: Secondary | ICD-10-CM | POA: Diagnosis present

## 2023-08-05 DIAGNOSIS — C7801 Secondary malignant neoplasm of right lung: Secondary | ICD-10-CM | POA: Diagnosis present

## 2023-08-05 DIAGNOSIS — F17210 Nicotine dependence, cigarettes, uncomplicated: Secondary | ICD-10-CM | POA: Diagnosis present

## 2023-08-05 DIAGNOSIS — R000 Tachycardia, unspecified: Secondary | ICD-10-CM | POA: Diagnosis not present

## 2023-08-05 DIAGNOSIS — I959 Hypotension, unspecified: Secondary | ICD-10-CM | POA: Diagnosis not present

## 2023-08-05 DIAGNOSIS — R6520 Severe sepsis without septic shock: Secondary | ICD-10-CM | POA: Diagnosis not present

## 2023-08-05 DIAGNOSIS — Z1152 Encounter for screening for COVID-19: Secondary | ICD-10-CM | POA: Diagnosis not present

## 2023-08-05 DIAGNOSIS — G9341 Metabolic encephalopathy: Secondary | ICD-10-CM | POA: Diagnosis present

## 2023-08-05 DIAGNOSIS — C801 Malignant (primary) neoplasm, unspecified: Secondary | ICD-10-CM | POA: Diagnosis not present

## 2023-08-05 DIAGNOSIS — R140 Abdominal distension (gaseous): Secondary | ICD-10-CM | POA: Diagnosis not present

## 2023-08-05 DIAGNOSIS — N179 Acute kidney failure, unspecified: Secondary | ICD-10-CM | POA: Diagnosis present

## 2023-08-05 DIAGNOSIS — E878 Other disorders of electrolyte and fluid balance, not elsewhere classified: Secondary | ICD-10-CM | POA: Diagnosis present

## 2023-08-05 DIAGNOSIS — D0220 Carcinoma in situ of unspecified bronchus and lung: Secondary | ICD-10-CM | POA: Diagnosis not present

## 2023-08-05 DIAGNOSIS — C7989 Secondary malignant neoplasm of other specified sites: Secondary | ICD-10-CM | POA: Diagnosis present

## 2023-08-05 DIAGNOSIS — Z9981 Dependence on supplemental oxygen: Secondary | ICD-10-CM | POA: Diagnosis not present

## 2023-08-05 DIAGNOSIS — J984 Other disorders of lung: Secondary | ICD-10-CM | POA: Diagnosis not present

## 2023-08-05 DIAGNOSIS — N390 Urinary tract infection, site not specified: Secondary | ICD-10-CM | POA: Diagnosis present

## 2023-08-05 DIAGNOSIS — E872 Acidosis, unspecified: Secondary | ICD-10-CM | POA: Diagnosis present

## 2023-08-05 DIAGNOSIS — R4182 Altered mental status, unspecified: Secondary | ICD-10-CM | POA: Diagnosis not present

## 2023-08-05 DIAGNOSIS — F32A Depression, unspecified: Secondary | ICD-10-CM | POA: Diagnosis present

## 2023-08-05 DIAGNOSIS — E785 Hyperlipidemia, unspecified: Secondary | ICD-10-CM | POA: Diagnosis present

## 2023-08-05 DIAGNOSIS — R579 Shock, unspecified: Secondary | ICD-10-CM | POA: Diagnosis not present

## 2023-08-05 MED ORDER — AMLODIPINE BESYLATE 5 MG PO TABS
10.0000 mg | ORAL_TABLET | Freq: Every day | ORAL | 0 refills | Status: DC
  Filled 2023-08-05: qty 60, 30d supply, fill #0

## 2023-08-05 MED ORDER — ONDANSETRON 4 MG PO TBDP
4.0000 mg | ORAL_TABLET | Freq: Four times a day (QID) | ORAL | 0 refills | Status: DC | PRN
  Filled 2023-08-05: qty 20, 5d supply, fill #0

## 2023-08-05 MED ORDER — SENNOSIDES-DOCUSATE SODIUM 8.6-50 MG PO TABS
1.0000 | ORAL_TABLET | Freq: Every day | ORAL | 0 refills | Status: DC
Start: 2023-08-05 — End: 2023-08-10

## 2023-08-05 MED ORDER — ACETAMINOPHEN 325 MG PO TABS: 650.0000 mg | ORAL_TABLET | Freq: Four times a day (QID) | ORAL | 0 refills | Status: DC | PRN

## 2023-08-06 DIAGNOSIS — F32A Depression, unspecified: Secondary | ICD-10-CM | POA: Diagnosis not present

## 2023-08-06 DIAGNOSIS — J449 Chronic obstructive pulmonary disease, unspecified: Secondary | ICD-10-CM | POA: Diagnosis not present

## 2023-08-06 DIAGNOSIS — I10 Essential (primary) hypertension: Secondary | ICD-10-CM | POA: Diagnosis not present

## 2023-08-06 DIAGNOSIS — E785 Hyperlipidemia, unspecified: Secondary | ICD-10-CM | POA: Diagnosis not present

## 2023-08-07 DIAGNOSIS — I10 Essential (primary) hypertension: Secondary | ICD-10-CM | POA: Diagnosis not present

## 2023-08-07 DIAGNOSIS — F32A Depression, unspecified: Secondary | ICD-10-CM | POA: Diagnosis not present

## 2023-08-07 DIAGNOSIS — C539 Malignant neoplasm of cervix uteri, unspecified: Secondary | ICD-10-CM | POA: Diagnosis not present

## 2023-08-07 DIAGNOSIS — E785 Hyperlipidemia, unspecified: Secondary | ICD-10-CM | POA: Diagnosis not present

## 2023-08-07 DIAGNOSIS — J449 Chronic obstructive pulmonary disease, unspecified: Secondary | ICD-10-CM | POA: Diagnosis not present

## 2023-08-07 DIAGNOSIS — F411 Generalized anxiety disorder: Secondary | ICD-10-CM | POA: Diagnosis not present

## 2023-08-08 ENCOUNTER — Telehealth: Payer: Self-pay | Admitting: Nurse Practitioner

## 2023-08-08 DIAGNOSIS — C539 Malignant neoplasm of cervix uteri, unspecified: Secondary | ICD-10-CM | POA: Diagnosis not present

## 2023-08-08 DIAGNOSIS — J449 Chronic obstructive pulmonary disease, unspecified: Secondary | ICD-10-CM | POA: Diagnosis not present

## 2023-08-08 DIAGNOSIS — F32A Depression, unspecified: Secondary | ICD-10-CM | POA: Diagnosis not present

## 2023-08-08 DIAGNOSIS — I10 Essential (primary) hypertension: Secondary | ICD-10-CM | POA: Diagnosis not present

## 2023-08-08 DIAGNOSIS — E785 Hyperlipidemia, unspecified: Secondary | ICD-10-CM | POA: Diagnosis not present

## 2023-08-08 DIAGNOSIS — F411 Generalized anxiety disorder: Secondary | ICD-10-CM | POA: Diagnosis not present

## 2023-08-08 NOTE — Telephone Encounter (Signed)
Copied from CRM [id number]. Topic: Appointment Scheduling - Scheduling Inquiry for Clinic >> Aug 08, 2023  8:44 AM Franchot Heidelberg wrote: Reason for CRM: Pt's sister Jamesetta So called requesting to speak to the clinic about the patient's health status >> Aug 08, 2023  9:02 AM Marlow Baars wrote: Alona Bene the sister called back wanting to make sure her provider knew how dire this situation is and just how sick her sister is. She is at [facility] currently but will be discharged to hospice. She just really wants to talk with the provider as soon as possible to update her about this. Please assist further.

## 2023-08-09 ENCOUNTER — Other Ambulatory Visit: Payer: Self-pay

## 2023-08-09 ENCOUNTER — Emergency Department (HOSPITAL_COMMUNITY): Payer: Medicare PPO

## 2023-08-09 ENCOUNTER — Inpatient Hospital Stay (HOSPITAL_COMMUNITY)
Admission: EM | Admit: 2023-08-09 | Discharge: 2023-08-15 | DRG: 871 | Disposition: E | Payer: Medicare PPO | Attending: Pulmonary Disease | Admitting: Pulmonary Disease

## 2023-08-09 ENCOUNTER — Encounter (HOSPITAL_COMMUNITY): Payer: Self-pay

## 2023-08-09 DIAGNOSIS — C786 Secondary malignant neoplasm of retroperitoneum and peritoneum: Secondary | ICD-10-CM | POA: Diagnosis present

## 2023-08-09 DIAGNOSIS — R6520 Severe sepsis without septic shock: Secondary | ICD-10-CM | POA: Diagnosis not present

## 2023-08-09 DIAGNOSIS — F17210 Nicotine dependence, cigarettes, uncomplicated: Secondary | ICD-10-CM | POA: Diagnosis present

## 2023-08-09 DIAGNOSIS — R579 Shock, unspecified: Secondary | ICD-10-CM | POA: Diagnosis not present

## 2023-08-09 DIAGNOSIS — R464 Slowness and poor responsiveness: Secondary | ICD-10-CM | POA: Diagnosis not present

## 2023-08-09 DIAGNOSIS — R140 Abdominal distension (gaseous): Secondary | ICD-10-CM | POA: Diagnosis not present

## 2023-08-09 DIAGNOSIS — G9341 Metabolic encephalopathy: Secondary | ICD-10-CM | POA: Diagnosis present

## 2023-08-09 DIAGNOSIS — K219 Gastro-esophageal reflux disease without esophagitis: Secondary | ICD-10-CM | POA: Diagnosis present

## 2023-08-09 DIAGNOSIS — R6521 Severe sepsis with septic shock: Secondary | ICD-10-CM | POA: Diagnosis present

## 2023-08-09 DIAGNOSIS — E878 Other disorders of electrolyte and fluid balance, not elsewhere classified: Secondary | ICD-10-CM | POA: Diagnosis present

## 2023-08-09 DIAGNOSIS — I21A1 Myocardial infarction type 2: Secondary | ICD-10-CM | POA: Diagnosis not present

## 2023-08-09 DIAGNOSIS — Z66 Do not resuscitate: Secondary | ICD-10-CM | POA: Diagnosis present

## 2023-08-09 DIAGNOSIS — E785 Hyperlipidemia, unspecified: Secondary | ICD-10-CM | POA: Diagnosis present

## 2023-08-09 DIAGNOSIS — R4182 Altered mental status, unspecified: Secondary | ICD-10-CM | POA: Diagnosis not present

## 2023-08-09 DIAGNOSIS — N179 Acute kidney failure, unspecified: Secondary | ICD-10-CM | POA: Diagnosis present

## 2023-08-09 DIAGNOSIS — E872 Acidosis, unspecified: Secondary | ICD-10-CM | POA: Diagnosis present

## 2023-08-09 DIAGNOSIS — G9340 Encephalopathy, unspecified: Secondary | ICD-10-CM

## 2023-08-09 DIAGNOSIS — I214 Non-ST elevation (NSTEMI) myocardial infarction: Secondary | ICD-10-CM

## 2023-08-09 DIAGNOSIS — J984 Other disorders of lung: Secondary | ICD-10-CM | POA: Diagnosis not present

## 2023-08-09 DIAGNOSIS — I959 Hypotension, unspecified: Secondary | ICD-10-CM | POA: Diagnosis not present

## 2023-08-09 DIAGNOSIS — J9601 Acute respiratory failure with hypoxia: Secondary | ICD-10-CM | POA: Diagnosis not present

## 2023-08-09 DIAGNOSIS — F4310 Post-traumatic stress disorder, unspecified: Secondary | ICD-10-CM | POA: Diagnosis present

## 2023-08-09 DIAGNOSIS — N390 Urinary tract infection, site not specified: Secondary | ICD-10-CM | POA: Diagnosis present

## 2023-08-09 DIAGNOSIS — Z9981 Dependence on supplemental oxygen: Secondary | ICD-10-CM | POA: Diagnosis not present

## 2023-08-09 DIAGNOSIS — C7802 Secondary malignant neoplasm of left lung: Secondary | ICD-10-CM | POA: Diagnosis present

## 2023-08-09 DIAGNOSIS — Z7951 Long term (current) use of inhaled steroids: Secondary | ICD-10-CM

## 2023-08-09 DIAGNOSIS — C7801 Secondary malignant neoplasm of right lung: Secondary | ICD-10-CM | POA: Diagnosis present

## 2023-08-09 DIAGNOSIS — Z803 Family history of malignant neoplasm of breast: Secondary | ICD-10-CM

## 2023-08-09 DIAGNOSIS — F32A Depression, unspecified: Secondary | ICD-10-CM | POA: Diagnosis present

## 2023-08-09 DIAGNOSIS — A419 Sepsis, unspecified organism: Secondary | ICD-10-CM | POA: Diagnosis present

## 2023-08-09 DIAGNOSIS — D0220 Carcinoma in situ of unspecified bronchus and lung: Secondary | ICD-10-CM | POA: Diagnosis not present

## 2023-08-09 DIAGNOSIS — Z79899 Other long term (current) drug therapy: Secondary | ICD-10-CM

## 2023-08-09 DIAGNOSIS — E861 Hypovolemia: Secondary | ICD-10-CM | POA: Diagnosis present

## 2023-08-09 DIAGNOSIS — J449 Chronic obstructive pulmonary disease, unspecified: Secondary | ICD-10-CM | POA: Diagnosis present

## 2023-08-09 DIAGNOSIS — I10 Essential (primary) hypertension: Secondary | ICD-10-CM | POA: Diagnosis not present

## 2023-08-09 DIAGNOSIS — C7989 Secondary malignant neoplasm of other specified sites: Secondary | ICD-10-CM | POA: Diagnosis present

## 2023-08-09 DIAGNOSIS — Z1152 Encounter for screening for COVID-19: Secondary | ICD-10-CM

## 2023-08-09 DIAGNOSIS — Z808 Family history of malignant neoplasm of other organs or systems: Secondary | ICD-10-CM

## 2023-08-09 DIAGNOSIS — C7800 Secondary malignant neoplasm of unspecified lung: Secondary | ICD-10-CM | POA: Diagnosis not present

## 2023-08-09 DIAGNOSIS — E870 Hyperosmolality and hypernatremia: Secondary | ICD-10-CM | POA: Diagnosis present

## 2023-08-09 DIAGNOSIS — C787 Secondary malignant neoplasm of liver and intrahepatic bile duct: Secondary | ICD-10-CM | POA: Diagnosis present

## 2023-08-09 DIAGNOSIS — R000 Tachycardia, unspecified: Secondary | ICD-10-CM | POA: Diagnosis not present

## 2023-08-09 DIAGNOSIS — Z8249 Family history of ischemic heart disease and other diseases of the circulatory system: Secondary | ICD-10-CM

## 2023-08-09 DIAGNOSIS — C539 Malignant neoplasm of cervix uteri, unspecified: Secondary | ICD-10-CM | POA: Diagnosis present

## 2023-08-09 DIAGNOSIS — F419 Anxiety disorder, unspecified: Secondary | ICD-10-CM | POA: Diagnosis present

## 2023-08-09 DIAGNOSIS — Z885 Allergy status to narcotic agent status: Secondary | ICD-10-CM

## 2023-08-09 DIAGNOSIS — C801 Malignant (primary) neoplasm, unspecified: Secondary | ICD-10-CM | POA: Diagnosis not present

## 2023-08-09 DIAGNOSIS — R0689 Other abnormalities of breathing: Secondary | ICD-10-CM | POA: Diagnosis not present

## 2023-08-09 HISTORY — DX: Nicotine dependence, unspecified, uncomplicated: F17.200

## 2023-08-09 HISTORY — DX: Alcohol abuse, uncomplicated: F10.10

## 2023-08-09 HISTORY — DX: Other long term (current) drug therapy: Z79.899

## 2023-08-09 HISTORY — DX: Secondary malignant neoplasm of unspecified site: C79.9

## 2023-08-09 LAB — I-STAT CHEM 8, ED
BUN: 54 mg/dL — ABNORMAL HIGH (ref 8–23)
Calcium, Ion: 1.78 mmol/L (ref 1.15–1.40)
Chloride: 118 mmol/L — ABNORMAL HIGH (ref 98–111)
Creatinine, Ser: 3.2 mg/dL — ABNORMAL HIGH (ref 0.44–1.00)
Glucose, Bld: 109 mg/dL — ABNORMAL HIGH (ref 70–99)
HCT: 22 % — ABNORMAL LOW (ref 36.0–46.0)
Hemoglobin: 7.5 g/dL — ABNORMAL LOW (ref 12.0–15.0)
Potassium: 4.8 mmol/L (ref 3.5–5.1)
Sodium: 148 mmol/L — ABNORMAL HIGH (ref 135–145)
TCO2: 25 mmol/L (ref 22–32)

## 2023-08-09 LAB — CBC WITH DIFFERENTIAL/PLATELET
Abs Immature Granulocytes: 0.11 10*3/uL — ABNORMAL HIGH (ref 0.00–0.07)
Basophils Absolute: 0 10*3/uL (ref 0.0–0.1)
Basophils Relative: 0 %
Eosinophils Absolute: 0.1 10*3/uL (ref 0.0–0.5)
Eosinophils Relative: 1 %
HCT: 26.7 % — ABNORMAL LOW (ref 36.0–46.0)
Hemoglobin: 7.5 g/dL — ABNORMAL LOW (ref 12.0–15.0)
Immature Granulocytes: 1 %
Lymphocytes Relative: 13 %
Lymphs Abs: 2.1 10*3/uL (ref 0.7–4.0)
MCH: 24.4 pg — ABNORMAL LOW (ref 26.0–34.0)
MCHC: 28.1 g/dL — ABNORMAL LOW (ref 30.0–36.0)
MCV: 86.7 fL (ref 80.0–100.0)
Monocytes Absolute: 0.8 10*3/uL (ref 0.1–1.0)
Monocytes Relative: 5 %
Neutro Abs: 12.5 10*3/uL — ABNORMAL HIGH (ref 1.7–7.7)
Neutrophils Relative %: 80 %
Platelets: 353 10*3/uL (ref 150–400)
RBC: 3.08 MIL/uL — ABNORMAL LOW (ref 3.87–5.11)
RDW: 21.9 % — ABNORMAL HIGH (ref 11.5–15.5)
WBC: 15.5 10*3/uL — ABNORMAL HIGH (ref 4.0–10.5)
nRBC: 0.1 % (ref 0.0–0.2)

## 2023-08-09 LAB — PROTIME-INR
INR: 1.2 (ref 0.8–1.2)
Prothrombin Time: 15.8 s — ABNORMAL HIGH (ref 11.4–15.2)

## 2023-08-09 LAB — BLOOD GAS, VENOUS
Acid-Base Excess: 1.3 mmol/L (ref 0.0–2.0)
Bicarbonate: 26.6 mmol/L (ref 20.0–28.0)
O2 Saturation: 92 %
Patient temperature: 37
pCO2, Ven: 45 mm[Hg] (ref 44–60)
pH, Ven: 7.38 (ref 7.25–7.43)
pO2, Ven: 63 mm[Hg] — ABNORMAL HIGH (ref 32–45)

## 2023-08-09 LAB — TSH: TSH: 1.598 u[IU]/mL (ref 0.350–4.500)

## 2023-08-09 LAB — I-STAT CG4 LACTIC ACID, ED: Lactic Acid, Venous: 3.3 mmol/L (ref 0.5–1.9)

## 2023-08-09 LAB — TROPONIN I (HIGH SENSITIVITY)
Troponin I (High Sensitivity): 373 ng/L (ref <18)
Troponin I (High Sensitivity): 319 ng/L (ref <18)

## 2023-08-09 LAB — COMPREHENSIVE METABOLIC PANEL
ALT: 27 U/L (ref 0–44)
AST: 82 U/L — ABNORMAL HIGH (ref 15–41)
Albumin: 2.7 g/dL — ABNORMAL LOW (ref 3.5–5.0)
Alkaline Phosphatase: 156 U/L — ABNORMAL HIGH (ref 38–126)
Anion gap: 10 (ref 5–15)
BUN: 54 mg/dL — ABNORMAL HIGH (ref 8–23)
CO2: 24 mmol/L (ref 22–32)
Calcium: 13.3 mg/dL (ref 8.9–10.3)
Chloride: 114 mmol/L — ABNORMAL HIGH (ref 98–111)
Creatinine, Ser: 2.92 mg/dL — ABNORMAL HIGH (ref 0.44–1.00)
GFR, Estimated: 17 mL/min — ABNORMAL LOW (ref >60)
Glucose, Bld: 118 mg/dL — ABNORMAL HIGH (ref 70–99)
Potassium: 4.7 mmol/L (ref 3.5–5.1)
Sodium: 148 mmol/L — ABNORMAL HIGH (ref 135–145)
Total Bilirubin: 0.5 mg/dL (ref 0.3–1.2)
Total Protein: 6.4 g/dL — ABNORMAL LOW (ref 6.5–8.1)

## 2023-08-09 LAB — RESP PANEL BY RT-PCR (RSV, FLU A&B, COVID)  RVPGX2
Influenza A by PCR: NEGATIVE
Influenza B by PCR: NEGATIVE
Resp Syncytial Virus by PCR: NEGATIVE
SARS Coronavirus 2 by RT PCR: NEGATIVE

## 2023-08-09 LAB — VITAMIN B12: Vitamin B-12: 267 pg/mL (ref 180–914)

## 2023-08-09 LAB — CBG MONITORING, ED: Glucose-Capillary: 120 mg/dL — ABNORMAL HIGH (ref 70–99)

## 2023-08-09 LAB — BRAIN NATRIURETIC PEPTIDE: B Natriuretic Peptide: 801.5 pg/mL — ABNORMAL HIGH (ref 0.0–100.0)

## 2023-08-09 LAB — APTT: aPTT: 21 s — ABNORMAL LOW (ref 24–36)

## 2023-08-09 MED ORDER — NOREPINEPHRINE 4 MG/250ML-% IV SOLN
2.0000 ug/min | INTRAVENOUS | Status: DC
  Administered 2023-08-09: 2 ug/min via INTRAVENOUS
  Filled 2023-08-09 (×2): qty 250

## 2023-08-09 MED ORDER — LACTATED RINGERS IV BOLUS (SEPSIS): 250.0000 mL | Freq: Once | INTRAVENOUS | Status: DC

## 2023-08-09 MED ORDER — LACTATED RINGERS IV BOLUS (SEPSIS): 1000.0000 mL | Freq: Once | INTRAVENOUS | Status: DC

## 2023-08-09 MED ORDER — SODIUM CHLORIDE 0.9 % IV BOLUS
250.0000 mL | Freq: Once | INTRAVENOUS | Status: AC
  Administered 2023-08-09: 250 mL via INTRAVENOUS

## 2023-08-09 MED ORDER — NICOTINE 14 MG/24HR TD PT24: 14.0000 mg | MEDICATED_PATCH | Freq: Every day | TRANSDERMAL | Status: DC | PRN

## 2023-08-09 MED ORDER — SODIUM CHLORIDE 0.9 % IV BOLUS
1000.0000 mL | Freq: Once | INTRAVENOUS | Status: AC
  Administered 2023-08-09: 1000 mL via INTRAVENOUS

## 2023-08-09 MED ORDER — LORAZEPAM 2 MG/ML IJ SOLN: 1.0000 mg | INTRAMUSCULAR | Status: DC | PRN

## 2023-08-09 MED ORDER — DOCUSATE SODIUM 100 MG PO CAPS: 100.0000 mg | ORAL_CAPSULE | Freq: Two times a day (BID) | ORAL | Status: DC | PRN

## 2023-08-09 MED ORDER — LACTATED RINGERS IV BOLUS
500.0000 mL | Freq: Once | INTRAVENOUS | Status: AC
  Administered 2023-08-09: 500 mL via INTRAVENOUS

## 2023-08-09 MED ORDER — FOLIC ACID 1 MG PO TABS: 1.0000 mg | ORAL_TABLET | Freq: Every day | ORAL | Status: DC

## 2023-08-09 MED ORDER — LORAZEPAM 1 MG PO TABS: 1.0000 mg | ORAL_TABLET | ORAL | Status: DC | PRN

## 2023-08-09 MED ORDER — ACETAMINOPHEN 650 MG RE SUPP
650.0000 mg | Freq: Once | RECTAL | Status: AC
  Administered 2023-08-09: 650 mg via RECTAL
  Filled 2023-08-09: qty 1

## 2023-08-09 MED ORDER — SODIUM CHLORIDE 0.9 % IV SOLN: 250.0000 mL | INTRAVENOUS | Status: DC

## 2023-08-09 MED ORDER — POLYETHYLENE GLYCOL 3350 17 G PO PACK
17.0000 g | PACK | Freq: Every day | ORAL | Status: DC | PRN
Start: 2023-08-09 — End: 2023-08-10

## 2023-08-09 MED ORDER — METRONIDAZOLE 500 MG/100ML IV SOLN
500.0000 mg | Freq: Once | INTRAVENOUS | Status: AC
Start: 2023-08-09 — End: 2023-08-09
  Administered 2023-08-09: 500 mg via INTRAVENOUS
  Filled 2023-08-09: qty 100

## 2023-08-09 MED ORDER — ADULT MULTIVITAMIN W/MINERALS CH: 1.0000 | ORAL_TABLET | Freq: Every day | ORAL | Status: DC

## 2023-08-09 MED ORDER — THIAMINE MONONITRATE 100 MG PO TABS: 100.0000 mg | ORAL_TABLET | Freq: Every day | ORAL | Status: DC

## 2023-08-09 MED ORDER — LACTATED RINGERS IV BOLUS (SEPSIS)
1000.0000 mL | Freq: Once | INTRAVENOUS | Status: AC
Start: 2023-08-09 — End: 2023-08-09
  Administered 2023-08-09: 1000 mL via INTRAVENOUS

## 2023-08-09 MED ORDER — CEFEPIME HCL 2 G IV SOLR
2.0000 g | Freq: Once | INTRAVENOUS | Status: AC
  Administered 2023-08-09: 2 g via INTRAVENOUS
  Filled 2023-08-09: qty 12.5

## 2023-08-09 MED ORDER — METRONIDAZOLE 500 MG/100ML IV SOLN: 500.0000 mg | Freq: Two times a day (BID) | INTRAVENOUS | Status: DC

## 2023-08-09 MED ORDER — ALPRAZOLAM 0.5 MG PO TABS: 1.0000 mg | ORAL_TABLET | Freq: Three times a day (TID) | ORAL | Status: DC

## 2023-08-09 MED ORDER — NOREPINEPHRINE 4 MG/250ML-% IV SOLN: 0.0000 ug/min | INTRAVENOUS | Status: DC

## 2023-08-09 MED ORDER — ACETAMINOPHEN 325 MG PO TABS: 650.0000 mg | ORAL_TABLET | ORAL | Status: DC | PRN

## 2023-08-09 MED ORDER — LACTATED RINGERS IV SOLN: INTRAVENOUS | Status: DC

## 2023-08-09 MED ORDER — VANCOMYCIN HCL IN DEXTROSE 1-5 GM/200ML-% IV SOLN: 1000.0000 mg | INTRAVENOUS | Status: DC

## 2023-08-09 MED ORDER — SODIUM CHLORIDE 0.9 % IV SOLN: 2.0000 g | Freq: Every day | INTRAVENOUS | Status: DC

## 2023-08-09 MED ORDER — THIAMINE HCL 100 MG/ML IJ SOLN: 100.0000 mg | Freq: Every day | INTRAMUSCULAR | Status: DC

## 2023-08-09 MED ORDER — NICOTINE 21 MG/24HR TD PT24: 21.0000 mg | MEDICATED_PATCH | Freq: Every day | TRANSDERMAL | Status: DC | PRN

## 2023-08-09 MED ORDER — VANCOMYCIN HCL IN DEXTROSE 1-5 GM/200ML-% IV SOLN
1000.0000 mg | Freq: Once | INTRAVENOUS | Status: AC
  Administered 2023-08-09: 1000 mg via INTRAVENOUS
  Filled 2023-08-09: qty 200

## 2023-08-09 MED ORDER — FLUTICASONE FUROATE-VILANTEROL 100-25 MCG/ACT IN AEPB: 1.0000 | INHALATION_SPRAY | Freq: Every day | RESPIRATORY_TRACT | Status: DC

## 2023-08-09 NOTE — Progress Notes (Signed)
eLINK following for sepsis protocol. ?

## 2023-08-09 NOTE — ED Provider Notes (Signed)
[place] EMERGENCY DEPARTMENT AT [facility] Provider Note   CSN: [id number] Arrival date & time: 08/09/23  1734     History {Add pertinent medical, surgical, social history, OB history to HPI:1} Chief Complaint  Patient presents with   Altered Mental Status   Code Sepsis    JEANEEN PYKA is a 67 y.o. female.   Altered Mental Status 67 year old female with history of metastatic cervical cancer, alcohol abuse, hypertension, COPD, recent admission at [facility] for altered mental status and AKI presenting for altered mental status.  I reviewed patient's recent discharge summary, appears at the time her daughter [name] gave medical decision power to patient Sister Darel Hong.  I spoke with Darel Hong who stated patient was slightly more confused yesterday.  She states that she makes patient's medical decisions and the patient's daughter [name] is on the way to the hospital.  Per discharge summary there appears to have been discussion about possible palliative care/hospice.  Patient confused and not able to participate in history.     Home Medications Prior to Admission medications   Medication Sig Start Date End Date Taking? Authorizing Provider  acetaminophen (TYLENOL) 325 MG tablet Take 2 tablets (650 mg total) by mouth every 6 (six) hours as needed for mild pain (1-3) or moderate pain (4-6). 08/05/23   Petrucelli, Samantha R, PA-C  ALPRAZolam (XANAX) 1 MG tablet Take 1 tablet (1 mg total) by mouth 3 (three) times daily. 06/10/23   Nwoko, Tommas Olp, PA  amLODipine (NORVASC) 5 MG tablet Take 1 tablet (5 mg total) by mouth daily. 06/06/23   [name], MD  amLODipine (NORVASC) 5 MG tablet Take 2 tablets (10 mg total) by mouth daily. 08/05/23     atorvastatin (LIPITOR) 40 MG tablet Take 1 tablet (40 mg total) by mouth daily. 05/13/23   [name], NP  FLUoxetine (PROZAC) 20 MG capsule Take 3 capsules (60 mg total) by mouth daily. 06/10/23   Nwoko, Tommas Olp, PA   fluticasone-salmeterol (ADVAIR) 100-50 MCG/ACT AEPB Inhale 1 puff into the lungs 2 (two) times daily. 05/13/23   [name], NP  ibuprofen (ADVIL) 200 MG tablet Take 600 mg by mouth in the morning and at bedtime.    [provider]  nicotine (NICODERM CQ - DOSED IN MG/24 HOURS) 21 mg/24hr patch Place 1 patch (21 mg total) onto the skin daily. Patient not taking: Reported on 07/22/2023 06/07/23   [name], MD  ondansetron (ZOFRAN-ODT) 4 MG disintegrating tablet Dissolve 1 tablet (4 mg total) on tongue every 6 (six) hours as needed for nausea or vomiting. 08/05/23     piperacillin-tazobactam (ZOSYN) 3.375 GM/50ML IVPB Inject 50 mLs (3.375 g total) into the vein every 8 (eight) hours. 07/28/23   Shahmehdi, Gemma Payor, MD  potassium chloride SA (KLOR-CON M) 20 MEQ tablet Take 1 tablet (20 mEq total) by mouth daily. 06/06/23   [name], MD  senna-docusate (SENOKOT-S) 8.6-50 MG tablet Take 1 tablet by mouth daily as needed for constipation. 08/05/23         Allergies    Hydromorphone    Review of Systems   Review of Systems  Unable to perform ROS: Mental status change    Physical Exam Updated Vital Signs BP (!) 88/54   Pulse (!) 101   Temp 99.9 F (37.7 C) (Oral)   Resp (!) 26   SpO2 100%  Physical Exam Vitals and nursing note reviewed.  Constitutional:      General: She is  in acute distress.     Appearance: She is well-developed. She is ill-appearing.  HENT:     Head: Normocephalic and atraumatic.     Mouth/Throat:     Mouth: Mucous membranes are dry.  Eyes:     Extraocular Movements: Extraocular movements intact.     Conjunctiva/sclera: Conjunctivae normal.     Pupils: Pupils are equal, round, and reactive to light.  Cardiovascular:     Rate and Rhythm: Normal rate and regular rhythm.     Heart sounds: No murmur heard. Pulmonary:     Effort: Pulmonary effort is normal. No respiratory distress.     Breath sounds: Normal breath sounds.  Abdominal:      General: There is distension.     Palpations: Abdomen is soft.     Tenderness: There is abdominal tenderness. There is no guarding or rebound.  Musculoskeletal:        General: No swelling.     Cervical back: Neck supple.     Right lower leg: No edema.     Left lower leg: No edema.  Skin:    General: Skin is warm and dry.     Capillary Refill: Capillary refill takes 2 to 3 seconds.  Neurological:     Comments: Patient is awake, she is able to try to mumble her name.  Intermittently well wiggle toes or squeeze her fingers to command.  Not able to answer questions.  She is able to open her eyes to verbal stimuli, localizes pain.  No gross facial asymmetry.     ED Results / Procedures / Treatments   Labs (all labs ordered are listed, but only abnormal results are displayed) Labs Reviewed - No data to display  EKG None  Radiology No results found.  Procedures Procedures  {Document cardiac monitor, telemetry assessment procedure when appropriate:1}  Medications Ordered in ED Medications - No data to display  ED Course/ Medical Decision Making/ A&P   {   Click here for ABCD2, HEART and other calculatorsREFRESH Note before signing :1}                              Medical Decision Making Amount and/or Complexity of Data Reviewed Labs: ordered. Radiology: ordered. ECG/medicine tests: ordered.  Risk OTC drugs. Prescription drug management.   Medical Decision Making:   YOSSELYN STUTE is a 67 y.o. female who presented to the ED today with fever, altered mental status.  She arrives febrile, tachycardic, borderline hypotensive and hypoxic concern for sepsis.  Sepsis protocol initiated.  She is well-appearing, GCS approximately 10 was intermittent following of commands.  She appears dry, confused.  There was discussion of possible hospice care during last hospitalization, I talked with patient's Medical Decision Making who is her sister who stated patient would not want inpatient  if needed she thinks.  Blood sugar here is okay.  She appears to have an AKI, hypercalcemia and elevated lactic acid.  She is not acidotic and has normal CO2, I do not think she needs intubation at this time although I discussed with family that her medical status was quite tenuous.  She appears to be in bigeminy potentially related to Hypercalcemia.   {crccomplexity:27900} Reviewed and confirmed nursing documentation for past medical history, family history, social history.  Initial Study Results:   Laboratory  All laboratory results reviewed.  Labs notable for ***  ***EKG EKG was reviewed independently. Rate, rhythm, axis, intervals all examined  and without medically relevant abnormality. ST segments without concerns for elevations.    Radiology:  All images reviewed independently. ***Agree with radiology report at this time.      Consults: Case discussed with ***.   Reassessment and Plan:   ***    Patient's presentation is most consistent with {EM COPA:27473}     {Document critical care time when appropriate:1} {Document review of labs and clinical decision tools ie heart score, Chads2Vasc2 etc:1}  {Document your independent review of radiology images, and any outside records:1} {Document your discussion with family members, caretakers, and with consultants:1} {Document social determinants of health affecting pt's care:1} {Document your decision making why or why not admission, treatments were needed:1} Final Clinical Impression(s) / ED Diagnoses Final diagnoses:  None    Rx / DC Orders ED Discharge Orders     None

## 2023-08-09 NOTE — ED Notes (Signed)
..ED TO INPATIENT HANDOFF REPORT  Name/Age/Gender Kimberly Adkins 67 y.o. female  Code Status    Code Status Orders  (From admission, onward)           Start     Ordered   08/09/23 1956  Do not attempt resuscitation (DNR)- Limited -Do Not Intubate (DNI)  Continuous       Question Answer Comment  If pulseless and not breathing No CPR or chest compressions.   In Pre-Arrest Conditions (Patient Is Breathing and Has A Pulse) Do not intubate. Provide all appropriate non-invasive medical interventions. Avoid ICU transfer unless indicated or required.   Consent: Discussion documented in EHR or advanced directives reviewed      08/09/23 1956           Code Status History     Date Active Date Inactive Code Status Order ID Comments User Context   07/22/2023 1046 07/30/2023 0839 Full Code [id number]  Erick Blinks, DO ED   06/03/2023 2325 06/06/2023 1959 Full Code [id number]  Lilyan Gilford, DO Inpatient   12/07/2022 2159 12/09/2022 1905 Full Code [id number]  Mancel Bale, NP Inpatient       Home/SNF/Other Nursing Home  Chief Complaint Septic shock (HCC) [A41.9, R65.21]  Level of Care/Admitting Diagnosis ED Disposition     ED Disposition  Admit   Condition  --   Comment  Hospital Area: [facility] [[id number]]  Level of Care: ICU [6]  May admit patient to [facility] or [facility] if equivalent level of care is available:: No  Covid Evaluation: Asymptomatic - no recent exposure (last 10 days) testing not required  Diagnosis: Septic shock [facility]) [[id number]]  Admitting Physician: Roslynn Amble [[id number]]  Attending Physician: Roslynn Amble [[id number]]  Certification:: I certify this patient will need inpatient services for at least 2 midnights  Expected Medical Readiness: 08/13/2023          Medical History Past Medical History:  Diagnosis Date   Anxiety    Depression    GERD (gastroesophageal reflux disease)    Hyperlipidemia     Hypertension    Osteoarthritis    PTSD (post-traumatic stress disorder) 2010    Allergies Allergies  Allergen Reactions   Hydromorphone Itching    IV Location/Drains/Wounds Patient Lines/Drains/Airways Status     Active Line/Drains/Airways     Name Placement date Placement time Site Days   Peripheral IV 07/29/23 22 G Distal;Posterior;Right Forearm 07/29/23  0703  Forearm  11   Peripheral IV 08/09/23 22 G Left;Posterior Hand 08/09/23  1958  Hand  less than 1            Labs/Imaging Results for orders placed or performed during the hospital encounter of 08/09/23 (from the past 48 hour(s))  Comprehensive metabolic panel     Status: Abnormal   Collection Time: 08/09/23  5:42 PM  Result Value Ref Range   Sodium 148 (H) 135 - 145 mmol/L   Potassium 4.7 3.5 - 5.1 mmol/L   Chloride 114 (H) 98 - 111 mmol/L   CO2 24 22 - 32 mmol/L   Glucose, Bld 118 (H) 70 - 99 mg/dL    Comment: Glucose reference range applies only to samples taken after fasting for at least 8 hours.   BUN 54 (H) 8 - 23 mg/dL   Creatinine, Ser 6.44 (H) 0.44 - 1.00 mg/dL   Calcium 03.4 (HH) 8.9 - 10.3 mg/dL    Comment: CRITICAL RESULT CALLED TO, READ BACK  BY AND VERIFIED WITH Scherry Ran RN @ [phone] 08/09/23. GILBERTL    Total Protein 6.4 (L) 6.5 - 8.1 g/dL   Albumin 2.7 (L) 3.5 - 5.0 g/dL   AST 82 (H) 15 - 41 U/L   ALT 27 0 - 44 U/L   Alkaline Phosphatase 156 (H) 38 - 126 U/L   Total Bilirubin 0.5 0.3 - 1.2 mg/dL   GFR, Estimated 17 (L) >60 mL/min    Comment: (NOTE) Calculated using the CKD-EPI Creatinine Equation (2021)    Anion gap 10 5 - 15    Comment: Performed at [facility], [address], [place], [place] [address]  CBC with Differential     Status: Abnormal   Collection Time: 08/09/23  5:42 PM  Result Value Ref Range   WBC 15.5 (H) 4.0 - 10.5 K/uL   RBC 3.08 (L) 3.87 - 5.11 MIL/uL   Hemoglobin 7.5 (L) 12.0 - 15.0 g/dL   HCT 91.4 (L) 78.2 - 95.6 %   MCV 86.7 80.0 - 100.0 fL    MCH 24.4 (L) 26.0 - 34.0 pg   MCHC 28.1 (L) 30.0 - 36.0 g/dL   RDW 21.3 (H) 08.6 - 57.8 %   Platelets 353 150 - 400 K/uL   nRBC 0.1 0.0 - 0.2 %   Neutrophils Relative % 80 %   Neutro Abs 12.5 (H) 1.7 - 7.7 K/uL   Lymphocytes Relative 13 %   Lymphs Abs 2.1 0.7 - 4.0 K/uL   Monocytes Relative 5 %   Monocytes Absolute 0.8 0.1 - 1.0 K/uL   Eosinophils Relative 1 %   Eosinophils Absolute 0.1 0.0 - 0.5 K/uL   Basophils Relative 0 %   Basophils Absolute 0.0 0.0 - 0.1 K/uL   Immature Granulocytes 1 %   Abs Immature Granulocytes 0.11 (H) 0.00 - 0.07 K/uL   Polychromasia PRESENT     Comment: Performed at [facility], [address], [place], [place] [address]  Protime-INR     Status: Abnormal   Collection Time: 08/09/23  5:42 PM  Result Value Ref Range   Prothrombin Time 15.8 (H) 11.4 - 15.2 seconds   INR 1.2 0.8 - 1.2    Comment: (NOTE) INR goal varies based on device and disease states. Performed at [facility], [address], [place], [place] [address]   APTT     Status: Abnormal   Collection Time: 08/09/23  5:42 PM  Result Value Ref Range   aPTT 21 (L) 24 - 36 seconds    Comment: Performed at [facility], [address], [place], [place] [address]  Brain natriuretic peptide     Status: Abnormal   Collection Time: 08/09/23  5:42 PM  Result Value Ref Range   B Natriuretic Peptide 801.5 (H) 0.0 - 100.0 pg/mL    Comment: Performed at [facility], [address] [place], [place] [address]  Troponin I (High Sensitivity)     Status: Abnormal   Collection Time: 08/09/23  5:42 PM  Result Value Ref Range   Troponin I (High Sensitivity) 373 (HH) <18 ng/L    Comment: CRITICAL RESULT CALLED TO, READ BACK BY AND VERIFIED WITH Scherry Ran RN @ [phone] 08/09/23. GILBERTL (NOTE) Elevated high sensitivity troponin I (hsTnI) values and significant  changes across serial measurements may suggest ACS but many other   chronic and acute conditions are known to elevate hsTnI results.  Refer to the "Links" section for chest  pain algorithms and additional  guidance. Performed at [facility], [address] [place], [place] [address]   CBG monitoring, ED     Status: Abnormal   Collection Time: 08/09/23  5:43 PM  Result Value Ref Range   Glucose-Capillary 120 (H) 70 - 99 mg/dL    Comment: Glucose reference range applies only to samples taken after fasting for at least 8 hours.  Blood gas, venous (at [facility] and [facility])     Status: Abnormal   Collection Time: 08/09/23  5:51 PM  Result Value Ref Range   pH, Ven 7.38 7.25 - 7.43   pCO2, Ven 45 44 - 60 mmHg   pO2, Ven 63 (H) 32 - 45 mmHg   Bicarbonate 26.6 20.0 - 28.0 mmol/L   Acid-Base Excess 1.3 0.0 - 2.0 mmol/L   O2 Saturation 92 %   Patient temperature 37.0     Comment: Performed at [facility], [address] [place], [place] [address]  I-stat chem 8, ED (not at [facility], [facility] or [facility])     Status: Abnormal   Collection Time: 08/09/23  5:52 PM  Result Value Ref Range   Sodium 148 (H) 135 - 145 mmol/L   Potassium 4.8 3.5 - 5.1 mmol/L   Chloride 118 (H) 98 - 111 mmol/L   BUN 54 (H) 8 - 23 mg/dL   Creatinine, Ser 5.62 (H) 0.44 - 1.00 mg/dL   Glucose, Bld 130 (H) 70 - 99 mg/dL    Comment: Glucose reference range applies only to samples taken after fasting for at least 8 hours.   Calcium, Ion 1.78 (HH) 1.15 - 1.40 mmol/L   TCO2 25 22 - 32 mmol/L   Hemoglobin 7.5 (L) 12.0 - 15.0 g/dL   HCT 86.5 (L) 78.4 - 69.6 %   Comment NOTIFIED PHYSICIAN   I-Stat CG4 Lactic Acid     Status: Abnormal   Collection Time: 08/09/23  5:52 PM  Result Value Ref Range   Lactic Acid, Venous 3.3 (HH) 0.5 - 1.9 mmol/L   Comment NOTIFIED PHYSICIAN    DG Chest Portable 1 View  Result Date: 08/09/2023 CLINICAL DATA:  Altered mental status and low oxygen saturations, hypotensive EXAM: PORTABLE CHEST 1 VIEW COMPARISON:  07/25/2023 FINDINGS: Low  lung volumes accentuate cardiomediastinal silhouette and pulmonary vascularity. Small nodular densities in the upper lungs are grossly unchanged. No focal consolidation, pleural effusion, or pneumothorax. No displaced rib fractures. IMPRESSION: No acute radiographic abnormality. Small nodular densities in the upper lungs are grossly unchanged. Electronically Signed   By: Minerva Fester M.D.   On: 08/09/2023 19:53    Pending Labs Unresulted Labs (From admission, onward)     Start     Ordered    0500  CBC  Tomorrow morning,   R        08/09/23 1956    0500  Basic metabolic panel  Tomorrow morning,   R        08/09/23 1956    0500  Hepatic function panel  Tomorrow morning,   R        08/09/23 1957   08/09/23 1950  Urinalysis, Routine w reflex microscopic -Urine, Clean Catch  Once,   URGENT       Question:  Specimen Source  Answer:  Urine, Clean Catch   08/09/23 1949   08/09/23 1749  Blood culture (routine x 2)  BLOOD CULTURE X 2,   R (with STAT occurrences)  08/09/23 1748   08/09/23 1748  Resp panel by RT-PCR (RSV, Flu A&B, Covid) Anterior Nasal Swab  (Septic presentation on arrival (screening labs, nursing and treatment orders for obvious sepsis))  Once,   URGENT        08/09/23 1748   08/09/23 1747  Urinalysis, w/ Reflex to Culture (Infection Suspected) -Urine, Catheterized  Once,   URGENT       Question:  Specimen Source  Answer:  Urine, Catheterized   08/09/23 1747            Vitals/Pain Today's Vitals   08/09/23 1815 08/09/23 1830 08/09/23 1930 08/09/23 1940  BP: (!) 94/53 (!) 87/53 100/66 (!) 89/47  Pulse: (!) 51 88 85 84  Resp: (!) 25 (!) 23 (!) 24 20  Temp:      TempSrc:      SpO2: 92% 92% 98% 95%  PainSc:        Isolation Precautions No active isolations  Medications Medications  vancomycin (VANCOCIN) IVPB 1000 mg/200 mL premix (1,000 mg Intravenous New Bag/Given 08/09/23 1943)  norepinephrine (LEVOPHED) 4mg  in (0.016 mg/mL)  premix infusion (0 mcg/min Intravenous Hold 08/09/23 1841)  docusate sodium (COLACE) capsule 100 mg (has no administration in time range)  polyethylene glycol (MIRALAX / GLYCOLAX) packet 17 g (has no administration in time range)  acetaminophen (TYLENOL) tablet 650 mg (has no administration in time range)  ALPRAZolam (XANAX) tablet 1 mg (has no administration in time range)  LORazepam (ATIVAN) tablet 1-4 mg (has no administration in time range)    Or  LORazepam (ATIVAN) injection 1-4 mg (has no administration in time range)  thiamine (VITAMIN B1) tablet 100 mg (has no administration in time range)    Or  thiamine (VITAMIN B1) injection 100 mg (has no administration in time range)  folic acid (FOLVITE) tablet 1 mg (has no administration in time range)  multivitamin with minerals tablet 1 tablet (has no administration in time range)  fluticasone furoate-vilanterol (BREO ELLIPTA) 100-25 MCG/ACT 1 puff (has no administration in time range)  nicotine (NICODERM CQ - dosed in mg/24 hours) patch 21 mg (has no administration in time range)  lactated ringers bolus 1,000 mL (0 mLs Intravenous Stopped 08/09/23 1817)  ceFEPIme (MAXIPIME) 2 g in sodium chloride 0.9 % 100 mL IVPB (2 g Intravenous New Bag/Given 08/09/23 1813)  metroNIDAZOLE (FLAGYL) IVPB 500 mg (500 mg Intravenous New Bag/Given 08/09/23 1814)  acetaminophen (TYLENOL) suppository 650 mg (650 mg Rectal Given 08/09/23 1814)  sodium chloride 0.9 % bolus 1,000 mL (1,000 mLs Intravenous New Bag/Given 08/09/23 1818)  sodium chloride 0.9 % bolus 1,000 mL (1,000 mLs Intravenous Bolus 08/09/23 1926)  sodium chloride 0.9 % bolus 1,000 mL (1,000 mLs Intravenous Bolus 08/09/23 1948)    Followed by  sodium chloride 0.9 % bolus 250 mL (250 mLs Intravenous Bolus 08/09/23 1949)    Mobility non-ambulatory

## 2023-08-09 NOTE — Progress Notes (Signed)
eLink Physician-Brief Progress Note Patient Name: Kimberly Adkins DOB: 01/18/1956 MRN: [id number]   Date of Service  08/09/2023  HPI/Events of Note  67 yr old female with metastatic cervical ca and chronic prescription benzo use admitted with sepsis.  Prognosis from standpoint of malignancy poor.  Patient DNR/DNI.  Treatment of acute sepsis ongoing.    eICU Interventions  Chart reviewed     Intervention Category Evaluation Type: New Patient Evaluation  Henry Russel, P 08/09/2023, 11:03 PM

## 2023-08-09 NOTE — Progress Notes (Signed)
A consult was received from an ED physician for vancomycin and cefepime per pharmacy dosing (for an indication other than meningitis). The patient's profile has been reviewed for ht/wt/allergies/indication/available labs. A one time order has been placed for the above antibiotics.  Further antibiotics/pharmacy consults should be ordered by admitting physician if indicated.                       Bernadene Person, PharmD, BCPS [phone] 08/09/2023, 5:50 PM

## 2023-08-09 NOTE — Progress Notes (Signed)
    Call from eR Doc to [place] day doc at 6:48 PM 08/09/2023    - patient has cervical cancer metastiatic  - told at [facility] needed hospice - Now here with septic shock , AKI, - came in ad full code   Per ER goals of care No CPR, NO intubation but Rx ith fluids, abx and pressors, they are thinking of hospice ER has tarted pressors and abx ER is getting CT   Night CCM team to admit     SIGNATURE    Dr. Kalman Shan, M.D., F.C.C.P,  Pulmonary and Critical Care Medicine Staff Physician, [facility] Center Director - Interstitial Lung Disease  Program  Pulmonary Fibrosis [facility] at [facility] [place], [place], [address]   Pager: [phone], If no answer  -> Check AMION or Try [phone] Telephone (clinical office): [phone] Telephone (research): [phone]  6:48 PM 08/09/2023

## 2023-08-09 NOTE — H&P (Addendum)
NAME:  Kimberly Adkins, MRN:  [id number], DOB:  October 19, 1955, LOS: 0 ADMISSION DATE:  07/24/2023, CONSULTATION DATE:  08/06/2023 REFERRING MD:  Fulton Reek, M.D. CHIEF COMPLAINT:  Septic Shock  History of Present Illness:  67 yo female with a longstanding history of prescription benzodiazepine use, binge drinking, and chronic tobacco use.  Patient was admitted earlier this month to outside hospital and ultimately discharged to [facility].  Patient underwent biopsy for what appeared to be metastatic cervical cancer (stg Ivb with mets liver lungs peritoneum) but these results are not available to me at this time.  She has been treated for recurrent urinary tract infections as well as intermittent encephalopathy.  Her daughter and sister both report that she has had periods of withdrawal when her prescription Xanax which she takes 3 times daily has been withheld.  By report [facility] physicians did not recommend treatment of her malignancy given its advanced stage.  Family were in discussions about goals of care and have been considering hospice care.  Patient was discharged to rehab and presented to the emergency department today from her rehab facility with acute encephalopathy.  Nursing staff had also noted abdominal distention with decreased intake/poor appetite.  Upon arrival in the emergency department the patient was noted to be hypotensive and was given bolus lactated Ringer's despite acute renal failure.  Patient was given empiric antimicrobial therapy with vancomycin, cefepime, and Flagyl.  At the time my evaluation patient was unable to provide me any history.  I was able to reach her Kimberly Adkins by phone and also talk to her daughter Kimberly Adkins at bedside. ED provider consulted PCCM for mission in the setting.  Significant Hospital Events: Including procedures, antibiotic start and stop dates in addition to other pertinent events   10/08 - Admit to [facility] before  transfer to [facility] 10/26 [facility] ED with AMS, septic shock AKI lyte abnormalities. Started on pressors in ED, PCCM to admit   Interim History / Subjective:  N/A  Objective   Blood pressure (!) 91/52, pulse 83, temperature 99.9 F (37.7 C), temperature source Oral, resp. rate (!) 22, SpO2 95%.       No intake or output data in the 24 hours ending 07/31/2023 2021 There were no vitals filed for this visit.  Examination: General:  Awake. No acute distress but obviously uncomfortable.  Elderly, Caucasian female.  Daughter and granddaughter at bedside.  Integument:  Warm & dry. No rash on exposed skin. Extremities:  No cyanosis. No clubbing.  Lymphatics:  No appreciated cervical or supraclavicular lymphadenoapthy. HEENT:  Dry mucus membranes. No oral ulcers. No scleral injection or icterus. Cardiovascular:  Regular rate. Regular rhythm. No edema. No murmur appreciated. Pulmonary:  Good aeration but much diminished at the bases.  Clear to auscultation bilaterally. Symmetric chest wall rise. Abdomen: Soft.  Protuberant.  Hypoactive bowel sounds. Musculoskeletal: Symmetrically decreased muscle bulk with normal tone.  No joint deformity or effusion appreciated. Neurological: Symmetric face.  No meningismus. Moving all 4 extremities equally.  Patient does follow commands intermittently. Psychiatric: Patient is not oriented.  She does intermittently follow commands.  Resolved Hospital Problem list   N/A  Assessment & Plan:  67 y.o. 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  Patient has a prior history of tobacco use as well as chronic prescription benzodiazepine use and intermittent binge drinking.  Patient's diagnosis of cancer is recent and recommendations from [facility]  were for hospice/palliative care with no plans for treatment per family report.  Patient presenting with severe sepsis and shock on ED arrival that was responsive to IV fluid.   Family have requested treatment of her sepsis and underlying medical conditions that can be reversed to allow her more time with family but also do not wish to prolong her suffering either.  1.  Severe sepsis: Present on arrival.  Secondary to UTI with probable intra-abdominal infection due to metastatic cancer.  Continuing to trend lactic acid.  Infectious workup pending.  Continuing treatment as follows.  2.  Acute metabolic encephalopathy: Multifactorial in etiology with underlying severe sepsis as well as chronic benzodiazepine use.  Starting scheduled thiamine daily and continuing scheduled benzodiazepine.  Placing patient on CIWA protocol.  Checking TSH, free T4, and B12.  3.  Shock: Likely secondary to severe sepsis but also with evidence of severe hypovolemia.  Checking stat serum cortisol.  Monitoring vitals per unit protocol.  Treatment of infection as follows.  Continuing gentle IV fluid replacement with normal saline 250 cc/h x 1 dose.  Monitoring vitals per unit protocol.  Utilizing her vasopressor infusion if needed to maintain mean arterial pressure greater than 65.  4.  UTI with intra-abdominal infection: Known metastatic cancer with likely cervical cancer primary.  Blood cultures pending.  Placing Foley catheter and checking urine culture as well.  Continuing empiric antimicrobial therapy with vancomycin, cefepime, and Flagyl renally dosed.  5.  Acute renal failure: Secondary to hypovolemia as well as severe sepsis with shock.  Continuing gentle fluid resuscitation.  Avoiding nephrotoxic agents.  Trending renal function electrolytes daily.  Placing Foley catheter to monitor urine output.  Maintaining normotension with vasopressor infusion if needed.  6.  Type II non-STEMI: Secondary to sepsis and shock with probable subendocardial ischemia.  I personally reviewed her EKG which does not suggest ischemia or STEMI.  Monitoring patient on telemetry.  Correcting hypotension and continuing  treatment of underlying severe sepsis.  7.  Lactic/metabolic acidosis: Secondary to severe sepsis with shock.  Also suspect poor clearance in the setting of acute renal failure.  Continuing to trend lactic acid.  Continuing gentle fluid resuscitation.  Maintaining normotension with vasopressor infusion as needed.  8.  Chronic prescription benzodiazepine use & binge alcohol consumption: Continuing home Xanax 3 times daily.  If she cannot take this by mouth we will need to schedule IV Ativan.  Starting thiamine.  Ordering CIWA protocol.  9.  Hypercalcemia: Continuing gentle fluid resuscitation.  Monitoring patient on telemetry.  Trending electrolytes daily.  10.  Hypernatremia/hyperchloremia: Secondary to hypovolemia and acute renal failure.  11.  Anemia: No clear evidence for acute blood loss.  Continuing to trend cell counts daily with CBC.  Threshold for transfusion would be hemoglobin < 7.0 or evidence of active blood loss if family wished to pursue aggressive treatment.  12.  Nicotine/cigarette dependence with questionable COPD: No evidence for acute exacerbation of potential airway disease at this time.  Holding all inhaled medications.  Continuing nicotine replacement with transdermal patch 14 mg / 24 hours.  13.  Hyperlipidemia: Holding home medication for now in the setting of patient's altered mental status.  14.  Hypertension: Holding home medication regimen in the setting of patient's shock/hypotension.  Monitoring vitals per unit protocol.  15.  Metastatic intra-abdominal cancer: Suspected cervical primary.  I do not have access to the patient's pathology report.  Family are currently planning on pursuing hospice care if she is able to survive her  acute illness.  Consulting case management to assist with discharge planning if she survives.  16.  CODE STATUS: I personally discussed her CODE STATUS with her daughter at bedside as well as with her Sister Kimberly Adkins by phone.  Both are in  agreement with a DNR/DNI CODE STATUS.  Best Practice (right click and "Reselect all SmartList Selections" daily)   Diet/type: NPO DVT prophylaxis: SCD GI prophylaxis: N/A Lines: N/A Foley:  N/A Code Status:  DNR Last date of multidisciplinary goals of care discussion [10/26]  Labs   CBC: Recent Labs  Lab 11-Aug-2023 1742 2023-08-11 1752  WBC 15.5*  --   NEUTROABS 12.5*  --   HGB 7.5* 7.5*  HCT 26.7* 22.0*  MCV 86.7  --   PLT 353  --     Basic Metabolic Panel: Recent Labs  Lab August 11, 2023 1742 2023/08/11 1752  NA 148* 148*  K 4.7 4.8  CL 114* 118*  CO2 24  --   GLUCOSE 118* 109*  BUN 54* 54*  CREATININE 2.92* 3.20*  CALCIUM 13.3*  --    GFR: CrCl cannot be calculated (Unknown ideal weight.). Recent Labs  Lab August 11, 2023 1742 08/11/23 1752  WBC 15.5*  --   LATICACIDVEN  --  3.3*    Liver Function Tests: Recent Labs  Lab 08-11-2023 1742  AST 82*  ALT 27  ALKPHOS 156*  BILITOT 0.5  PROT 6.4*  ALBUMIN 2.7*   No results for input(s): "LIPASE", "AMYLASE" in the last 168 hours. No results for input(s): "AMMONIA" in the last 168 hours.  ABG    Component Value Date/Time   HCO3 26.6 Aug 11, 2023 1751   TCO2 25 Aug 11, 2023 1752   O2SAT 92 08/11/2023 1751     Coagulation Profile: Recent Labs  Lab 08-11-23 1742  INR 1.2    Cardiac Enzymes: No results for input(s): "CKTOTAL", "CKMB", "CKMBINDEX", "TROPONINI" in the last 168 hours.  HbA1C: Hgb A1c MFr Bld  Date/Time Value Ref Range Status  12/08/2022 07:06 AM 5.8 (H) 4.8 - 5.6 % Final    Comment:    (NOTE)         Prediabetes: 5.7 - 6.4         Diabetes: >6.4         Glycemic control for adults with diabetes: <7.0   01/01/2021 02:15 PM 5.8 (H) 4.8 - 5.6 % Final    Comment:             Prediabetes: 5.7 - 6.4          Diabetes: >6.4          Glycemic control for adults with diabetes: <7.0     CBG: Recent Labs  Lab 2023/08/11 1743  GLUCAP 120*    IMAGING: PORT CXR 08-11-23 (personally reviewed by  me): No new focal mass or opacity.  Minimal nodularity noted bilaterally.  No pleural effusion appreciated.  Heart normal in size.  Trachea midline.  Mediastinum normal in contour.  No acute osseous abnormality.  No pneumothorax.  CT HEAD W/O CONTRAST 11-Aug-2023 (per radiologist):   IMPRESSION: 1. No acute intracranial abnormality. 2. 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  CT ABDOMEN/PELVIS W/O CONTRAST 08/11/23 (per radiologist): IMPRESSION: 1. Ill-defined hypodense mass at the lower uterus/cervical junction is similar to increased in size from 07/21/2023 and compatible with malignancy. 2. Pulmonary, hepatic, omental, and mesenteric metastases as described. 3. Small volume abdominopelvic ascites greater on the right. 4. Similar mild left hydronephrosis without obstructing stone.  Review of Systems:   Unable to obtain an accurate review of systems given patient's acute encephalopathy.  Past Medical History:   Past Medical History:  Diagnosis Date   Alcohol abuse    Anxiety    Chronic prescription benzodiazepine use    Depression    GERD (gastroesophageal reflux disease)    Hyperlipidemia    Hypertension    Metastatic cancer (HCC)    To lungs w/ cervical cancer likely primary   Nicotine dependence    Osteoarthritis    PTSD (post-traumatic stress disorder) 10/14/2008    Surgical History:   Past Surgical History:  Procedure Laterality Date   ANTERIOR AND POSTERIOR REPAIR  2007   BREAST BIOPSY  1997   right; benign   TUBAL LIGATION  1994     Social History:   reports that she has been smoking cigarettes. She has a 33 pack-year smoking history. She has never used smokeless tobacco. She reports current alcohol use. She reports that she does not use drugs.   Family History:  Her family history includes Breast cancer in her mother, paternal aunt, and paternal grandmother; Cancer in her maternal grandmother and mother; Colon polyps (age of  onset: 48) in her maternal grandfather; Colon polyps (age of onset: 58) in her paternal grandmother; Coronary artery disease in her father; Heart attack in her daughter; Heart attack (age of onset: 68) in her father; Hypertension in her father; Throat cancer in her sister.   Allergies Allergies  Allergen Reactions   Hydromorphone Itching     Home Medications  Prior to Admission medications   Medication Sig Start Date End Date Taking? Authorizing Provider  acetaminophen (TYLENOL) 325 MG tablet Take 2 tablets (650 mg total) by mouth every 6 (six) hours as needed for mild pain (1-3) or moderate pain (4-6). 08/05/23   Petrucelli, Samantha R, PA-C  ALPRAZolam (XANAX) 1 MG tablet Take 1 tablet (1 mg total) by mouth 3 (three) times daily. 06/10/23   Nwoko, Tommas Olp, PA  amLODipine (NORVASC) 5 MG tablet Take 1 tablet (5 mg total) by mouth daily. 06/06/23   [name], MD  amLODipine (NORVASC) 5 MG tablet Take 2 tablets (10 mg total) by mouth daily. 08/05/23     atorvastatin (LIPITOR) 40 MG tablet Take 1 tablet (40 mg total) by mouth daily. 05/13/23   [name], NP  FLUoxetine (PROZAC) 20 MG capsule Take 3 capsules (60 mg total) by mouth daily. 06/10/23   Nwoko, Tommas Olp, PA  fluticasone-salmeterol (ADVAIR) 100-50 MCG/ACT AEPB Inhale 1 puff into the lungs 2 (two) times daily. 05/13/23   [name], NP  ibuprofen (ADVIL) 200 MG tablet Take 600 mg by mouth in the morning and at bedtime.    [provider]  nicotine (NICODERM CQ - DOSED IN MG/24 HOURS) 21 mg/24hr patch Place 1 patch (21 mg total) onto the skin daily. Patient not taking: Reported on 07/22/2023 06/07/23   [name], MD  ondansetron (ZOFRAN-ODT) 4 MG disintegrating tablet Dissolve 1 tablet (4 mg total) on tongue every 6 (six) hours as needed for nausea or vomiting. 08/05/23     piperacillin-tazobactam (ZOSYN) 3.375 GM/50ML IVPB Inject 50 mLs (3.375 g total) into the vein every 8 (eight) hours. 07/28/23   Shahmehdi,  Gemma Payor, MD  potassium chloride SA (KLOR-CON M) 20 MEQ tablet Take 1 tablet (20 mEq total) by mouth daily. 06/06/23   [name], MD  senna-docusate (SENOKOT-S) 8.6-50 MG tablet Take 1 tablet by mouth daily as  needed for constipation. 08/05/23        Critical care time:  I have spent a total of 58 minutes of critical care time today managing the patient's severe sepsis and multisystem organ failure independent of time spent during procedures caring for the patient, plan of care with her daughter at bedside, discussing plan of care with her sister Kimberly Adkins by phone, and reviewing the patient's electronic medical record.  The patient's prognosis is poor.  Her risk for further clinical deterioration and death due to her critical illness and comorbid medical conditions remains high.  It is unlikely she will survive her acute illness.    Donna Christen Jamison Neighbor, M.D. [facility] Pulmonary & Critical Care Medicine 8:21 PM 09/05/23   Please see Amion for pager details.  From 7A-7P if no response please call [phone] After hours, please call ELink [phone]

## 2023-08-09 NOTE — ED Triage Notes (Signed)
Per EMS, pt is coming from [facility]. Staff noticed Pt slowly becoming more AMS, and 02 saturation of 60%. Suspected pt is septic, and noted pts abd is distended. Hx of vaginal cancer. Vitals with EMS HR 80, 02 of 99 on non-rebreather, CBG of 74, and pressure of 96/40,

## 2023-08-09 NOTE — ED Notes (Signed)
Report given to Theora Gianotti, RN

## 2023-08-09 NOTE — Progress Notes (Signed)
Pharmacy Antibiotic Note  Kimberly Adkins is a 67 y.o. female admitted on 08/09/2023 with sepsis.  Pharmacy has been consulted for vancomycin and cefepime dosing. AKI noted on admission (baseline SCr < 1.0 as of late August)  Plan: Vancomycin 1000 mg IV q48 hr (est AUC 550 based on SCr 3.2; Vd 0.72) Measure vancomycin AUC at steady state as indicated SCr daily until stabilized, while on vanc Cefepime 2g IV q24 hr Flagyl 500 mg IV q12 hr per MD; dosing appropriate    Temp (24hrs), Avg:99.9 F (37.7 C), Min:99.9 F (37.7 C), Max:99.9 F (37.7 C)  Recent Labs  Lab 08/09/23 1742 08/09/23 1752  WBC 15.5*  --   CREATININE 2.92* 3.20*  LATICACIDVEN  --  3.3*    CrCl cannot be calculated (Unknown ideal weight.).    Allergies  Allergen Reactions   Hydromorphone Itching    Antimicrobials this admission: 10/26 vancomycin >>  10/26 cefepime >>  10/26 metronidazole >>   Dose adjustments this admission: N/a  Microbiology results: 10/26 BCx: sent   Thank you for allowing pharmacy to be a part of this patient's care.  Esha Fincher A 08/09/2023 9:41 PM

## 2023-08-10 LAB — CBC WITH DIFFERENTIAL/PLATELET
Abs Immature Granulocytes: 0.14 10*3/uL — ABNORMAL HIGH (ref 0.00–0.07)
Basophils Absolute: 0 10*3/uL (ref 0.0–0.1)
Basophils Relative: 0 %
Eosinophils Absolute: 0.1 10*3/uL (ref 0.0–0.5)
Eosinophils Relative: 0 %
HCT: 26 % — ABNORMAL LOW (ref 36.0–46.0)
Hemoglobin: 7.1 g/dL — ABNORMAL LOW (ref 12.0–15.0)
Immature Granulocytes: 1 %
Lymphocytes Relative: 11 %
Lymphs Abs: 2.1 10*3/uL (ref 0.7–4.0)
MCH: 24.8 pg — ABNORMAL LOW (ref 26.0–34.0)
MCHC: 27.3 g/dL — ABNORMAL LOW (ref 30.0–36.0)
MCV: 90.9 fL (ref 80.0–100.0)
Monocytes Absolute: 1 10*3/uL (ref 0.1–1.0)
Monocytes Relative: 6 %
Neutro Abs: 15 10*3/uL — ABNORMAL HIGH (ref 1.7–7.7)
Neutrophils Relative %: 82 %
Platelets: 346 10*3/uL (ref 150–400)
RBC: 2.86 MIL/uL — ABNORMAL LOW (ref 3.87–5.11)
RDW: 21.6 % — ABNORMAL HIGH (ref 11.5–15.5)
WBC: 18.3 10*3/uL — ABNORMAL HIGH (ref 4.0–10.5)
nRBC: 0 % (ref 0.0–0.2)

## 2023-08-10 LAB — HEPATIC FUNCTION PANEL
ALT: 25 U/L (ref 0–44)
AST: 62 U/L — ABNORMAL HIGH (ref 15–41)
Albumin: 2.5 g/dL — ABNORMAL LOW (ref 3.5–5.0)
Alkaline Phosphatase: 140 U/L — ABNORMAL HIGH (ref 38–126)
Bilirubin, Direct: 0.1 mg/dL (ref 0.0–0.2)
Indirect Bilirubin: 0.5 mg/dL (ref 0.3–0.9)
Total Bilirubin: 0.6 mg/dL (ref 0.3–1.2)
Total Protein: 5.8 g/dL — ABNORMAL LOW (ref 6.5–8.1)

## 2023-08-10 LAB — BASIC METABOLIC PANEL
Anion gap: 8 (ref 5–15)
BUN: 49 mg/dL — ABNORMAL HIGH (ref 8–23)
CO2: 22 mmol/L (ref 22–32)
Calcium: 11.1 mg/dL — ABNORMAL HIGH (ref 8.9–10.3)
Chloride: 117 mmol/L — ABNORMAL HIGH (ref 98–111)
Creatinine, Ser: 2.5 mg/dL — ABNORMAL HIGH (ref 0.44–1.00)
GFR, Estimated: 21 mL/min — ABNORMAL LOW (ref >60)
Glucose, Bld: 142 mg/dL — ABNORMAL HIGH (ref 70–99)
Potassium: 4.4 mmol/L (ref 3.5–5.1)
Sodium: 147 mmol/L — ABNORMAL HIGH (ref 135–145)

## 2023-08-10 LAB — T4, FREE: Free T4: 0.58 ng/dL — ABNORMAL LOW (ref 0.61–1.12)

## 2023-08-10 LAB — LACTIC ACID, PLASMA: Lactic Acid, Venous: 1.6 mmol/L (ref 0.5–1.9)

## 2023-08-10 LAB — CORTISOL: Cortisol, Plasma: 35.1 ug/dL

## 2023-08-11 ENCOUNTER — Other Ambulatory Visit (HOSPITAL_BASED_OUTPATIENT_CLINIC_OR_DEPARTMENT_OTHER): Payer: Self-pay

## 2023-08-12 ENCOUNTER — Other Ambulatory Visit: Payer: Self-pay

## 2023-08-14 LAB — CULTURE, BLOOD (ROUTINE X 2)
Culture: NO GROWTH
Culture: NO GROWTH
Special Requests: ADEQUATE
Special Requests: ADEQUATE

## 2023-08-15 NOTE — Discharge Summary (Signed)
DISCHARGE SUMMARY    Date of admit: 08/09/2023  5:36 PM Date of discharge:  10:00 AM Length of Stay: 1 days  PCP is Kimberly Rigg, NP  CAUSE(S) OF DEATH  Septic Shock Metastatic Cervical Cancer AKI DNR upon admission Protein calorie malnutriton   PROBLEM LIST Principal Problem:   Septic shock (HCC)    Present on Admission Present on Admission:  Septic shock [facility])   Active Problems Principal Problem:   Septic shock [facility])   Resolved Problems Active Hospital Problems   Diagnosis Date Noted   Septic shock (HCC) 08/09/2023    Resolved Hospital Problems  No resolved problems to display.     Comprehensive Problem List Patient Active Problem List   Diagnosis Date Noted   Septic shock (HCC) 08/09/2023   AKI (acute kidney injury) (HCC) 06/04/2023   Prolonged QT interval 06/04/2023   Hypokalemia 06/04/2023   UTI (urinary tract infection) 06/04/2023   Dehydration 06/04/2023   Acute metabolic encephalopathy 06/03/2023   MDD (major depressive disorder), recurrent severe, without psychosis (HCC) 12/07/2022   Alcohol use disorder 08/29/2022   Benzodiazepine dependence, continuous (HCC) 08/29/2022   MDD (major depressive disorder), recurrent episode, mild (HCC) 08/29/2022   Moderate episode of recurrent major depressive disorder (HCC) 07/26/2020   Benzodiazepine dependence (HCC) 04/28/2020   Allergic rhinitis 08/08/2014   Actinic keratoses 02/16/2013   Dyslipidemia 02/16/2013   Well adult exam 07/15/2012   Hand pain, right 06/04/2012   Hypertension    Hematochezia 01/21/2012   Elevated blood pressure 01/21/2012   Bronchitis 12/09/2011   COPD (chronic obstructive pulmonary disease) (HCC) 12/09/2011   Herpes labialis 11/03/2011   FURUNCLE 11/07/2009   PTSD 09/21/2009   HOARSENESS 09/21/2009   HYPERLIPIDEMIA 07/14/2009   Anxiety state 07/14/2009   Obsessive-compulsive disorder 07/14/2009   TOBACCO USE DISORDER/SMOKER-SMOKING CESSATION  DISCUSSED 07/14/2009   GRIEF REACTION 07/14/2009   MDD (major depressive disorder) 07/14/2009   GERD 07/14/2009   Osteoarthritis 07/14/2009      SUMMARY Kimberly Adkins was 67 y.o. patient with    has a past medical history of Alcohol abuse, Anxiety, Chronic prescription benzodiazepine use, Depression, GERD (gastroesophageal reflux disease), Hyperlipidemia, Hypertension, Metastatic cancer (HCC), Nicotine dependence, Osteoarthritis, and PTSD (post-traumatic stress disorder) (10/14/2008).   has a past surgical history that includes Anterior and posterior repair (2007); Tubal ligation (1994); and Breast biopsy (1997).   Admitted on 08/09/2023 with   History of Present Illness:  67 yo female with a longstanding history of prescription benzodiazepine use, binge drinking, and chronic tobacco use.  Patient was admitted earlier this month to outside hospital and ultimately discharged to [facility].  Patient underwent biopsy for what appeared to be metastatic cervical cancer (stg Ivb with mets liver lungs peritoneum) but these results are not available to me at this time.  She has been treated for recurrent urinary tract infections as well as intermittent encephalopathy.  Her daughter and sister both report that she has had periods of withdrawal when her prescription Xanax which she takes 3 times daily has been withheld.  By report [facility] physicians did not recommend treatment of her malignancy given its advanced stage.  Family were in discussions about goals of care and have been considering hospice care.  Patient was discharged to rehab and presented to the emergency department today from her rehab facility with acute encephalopathy.  Nursing staff had also noted abdominal distention with decreased intake/poor appetite.  Upon arrival in the emergency department the patient  was noted to be hypotensive and was given bolus lactated Ringer's despite acute renal failure.   Patient was given empiric antimicrobial therapy with vancomycin, cefepime, and Flagyl.  At the time my evaluation patient was unable to provide me any history.  I was able to reach her Kimberly Adkins by phone and also talk to her daughter Kimberly Adkins at bedside. ED provider consulted PCCM for mission in the setting.    Significant Hospital Events: Including procedures, antibiotic start and stop dates in addition to other pertinent events   10/08 - Admit to [facility] before transfer to [facility] 10/26 [facility] ED with AMS, septic shock AKI lyte abnormalities. Started on pressors in ED, PCCM to admit    Assessment & Plan:  67 y.o. 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  Patient has a prior history of tobacco use as well as chronic prescription benzodiazepine use and intermittent binge drinking.  Patient's diagnosis of cancer is recent and recommendations from [facility] were for hospice/palliative care with no plans for treatment per family report.  Patient presenting with severe sepsis and shock on ED arrival that was responsive to IV fluid.  Family have requested treatment of her sepsis and underlying medical conditions that can be reversed to allow her more time with family but also do not wish to prolong her suffering either.   1.  Severe sepsis: Present on arrival.  Secondary to UTI with probable intra-abdominal infection due to metastatic cancer.  Continuing to trend lactic acid.  Infectious workup pending.  Continuing treatment as follows.   2.  Acute metabolic encephalopathy: Multifactorial in etiology with underlying severe sepsis as well as chronic benzodiazepine use.  Starting scheduled thiamine daily and continuing scheduled benzodiazepine.  Placing patient on CIWA protocol.  Checking TSH, free T4, and B12.   3.  Shock: Likely secondary to severe sepsis but also with evidence of severe hypovolemia.  Checking stat serum cortisol.  Monitoring vitals  per unit protocol.  Treatment of infection as follows.  Continuing gentle IV fluid replacement with normal saline 250 cc/h x 1 dose.  Monitoring vitals per unit protocol.  Utilizing her vasopressor infusion if needed to maintain mean arterial pressure greater than 65.   4.  UTI with intra-abdominal infection: Known metastatic cancer with likely cervical cancer primary.  Blood cultures pending.  Placing Foley catheter and checking urine culture as well.  Continuing empiric antimicrobial therapy with vancomycin, cefepime, and Flagyl renally dosed.   5.  Acute renal failure: Secondary to hypovolemia as well as severe sepsis with shock.  Continuing gentle fluid resuscitation.  Avoiding nephrotoxic agents.  Trending renal function electrolytes daily.  Placing Foley catheter to monitor urine output.  Maintaining normotension with vasopressor infusion if needed.   6.  Type II non-STEMI: Secondary to sepsis and shock with probable subendocardial ischemia.  I personally reviewed her EKG which does not suggest ischemia or STEMI.  Monitoring patient on telemetry.  Correcting hypotension and continuing treatment of underlying severe sepsis.   7.  Lactic/metabolic acidosis: Secondary to severe sepsis with shock.  Also suspect poor clearance in the setting of acute renal failure.  Continuing to trend lactic acid.  Continuing gentle fluid resuscitation.  Maintaining normotension with vasopressor infusion as needed.   8.  Chronic prescription benzodiazepine use & binge alcohol consumption: Continuing home Xanax 3 times daily.  If she cannot take this by mouth we will need to schedule IV Ativan.  Starting thiamine.  Ordering CIWA protocol.   9.  Hypercalcemia: Continuing gentle fluid resuscitation.  Monitoring patient on telemetry.  Trending electrolytes daily.   10.  Hypernatremia/hyperchloremia: Secondary to hypovolemia and acute renal failure.   11.  Anemia: No clear evidence for acute blood loss.  Continuing to  trend cell counts daily with CBC.  Threshold for transfusion would be hemoglobin < 7.0 or evidence of active blood loss if family wished to pursue aggressive treatment.   12.  Nicotine/cigarette dependence with questionable COPD: No evidence for acute exacerbation of potential airway disease at this time.  Holding all inhaled medications.  Continuing nicotine replacement with transdermal patch 14 mg / 24 hours.   13.  Hyperlipidemia: Holding home medication for now in the setting of patient's altered mental status.   14.  Hypertension: Holding home medication regimen in the setting of patient's shock/hypotension.  Monitoring vitals per unit protocol.   15.  Metastatic intra-abdominal cancer: Suspected cervical primary.  I do not have access to the patient's pathology report.  Family are currently planning on pursuing hospice care if she is able to survive her acute illness.  Consulting case management to assist with discharge planning if she survives.   16.  CODE STATUS: I personally discussed her CODE STATUS with her daughter at bedside as well as with her Sister Darel Hong by phone.  Both are in agreement with a DNR/DNI CODE STATUS.     COURSE  - patient expired morning of       SIGNED Dr. Kalman Shan, M.D., F.C.C.P Pulmonary and Critical Care Medicine Staff Physician [facility] Pager: [phone], If no answer or between  15:00h - 7:00h: call [phone]  08/12/2023 3:37 PM

## 2023-08-15 NOTE — Progress Notes (Signed)
Patient died right at start of morning rounds Went to see her and had just died    SIGNATURE    Dr. Kalman Shan, M.D., F.C.C.P,  Pulmonary and Critical Care Medicine Staff Physician, [facility] Director - Interstitial Lung Disease  Program  Pulmonary Fibrosis [facility] at [facility] [place], [place], [address]   Pager: [phone], If no answer  -> Check AMION or Try [phone] Telephone (clinical office): [phone] Telephone (research): [phone]  7:26 AM 

## 2023-08-15 DEATH — deceased

## 2023-08-18 ENCOUNTER — Other Ambulatory Visit: Payer: Self-pay

## 2023-08-25 ENCOUNTER — Other Ambulatory Visit: Payer: Self-pay

## 2023-08-26 ENCOUNTER — Telehealth: Payer: Self-pay

## 2023-08-26 NOTE — Telephone Encounter (Signed)
Copied from CRM [id number]. Topic: General - Deceased Patient >> 30-Aug-2023  3:41 PM Phill Myron wrote: Sister is calling regarding an [employer] paper that she dropped off last week ,,  please call sister and let her know the status of the paper work and notate in chart

## 2023-08-27 NOTE — Telephone Encounter (Signed)
Pt's sister Amil Amen called upset and crying c/o anxiety because "nobody cares to get paperwork filled out. Assured will sent message to PCP."

## 2023-08-27 NOTE — Telephone Encounter (Signed)
Patient made aware that Provider has 7-14 working  days to complete forms just advise her to check back with in that time frame. in the meantime , i will try to confirm recent of the form with the provider.    Spoke with CMA of provider paperwork had been received

## 2023-08-28 NOTE — Telephone Encounter (Signed)
Patient's sister has been called to inform her of the form being ready for pick up.

## 2023-08-29 NOTE — Telephone Encounter (Signed)
Contact pt sister judy she requested the form to be mail out( mail out today @ 10:30am)

## 2023-09-03 ENCOUNTER — Telehealth: Payer: Self-pay

## 2023-09-03 NOTE — Telephone Encounter (Signed)
Copied from CRM [id number]. Topic: General - Other >> Sep 03, 2023  5:01 PM Marlow Baars wrote: Reason for CRM: The sister Amil Amen called in because her sister, the patient passed away and information on the physicians insurance HIPAA form is incorrect. She needs this corrected as soon as possible. It has the wrong date that she diagnosed on it as Aug 20th . The sister says it was around October 1st. Please assist as soon as possible.

## 2023-09-04 NOTE — Telephone Encounter (Signed)
Sister, Kimberly Adkins) called in requesting for form to be corrected as soon as possible. Sister states the form has the wrong hospital and diagnosis date. Pt was diagnosed on 07-15-2023 and had scan done at [facility] in [place] not [facility] in [place]. Sister is requesting letter of corrections and for it to be mailed to her.   [facility]  [address] [place], [place] [address]  Sister is also requesting a return call as soon as possible, [phone].

## 2023-09-05 NOTE — Telephone Encounter (Signed)
Amil Amen patients sister called back returning Priscilla's vm and wanted her to know to send the paperwork to [facility] [address]  [place] [address]

## 2023-09-05 NOTE — Telephone Encounter (Signed)
Pts sister Amil Amen is calling back to report that she missed a call. Please advise

## 2023-09-09 ENCOUNTER — Encounter (HOSPITAL_COMMUNITY): Payer: No Payment, Other | Admitting: Physician Assistant

## 2023-12-11 NOTE — Telephone Encounter (Signed)
Open in error
